# Patient Record
Sex: Female | Born: 1935 | Race: White | Hispanic: No | Smoking: Former smoker
Health system: Southern US, Community
[De-identification: ages and names within clinical notes are randomized; demographics above are authoritative.]

## PROBLEM LIST (undated history)

## (undated) DIAGNOSIS — K2970 Gastritis, unspecified, without bleeding: Secondary | ICD-10-CM

## (undated) DIAGNOSIS — E041 Nontoxic single thyroid nodule: Secondary | ICD-10-CM

## (undated) DIAGNOSIS — Z8679 Personal history of other diseases of the circulatory system: Secondary | ICD-10-CM

## (undated) DIAGNOSIS — I639 Cerebral infarction, unspecified: Secondary | ICD-10-CM

## (undated) DIAGNOSIS — J189 Pneumonia, unspecified organism: Secondary | ICD-10-CM

## (undated) DIAGNOSIS — J449 Chronic obstructive pulmonary disease, unspecified: Secondary | ICD-10-CM

## (undated) DIAGNOSIS — F419 Anxiety disorder, unspecified: Secondary | ICD-10-CM

## (undated) DIAGNOSIS — F329 Major depressive disorder, single episode, unspecified: Secondary | ICD-10-CM

## (undated) DIAGNOSIS — I10 Essential (primary) hypertension: Secondary | ICD-10-CM

## (undated) DIAGNOSIS — F32A Depression, unspecified: Secondary | ICD-10-CM

## (undated) DIAGNOSIS — R51 Headache: Secondary | ICD-10-CM

## (undated) HISTORY — PX: THYROID SURGERY: SHX805

## (undated) HISTORY — PX: OOPHORECTOMY: SHX86

## (undated) HISTORY — PX: CHOLECYSTECTOMY: SHX55

## (undated) HISTORY — PX: APPENDECTOMY: SHX54

## (undated) HISTORY — PX: HERNIA REPAIR: SHX51

---

## 1998-12-22 ENCOUNTER — Encounter: Payer: Self-pay | Admitting: Internal Medicine

## 1998-12-22 ENCOUNTER — Ambulatory Visit (HOSPITAL_COMMUNITY): Admission: RE | Admit: 1998-12-22 | Discharge: 1998-12-22 | Payer: Self-pay | Admitting: Internal Medicine

## 1999-01-03 ENCOUNTER — Observation Stay (HOSPITAL_COMMUNITY): Admission: RE | Admit: 1999-01-03 | Discharge: 1999-01-04 | Payer: Self-pay | Admitting: *Deleted

## 1999-04-04 ENCOUNTER — Other Ambulatory Visit: Admission: RE | Admit: 1999-04-04 | Discharge: 1999-04-04 | Payer: Self-pay | Admitting: Internal Medicine

## 2000-05-10 ENCOUNTER — Emergency Department (HOSPITAL_COMMUNITY): Admission: EM | Admit: 2000-05-10 | Discharge: 2000-05-10 | Payer: Self-pay | Admitting: Emergency Medicine

## 2000-05-10 ENCOUNTER — Encounter: Payer: Self-pay | Admitting: Emergency Medicine

## 2000-09-22 ENCOUNTER — Encounter: Admission: RE | Admit: 2000-09-22 | Discharge: 2000-09-22 | Payer: Self-pay | Admitting: Internal Medicine

## 2000-09-22 ENCOUNTER — Encounter: Payer: Self-pay | Admitting: Internal Medicine

## 2000-10-01 ENCOUNTER — Ambulatory Visit (HOSPITAL_COMMUNITY): Admission: RE | Admit: 2000-10-01 | Discharge: 2000-10-01 | Payer: Self-pay | Admitting: *Deleted

## 2000-10-01 ENCOUNTER — Encounter (INDEPENDENT_AMBULATORY_CARE_PROVIDER_SITE_OTHER): Payer: Self-pay | Admitting: Specialist

## 2000-11-07 ENCOUNTER — Encounter (INDEPENDENT_AMBULATORY_CARE_PROVIDER_SITE_OTHER): Payer: Self-pay | Admitting: Specialist

## 2000-11-07 ENCOUNTER — Ambulatory Visit (HOSPITAL_BASED_OUTPATIENT_CLINIC_OR_DEPARTMENT_OTHER): Admission: RE | Admit: 2000-11-07 | Discharge: 2000-11-07 | Payer: Self-pay | Admitting: *Deleted

## 2001-04-20 ENCOUNTER — Other Ambulatory Visit: Admission: RE | Admit: 2001-04-20 | Discharge: 2001-04-20 | Payer: Self-pay | Admitting: Internal Medicine

## 2007-06-02 ENCOUNTER — Encounter: Admission: RE | Admit: 2007-06-02 | Discharge: 2007-06-02 | Payer: Self-pay | Admitting: Internal Medicine

## 2008-02-04 ENCOUNTER — Encounter: Admission: RE | Admit: 2008-02-04 | Discharge: 2008-03-01 | Payer: Self-pay | Admitting: Internal Medicine

## 2008-08-24 ENCOUNTER — Inpatient Hospital Stay (HOSPITAL_COMMUNITY): Admission: EM | Admit: 2008-08-24 | Discharge: 2008-08-29 | Payer: Self-pay | Admitting: Emergency Medicine

## 2008-08-24 ENCOUNTER — Ambulatory Visit: Payer: Self-pay | Admitting: Cardiology

## 2008-08-25 ENCOUNTER — Encounter (INDEPENDENT_AMBULATORY_CARE_PROVIDER_SITE_OTHER): Payer: Self-pay | Admitting: Internal Medicine

## 2008-08-26 ENCOUNTER — Ambulatory Visit: Payer: Self-pay | Admitting: Physical Medicine & Rehabilitation

## 2008-08-26 ENCOUNTER — Encounter (INDEPENDENT_AMBULATORY_CARE_PROVIDER_SITE_OTHER): Payer: Self-pay | Admitting: Internal Medicine

## 2008-08-29 ENCOUNTER — Inpatient Hospital Stay (HOSPITAL_COMMUNITY)
Admission: RE | Admit: 2008-08-29 | Discharge: 2008-09-20 | Payer: Self-pay | Admitting: Physical Medicine & Rehabilitation

## 2008-08-29 ENCOUNTER — Ambulatory Visit: Payer: Self-pay | Admitting: Physical Medicine & Rehabilitation

## 2008-09-20 ENCOUNTER — Encounter: Payer: Self-pay | Admitting: Internal Medicine

## 2009-01-23 ENCOUNTER — Encounter: Admission: RE | Admit: 2009-01-23 | Discharge: 2009-03-16 | Payer: Self-pay | Admitting: Internal Medicine

## 2010-03-17 ENCOUNTER — Inpatient Hospital Stay (HOSPITAL_COMMUNITY): Admission: EM | Admit: 2010-03-17 | Discharge: 2010-03-21 | Payer: Self-pay | Admitting: Emergency Medicine

## 2010-03-17 ENCOUNTER — Ambulatory Visit: Payer: Self-pay | Admitting: Internal Medicine

## 2010-04-02 ENCOUNTER — Ambulatory Visit (HOSPITAL_COMMUNITY): Admission: RE | Admit: 2010-04-02 | Discharge: 2010-04-02 | Payer: Self-pay | Admitting: Internal Medicine

## 2010-09-16 HISTORY — PX: KYPHOPLASTY: SHX5884

## 2010-10-07 ENCOUNTER — Encounter: Payer: Self-pay | Admitting: Physical Medicine & Rehabilitation

## 2010-12-02 LAB — CLOSTRIDIUM DIFFICILE EIA: C difficile Toxins A+B, EIA: NEGATIVE

## 2010-12-02 LAB — URINE CULTURE: Culture: NO GROWTH

## 2010-12-02 LAB — CBC
HCT: 32.2 % — ABNORMAL LOW (ref 36.0–46.0)
HCT: 34.6 % — ABNORMAL LOW (ref 36.0–46.0)
Hemoglobin: 10.6 g/dL — ABNORMAL LOW (ref 12.0–15.0)
Hemoglobin: 11.1 g/dL — ABNORMAL LOW (ref 12.0–15.0)
MCH: 30.5 pg (ref 26.0–34.0)
MCHC: 32.7 g/dL (ref 30.0–36.0)
MCHC: 32.9 g/dL (ref 30.0–36.0)
MCV: 92.9 fL (ref 78.0–100.0)
MCV: 93.4 fL (ref 78.0–100.0)
Platelets: 155 10*3/uL (ref 150–400)
RBC: 3.45 MIL/uL — ABNORMAL LOW (ref 3.87–5.11)
RBC: 3.58 MIL/uL — ABNORMAL LOW (ref 3.87–5.11)
RDW: 12.7 % (ref 11.5–15.5)
RDW: 13.1 % (ref 11.5–15.5)

## 2010-12-02 LAB — HEPATIC FUNCTION PANEL
ALT: 17 U/L (ref 0–35)
Bilirubin, Direct: 0.2 mg/dL (ref 0.0–0.3)
Indirect Bilirubin: 0.2 mg/dL — ABNORMAL LOW (ref 0.3–0.9)
Total Protein: 6.7 g/dL (ref 6.0–8.3)

## 2010-12-02 LAB — COMPREHENSIVE METABOLIC PANEL
ALT: 16 U/L (ref 0–35)
AST: 33 U/L (ref 0–37)
Albumin: 2.7 g/dL — ABNORMAL LOW (ref 3.5–5.2)
Alkaline Phosphatase: 46 U/L (ref 39–117)
Alkaline Phosphatase: 56 U/L (ref 39–117)
BUN: 7 mg/dL (ref 6–23)
CO2: 28 mEq/L (ref 19–32)
Creatinine, Ser: 1.09 mg/dL (ref 0.4–1.2)
GFR calc non Af Amer: >60 mL/min (ref >60)
Glucose, Bld: 72 mg/dL (ref 70–99)
Potassium: 4.6 mEq/L (ref 3.5–5.1)
Potassium: 5 mEq/L (ref 3.5–5.1)
Total Protein: 6.1 g/dL (ref 6.0–8.3)

## 2010-12-02 LAB — CULTURE, BLOOD (ROUTINE X 2): Culture: NO GROWTH

## 2010-12-02 LAB — BASIC METABOLIC PANEL
BUN: 13 mg/dL (ref 6–23)
Calcium: 8.8 mg/dL (ref 8.4–10.5)
Chloride: 98 mEq/L (ref 96–112)
GFR calc Af Amer: >60 mL/min (ref >60)
GFR calc non Af Amer: 51 mL/min — ABNORMAL LOW (ref >60)
Glucose, Bld: 110 mg/dL — ABNORMAL HIGH (ref 70–99)
Potassium: 3 mEq/L — ABNORMAL LOW (ref 3.5–5.1)

## 2010-12-02 LAB — URINALYSIS, ROUTINE W REFLEX MICROSCOPIC
Hgb urine dipstick: NEGATIVE
Nitrite: NEGATIVE
Specific Gravity, Urine: 1.015 (ref 1.005–1.030)
Urobilinogen, UA: 0.2 mg/dL (ref 0.0–1.0)
pH: 6.5 (ref 5.0–8.0)

## 2010-12-02 LAB — DIFFERENTIAL
Basophils Relative: 0 % (ref 0–1)
Eosinophils Absolute: 0 10*3/uL (ref 0.0–0.7)
Eosinophils Relative: 1 % (ref 0–5)
Lymphocytes Relative: 27 % (ref 12–46)
Lymphs Abs: 1.5 10*3/uL (ref 0.7–4.0)
Neutro Abs: 3.2 10*3/uL (ref 1.7–7.7)

## 2010-12-02 LAB — STOOL CULTURE

## 2010-12-02 LAB — GIARDIA/CRYPTOSPORIDIUM SCREEN(EIA)
Cryptosporidium Screen (EIA): NEGATIVE
Giardia Screen - EIA: NEGATIVE

## 2010-12-31 LAB — BASIC METABOLIC PANEL
Calcium: 9.2 mg/dL (ref 8.4–10.5)
Creatinine, Ser: 0.93 mg/dL (ref 0.4–1.2)
GFR calc Af Amer: >60 mL/min (ref >60)
GFR calc non Af Amer: 59 mL/min — ABNORMAL LOW (ref >60)
Sodium: 137 mEq/L (ref 135–145)

## 2011-01-29 NOTE — H&P (Signed)
Erica French, Erica French NO.:  [id number]   MEDICAL RECORD NO.:  [id number]          PATIENT TYPE:  IPS   LOCATION:  4033                         FACILITY:  [facility]   PHYSICIAN:  Ellwood Dense, M.D.   DATE OF BIRTH:  04-09-1930   DATE OF ADMISSION:  08/29/2008  DATE OF DISCHARGE:                              HISTORY & PHYSICAL   PRIMARY CARE PHYSICIAN:  Dr. Earl Gala.   CARDIOLOGIST:  Dr. Mayford Knife.   HISTORY OF THE PRESENT ILLNESS:  Erica French is a 77 year old Caucasian  female with history of hypertension and temporal arteritis.  She was  admitted August 24, 2008, after a fall backwards without loss of  consciousness.  She does have a history of repeat falls over the past 6  or so months evaluated as an outpatient.  On admission, cranial CT was  done and showed no acute changes.  MRI and MRA study of the brain showed  an acute ischemic infarct in the right corona radiata extending to the  posterior half of the right putamen with inflammation of the sinuses  which was stable.  There was no stenosis or aneurysms noted.  X-rays of  the coccyx were negative for fracture.  Lumbar spine and cervical CAT  scan showed spondylolisthesis at L3-L4, L4-L5, and C4-C5.  There was  also spondylosis of the lumbar and cervical spines noted.  Neurology was  consulted for input and recommended Plavix for stroke prophylaxis.  Carotid Dopplers showed no significant internal carotid artery stenosis.  A 2D echocardiogram showed an ejection fraction of 55% to 60% with mild  mitral valve regurgitation and calcified papillary nodule.   The patient continues with left hemiparesis.  She is noted to have  dysphagia with left pocketing and is placed on a puree diet with thin  liquids.  She has complaints of suprapubic pain questionably secondary  to urinary tract infection and was recently started on ciprofloxacin for  treatment.  Her blood pressure remains labile with continuation of  Florinef and blood pressure medicines as needed.   Chest x-ray August 24, 2008, showed cardiomegaly without pulmonary  edema with healed right rib fracture and compression fractures of T7,  T8, and T9 of questionable age.   The patient was evaluated by the rehabilitation physicians and felt to  be an appropriate candidate for inpatient rehabilitation.   REVIEW OF SYSTEMS:  Positive for shortness of breath and dyspnea on  exertion, weakness, numbness, and sacral and pelvic pain.   PAST MEDICAL HISTORY:  1. Hypertension.  2. Temporal arteritis.  3. Osteoporosis.  4. TIAs.  5. Depression.  6. 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  7. Colon resection for obstruction.  8. COPD per x-rays.  9. History of syncope.  10.Prior cholecystectomy.  11.Left unilateral oophorectomy.  12.Partial thyroidectomy.   FAMILY HISTORY:  Positive for coronary artery disease.   SOCIAL HISTORY:  [place]-  level home with 3 steps to enter.  The patient's family says that they  will be checking in on her but she will be mostly by herself.  She quit  tobacco 2 to 3 years ago after 50-year pack history and does not use  alcohol.   FUNCTIONAL HISTORY PRIOR TO ADMISSION:  Independent with a cane prior to  admission, rather sedentary with balance deficits that did not improve  with short-term balance therapy in summer of 2009 and were stopped  secondary to low blood pressure.   PRESENT FUNCTIONAL STATUS:  Patient presently requires mod assist for  lower extremity, trunk, and leg movement.  She requires max the total  assist leaning to the left of her upper body, dressing, and total assist  for lower body dressing secondary to decreased balance.  She uses max  assist for grooming and mod assist for eating.  She requires mod to max  assist at present for toilet transfers and is continent of bladder.   MEDICATIONS PRIOR TO ADMISSION:  1. Florinef 0.1 mg  daily.  2. Aspirin 81 mg daily.  3. Fish oral daily.  4. Prednisone 3 mg daily.  5. Lisinopril 10 mg daily.  6. Alendronate 70 mg weekly.  7. Celexa 40 mg daily.   ALLERGIES:  1. CODEINE.  2. PROZAC.   LABORATORY:  Most recent hemoglobin was 9.2 with hematocrit of 28.1,  platelet count of 140,000, and white count of 5.3.  Most recent sodium  was 136, potassium 3.9, chloride 106, bicarbonate 24, BUN 5, creatinine  0.57, glucose of 90.  Total cholesterol was 169 with HDL cholesterol 77,  LDL cholesterol of 86, and triglycerides of 31.  Vitamin B12 was normal  at 803 with TSH of 2.144 and erythrocyte sedimentation rate 22.  Recent  hemoglobin A1c was 5.8.   PHYSICAL EXAM:  Reasonably well-appearing, thin, adult female lying in  bed in no acute discomfort.  Blood pressure was 177/86 with pulse of 89,  respiratory rate 20, and temperature 98.1.  Height 5 feet 6 inches.  She  does wear a splint on her left upper extremity.  HEENT:  Normocephalic, nontraumatic.  CARDIOVASCULAR:  Regular rate and rhythm.  S1 and S2 without murmurs.  ABDOMEN:  Soft, nontender with positive bowel sounds.  LUNGS:  Clear to auscultation bilaterally.  NEUROLOGIC:  Alert and oriented x3.  Cranial nerves II-XII are intact  with only minimal facial weakness on the left.  Patient has good  receptive and expressive abilities.  Right upper and right lower  extremity exam showed 4+/5 strength throughout.  Bulk and tone were  normal and reflexes were 2+ and symmetrical.  Left upper extremity  strength was 0 to 1/5 with a splint in place.  Sensation was decreased  to light touch in the left upper extremity compared to the right upper  extremity.  Left lower extremity exam showed hip flexion, knee  extension, and ankle dorsiflexion at 3+ to 4-/5.  Bulk and tone were  normal.   DIAGNOSES:  1. Status post right corona radiata infarct with left hemiparesis,      upper extremity worse than left lower extremity.  2.  Left facial weakness with hemisensory deficit.  3. Dysphasia.  4. Mild dysarthria.   Presently, the patient will be admitted to receive collaborative and  interdisciplinary care between the physiatrist, rehab nursing staff, and  therapy team at least 3 hours per day at least 5 days per week.  Patient's level of medical complexity and substantial therapy needs in  context of that medical necessity cannot be provided at a lesser  intensity of care.  Patient has experienced substantial functional loss  from her baseline.  Presently, she requires mod to max assist for ADLs  and transfers.  Hopefully with ongoing aggressive therapies she will  make measurable gains such that she will be able to return home probably  at min assist level.  She will probably need some 24-hour assistance at  home as I doubt she would be at modified independent level when she  first returns home.  Physiatrist will provide 24-hour management of the  medical needs, as well as oversight of the therapy plan/treatment and  provide guidance as appropriate guarding and interaction of the two.  A  24-hour rehab nursing will assist in management of bowel and bladder and  help integrate therapy concepts, techniques, and education.  Physical  therapy will assess and treat for range of motion, strengthening, bed  mobility, transfers, pre-gait training, gait training, and equipment  eval with goals of min to mod assist for ambulation and min assist for  transfers.  Occupational therapy will assess and treat for range of  motion, strengthening, ADLs, cognitive/perceptional training, splinting  and equipment eval with goals of min assist.  Speech therapy will assess  and treat for dysphagia, oral motor exercises, communication aids,  aphasia, and  VitalStim with goals of modified independent.  Case  management and social worker will assess and treat for psychosocial  issues and discharge planning as appropriate.  Team  conferences will be  held weekly to establish goals, assess progress, and determine barriers  to discharge.   ESTIMATED LENGTH OF STAY:  15 to 25 days.   PROGNOSIS:  Fair/good.   PROBLEM LIST:  1. Hypertension with continuation of Prinivil and use of Florinef at      0.1 mg daily with use of abdominal binder and adjustment of      medications as necessary to maintain adequate blood pressure but      avoid hypertension.  2. Continued anemia with monitoring of stool guaiacs and addition of      iron supplement as needed.  3. History of syncope with orthostasis, presently on Florinef with      addition of abdominal binder.  4. Questionable urinary tract infection, presently on Cipro with      sensitivities pending with change to appropriate antibiotic as      necessary.  5. Chronic obstructive pulmonary disease, on metered-dose inhaler for      dyspnea and wheezing.           ______________________________  Ellwood Dense, M.D.     DC/MEDQ  D:  08/29/2008  T:  08/29/2008  Job:  [id number]

## 2011-01-29 NOTE — H&P (Signed)
NAMESHALINI, MAIR NO.:  [id number]   MEDICAL RECORD NO.:  [id number]          PATIENT TYPE:  EMS   LOCATION:  ED                           FACILITY[facility]  [facility]   PHYSICIAN:  Michiel Cowboy, MDDATE OF BIRTH:  Sep 28, 1929   DATE OF ADMISSION:  08/24/2008  DATE OF DISCHARGE:                              HISTORY & PHYSICAL   PRIMARY CARE Clarance Bollard:  Theressa Millard, M.D.   The patient is a 77 year old female with history of degenerative disk  disease, osteoporosis, hypertension and temporal arteritis; as well as  past history of TIAs.  The patient was at her baseline of health up  until about 2:00 p.m. today.  She was standing in the kitchen preparing  lunch; she turned around to get to her chair and while turning suffered  a fall, landing on her back -- particularly on her bottom and her head.  She said when she hit the floor she was able to move everything; had no  difficulty with getting herself up.  She got up, went to fix herself  lunch, ate, and then got up again to sit down.  She then got up again to  get herself to get herself a glass of water, and on the way over all of  a sudden lost the use of her left leg -- which caused her to fall down  again.  Thereafter she could not get up.  Her left arm as well was  feeling weak; at which point her family came and brought her into the  emergency department.  Initially a code stroke was called, but since the  symptoms started at 2:30 p.m. and the patient was only here at 6 p.m.,  she was out of the window of treatment.  Neurology referred for internal  medicine to admit the patient.  Per their recommendation, will have a  stroke workup and change her aspirin to Plavix; and also order PT, OT.  I think that this is likely small vessel ischemic disease.  Given mild  symptoms, also the decision was made to not proceed to TPA.   REVIEW OF SYSTEMS:  Otherwise, the review of systems:  The patient still  feels kind of  woozy and not like herself.  She is still having slightly  weakness of her left leg. left arm and a left facial droop.  Otherwise  no nausea, no vomiting.  No chest pain or shortness of breath.  She has  a lot of back pain where she fell, otherwise unremarkable.   PAST MEDICAL HISTORY:  1. Hypertension.  2. Osteoporosis.  3. History of __________  4. Temporal arteritis.   ALLERGIES:  CODEINE, PROZAC.   MEDICATIONS:  1. Alendronate 70 mg q. Week.  2. __________ 40 mg.  3. Prednisone 3 mg daily.  4. Lisinopril 10 mg daily.  5. Midodrine 5 mg daily.  6. Aspirin 81 mg daily.  7. Fish oil one tablet daily.  8. Fludrocort 0.1 mg p.o. daily.  9. Citracal 1 mg p.o. daily.   SOCIAL HISTORY:  The patient continues to smoke one to two packs of  cigarettes per week; but does not drink.  She lives by herself.  Fully  ambulatory prior to this.   FAMILY HISTORY:  Significant for father who died of heart attack at the  age of 62.  Otherwise unremarkable.   PHYSICAL EXAMINATION:  VITAL SIGNS:  Temperature 97.9, pulse 71,  respirations 20, blood pressure initially was 192/104 and now down to  176/104.  The patient currently appears to be in no acute distress,  sitting down in bed.  HEENT:  Head -- There is a large hematoma on the occipital portion on  the right occipital portion; otherwise somewhat dry mucous membranes.  LUNGS:  Somewhat distant breath sounds bilaterally, with occasional  crackles.  HEART:  Regular rate and rhythm.  No murmurs, rubs or  gallops.  ABDOMEN: Soft, nontender, nondistended.  LOWER EXTREMITIES:  Without clubbing, cyanosis or edema.  MUSCULOSKELETAL:  Tenderness over coccyx noted.  NEUROLOGIC:  Strength 5/5 on the right and 4/5 on the left; with mild  hand drift and also diminished hand to eye coordination on the left.  There was also mild left facial droop.  Cranial nerves otherwise intact.   LABS:  White blood cell count 7.6, hemoglobin 11.7.  Sodium 137,   potassium 4.0, creatinine 1.64, calcium 9.6.   STUDIES:  CT scan of the head showing no acute intracranial changes,  atrophy and chronic ischemic changes noted.  There is a right parietal  scalp contusion noted.  The CT scan of the neck with no fractures.  Lumbar spine showing degenerative changes, but otherwise no fractures.   EKG:  Not obtained.   CHEST X-RAY:  Not obtained.   ASSESSMENT AND PLAN:  This is a 77 year old female with a history of  transient ischemic attacks, hypertension, and temporal arteritis; who  presents with a sudden left-sided weakness and left facial droop.  According to neurology, likely cerebrovascular accident.  We will admit  for further observation and evaluation.   1. Likely cerebrovascular accident:  We will do cerebrovascular      accident workup, including MRI, MRA 2-D echocardiogram, carotid      Dopplers, homocysteine level, fasting lipid panel, hemoglobin A1c.      Will switch to Plavix.  2. Hypertension:  Will avoid over treatment currently, as the patient      is still within 24 hours after potential cerebrovascular accident .      Will decrease dose of lisinopril and give p.r.n. hydralazine for      systolics above 180.  3. Status post Fall:  Sounds like it was mechanical fall due to losing      balance when she turned around.  The patient did not lose      consciousness, but did have head trauma.  She is currently still      kind of feeling woozy, and this could be either related to her      stroke or concussion.  She will need PT and OT evaluation.  We will      do fall precautions.  Evaluate coccyx for possible fractures, as      this patient still has pain and tenderness.  4. Slightly Elevated Creatinine:  Will give gentle IV hydration and      follow.  5. History of Temporal Arteritis:  Will continue prednisone.  6. History of Depression:  Continue citalopram.  7. Prophylaxis:  Protonix plus SCDs.   The patient is Full Code, as per  wishes of patient and her family.  Michiel Cowboy, MD  Electronically Signed     AVD/MEDQ  D:  08/24/2008  T:  08/24/2008  Job:  [id number]   cc:   Theressa Millard, M.D.  Fax: [phone]

## 2011-01-29 NOTE — Discharge Summary (Signed)
[name], [name] NO.:  [id number]   MEDICAL RECORD NO.:  [id number]          PATIENT TYPE:  INP   LOCATION:  1423                         FACILITY:  [facility]   PHYSICIAN:  Theressa Millard, M.D.    DATE OF BIRTH:  July 02, 1930   DATE OF ADMISSION:  08/24/2008  DATE OF DISCHARGE:  08/29/2008                               DISCHARGE SUMMARY   ADMITTING DIAGNOSIS:  Stroke.   DISCHARGE DIAGNOSES:  1. Stroke with subcortical stroke in the right corona radiology      extending into the posterior half of the right putamen, left arm      plegia, left leg paresis, and left facial droop as a result.  2. History of temporal arteritis with normal sedimentation rate.  3. Probable urinary tract infection.  4. History of orthostatic hypotension, treated with fludrocortisone      and midodrine.  5. Supine hypertension, treated with lisinopril 10 mg daily, now on      hold.  6. Osteoporosis, status post 2 years of Forteo, currently on      alendronate.   The patient is a 77 year old white female who fell on the day of  admission.  She thought this was due to orthostatic hypotension.  She  struck the right occiput and fell on the coccyx as well.  She sat for  several hours and thought she was fine but when she got up to walk again  she realized that her left leg was weak.  She came to the emergency  department.  In the emergency department, she was seen by neurology and  medicine.  Neurology noted that she had some mild left facial droop,  thought that she probably had a subcortical stroke that was small in  extent and did not recommend TPA.  It was their feeling that because of  the patient's head trauma, subcortical location of her neurologic  deficit, and other issues, that it would be best not to consider TPA  with the dangers probably outlying benefits.  Unfortunately, over night,  she worsened and developed plegia of the left arm and severe paresis of  the left leg.  She  developed a worsening left facial droop.  Over the  subsequent days, her left arm continued to be extremely weak but she did  regain some function in her left leg.  The left facial droop remained.  Modified barium swallow revealed some difficulties with thin liquids and  pills so she was trained in eating safely in that regard.  She was  treated with aspirin.  Her lisinopril was held in order to allow her  blood pressure to maintain itself in the 160 to 170 range.  She did not  have problems with orthostatic hypotension but she never really did get  out of bed.   She is transferred to the comprehensive inpatient rehab unit for  complete and thorough rehabilitation.  Patient has a very supportive  family including 4 daughters who were involved in her care during the  hospitalization.  She should do very well in rehab and regain  considerable functional status.  The patient had a Foley catheter for several days.  This was  discontinued.  She had urinary frequency and suprapubic pain.  Urinalysis was negative but she improved on Cipro while the urinalysis  was being done.  Therefore, she will be treated for 5 days with Cipro.   DISCHARGE MEDICATIONS:  1. Alendronate 70 mg weekly.  2. Citalopram 40 mg daily.  3. Prednisone 3 mg daily.  4. Fludrocortisone 0.1 mg daily.  5. Midodrine 2.5 mg b.i.d.  6. Citracal twice daily.  7. Aspirin 81 mg daily.  8. Fish oil 1000 mg daily.  9. Cipro 500 mg b.i.d. x4 days.      Theressa Millard, M.D.  Electronically Signed     JO/MEDQ  D:  08/29/2008  T:  08/29/2008  Job:  [id number]

## 2011-01-29 NOTE — Consult Note (Signed)
Erica French, CARUSONE NO.:  [id number]   MEDICAL RECORD NO.:  [id number]          PATIENT TYPE:  EMS   LOCATION:  ED                           FACILITY:  [facility]   PHYSICIAN:  Levert Feinstein, MD               DATE OF BIRTH:   DATE OF CONSULTATION:  08/24/2008  DATE OF DISCHARGE:                                 CONSULTATION   REFERRING PHYSICIAN:  Carleene Cooper, M.D.   CHIEF COMPLAINT:  1. Fall.  2. Left-sided weakness.   HISTORY OF PRESENT ILLNESS:  The patient is a 77 year old right-handed  Caucasian female, with 2 daughters at the bedside.  She has a past  medical history of orthostatic hypotension, frequent falls since last  year, hypertension and osteoporosis.  A previous history of temporal  arteritis, on tapering dose of prednisone.  She presented with a fall x2  this afternoon.   The patient was alone at home this afternoon.  About 2:15 pm, she got up  from seated position, walked towards her cabinet; without any warning  signs, she fell backwards.  Without loss of consciousness, but she was  able to get up without lateralized motor or sensory deficit.  She  finished her lunch.  When she got up to put her dish into the kitchen,  she fell again.  This time no loss of consciousness, but she has  difficulty getting up.  She complains of severe low back pain.  She had  difficult time bearing weight with her left leg and mild slurred speech.  She was able to manage to call her daughter, who later took her to the  emergency room.   Symptoms have persisted since onset.  The family noticed mild drooping  on the left face;   She described this fall accident was different from previous hypotensive  episodes.  With her orthostatic hypotensive episodes she would have  warning signs, dizziness and woozy sensation, vision blackout with  transient loss of consciousness.  This is totally different.  She  complains of severe low back pain, mild numbness on the left leg  and  foot; but, no numbness on the left trunk, arm or face.  She denies urine  or bowel incontinence or pelvic area numbness.   REVIEW OF SYSTEMS:  Pertinent as above.   PAST MEDICAL HISTORY:  1. Hypertension.  2. Orthostatic hypotension.  3. Osteoporosis.  4. Previous history of temporal arteritis, on chronic prednisone      treatment for years.   PAST SURGICAL HISTORY:  None.   FAMILY HISTORY:  Noncontributory.   SOCIAL HISTORY:  She lives alone.  Quit driving since last year.  Sedentary lifestyle due to frequent orthostatic symptoms.  She was not  able to tolerate PT, OT previously.  She smokes occasionally.  Denied  drinking.   CURRENT HOME MEDICATIONS:  1. Alendronate 70 mg.  2. Celexa 40 mg once a day.  3. Prednisone 3 mg once a day.  4. Lisinopril 10 mg once a day.  5. Midodrine 5 mg once a day.  6. Aspirin  81 mg once a day.  7. Fish oil one tablet.  8. Fludrocortisone once a day.  9. Citracal calcium.   PHYSICAL EXAMINATION:  She is a pleasant thin lady; awake, alert,  oriented to history.  Taking care of conversation.  CARDIAC:  Regular rate and rhythm.  PULMONARY:  Clear to auscultation bilaterally.  NECK:  Supple.  No carotid bruits.   NEUROLOGIC EXAMINATION:  She is alert, oriented to history and taking  care of conversation.  Cranial nerves II-XII.  Pupil equal, round,  reactive to light.  Left fundi were sharp.  Extraocular movements were  full.  Visual fields were full on confrontational test.  Facial  sensation was normal.  She has mild left lower face weakness.  The  uvula, tongue midline.  Head turning and shoulder shrugging were normal  and symmetric.   SENSORY:  Intact to light touch, pinprick, vibratory sensation and  proprioception on 4 extremities.  Deep tendon reflexes were brisk and  symmetric, including bilateral act as reflex.  Plantar responses were  flexor.  MOTOR EXAMINATION:  She has mild drifting of the left upper and  lower  extremity, but strength is at least 5- out of 5.  COORDINATION:  She has mild clumsiness on the left upper and lower extremity on finger-  to-nose, heel-to-shin; proportional to weakness and gait.  She needs  people assistance to get up from a seated position; complains of severe  low back pain, veering to the left side.  Difficulty initiating gait,  especially initiate left side; but when she sits still there was no  trunk titubation.   On CT of the brain without contrast there was mild periventricular white  matter disease.  No acute change.   Also, she complains of low back sacral area tenderness and bilateral  paraspinal muscle tenderness.   ASSESSMENT AND PLAN:  A 77 year old female presenting with acute fall,  with acute left leg motor and arm and face weakness.  Likely this  indicates acute small vessel ischemic disease involving right internal  capsule or subcortical area.  She is at 4 hour 20 minute window now.  I  discussed with the family, and she has mild weakness on left side.  Her  gait limitations  are likely a combination of left leg weakness and  severe low back pain.  We decided not to proceed with IV TPA.  She will  be admitted to the hospital for stroke workup including:   1. MRI of the brain with and without contrast.  2. Ultrasound of carotid artery.  3. Echocardiogram.  4. A stroke lab evaluation, including fasting lipid profile, A1c,      thyroid function CMP, CBC, B12, homocystine      and basic vasculitis workup.  5. Physical therapy and occupational therapy.  6. X-ray of the low back.      Levert Feinstein, MD  Electronically Signed     YY/MEDQ  D:  08/24/2008  T:  08/25/2008  Job:  [id number]

## 2011-01-29 NOTE — Discharge Summary (Signed)
Erica French, Erica French NO.:  [id number]   MEDICAL RECORD NO.:  [id number]          PATIENT TYPE:  IPS   LOCATION:  4033                         FACILITY:  [facility]   PHYSICIAN:  Ellwood Dense, M.D.   DATE OF BIRTH:  12-23-1929   DATE OF ADMISSION:  08/29/2008  DATE OF DISCHARGE:  09/20/2008                               DISCHARGE SUMMARY   DISCHARGE DIAGNOSES:  1. 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  2. Hypertension.  3. History of syncope with orthostasis.  4. Chronic obstructive pulmonary disease.  5. Gait disorder.  6. Temporal arteritis.  7. Recurrent hypokalemia, resolved.   HISTORY OF PRESENT ILLNESS:  Erica French is a 77 year old female with  history of hypertension and temporal arteritis admitted December 2009  with fall backwards without loss of consciousness.  CT of head done  showed no acute abnormality.  MRI/ MRA of brain showed acute ischemic  infarct in right corona radiata to posterior half of right putamen,  inflammatory disease of sinus which was noted to be stable, no stenosis  or aneurysm noted.  X-rays of coccyx were negative for fractures, L  spine and cervical spine.  CT shows spondylolisthesis L3-L4, L4-L5 and  C4-C5 with spondylitic lumbar and cervical spine.  Neuro was consulted  for input and recommended Plavix for CVA prophylaxis.  Carotid Dopplers  done showed no ICA stenosis.  Cardiac echocardiogram done showed EF of  55-60% with mild mitral valve regurgitation and calcified papillary  muscle.  Currently, the patient continues with left hemiparesis, noted  to have dysphasia with less pocketing, and is on pureed diet with thin  liquids.  She did have some complaints of suprapubic pain with question  of UTI and was started on Cipro for treatment.  Currently, the patient's  blood pressures remained labile.  ACE was added for better blood  pressure management.  The patient also noted to be anemic with H and H  at 9.2 and 28.1.  The patient was evaluated by rehab physicians and felt  to be an appropriate candidate for inpatient rehab.   PAST MEDICAL HISTORY:  Significant for:  1. Hypertension.  2. Temporal arteritis.  3. Osteoporosis.  4. TIAs.  5. Depression.  6. Gait disorders with fall.  7. History of colon obstruction with requiring resection.  8. COPD.  9. History of syncope.  10.Left unilateral oophorectomy.  11.Partial thyroidectomy.  12.Cholecystectomy.   ALLERGIES:  To CODEINE and PROZAC.   FAMILY HISTORY:  Is positive for coronary artery disease.   SOCIAL HISTORY:  The patient lives alone in [place], [place], in  one-level home with 3 steps at entry.  Has family that lives nearby.  She quit tobacco 2-3 years ago.  Has 50+ pack-year history.  Does not  use any alcohol.  Family can check in past discharge.   FUNCTIONAL HISTORY:  The patient was independent with cane prior to  admission, is sedentary.  Does not drive.  Balance therapy was attempted  in the Summer of 2009, however, limited  due to the patient's  orthostasis.   FUNCTIONAL STATUS:  OT is working with range of motion left upper  extremity and reinforcement with education of the use of left upper  extremity splint.  The patient is max to total assist for ADLs, mod to  max assist to go from sitting to supine in bed.   PHYSICAL EXAMINATION:  The patient is thin, well-nourished, well-  developed female with left hemiparesis and left facial droop, no acute  distress.  HEENT:  Atraumatic, normocephalic.  Extraocular movements intact.  Nares  patent.  Tongue midline.  NECK:  Is supple without masses or JVD.  LUNGS:  Are clear to auscultation bilaterally without rhonchi.  Audible  upper airway wheezing noted.  HEART:  Was regular rate rhythm without murmurs or gallops.  ABDOMEN:  Is soft, nontender with positive bowel sounds.   MUSCULOSKELETAL:  No edema or peripheral cyanosis noted.  SKIN:  Is intact without evidence of breakdown.  NEUROLOGIC:  Left facial droop with left hemiparesis, dense left upper  extremity flaccid paralysis with left lower extremity weakness -  distally greater than proximally.  The patient's left upper extremity  strength is 0-1/5 with splint in place.  Sensation decreased to light  touch in left upper extremity compared to right upper extremity.  Left  lower extremity exam shows hip flexion, knee extension at 3 and ankle  dorsiflexion at 3+ to 4- out of 5; bulk and tone normal.  The right  upper extremity/right lower extremity strength is at 4+/5 with normal  bulk and tone.  The patient has good receptive and expressive abilities.  The patient is able to follow one and two-step commands without  difficulty.  Some mild anxiety noted.   HOSPITAL COURSE:  [name] was admitted to rehab on August 29, 2008, for inpatient therapies to consist of PT, OT and speech  therapy at least 3 out of 5 days a week.  Past admission, physiatry,  rehab R.N., and therapy team have worked together to provide customized  collaborative interdisciplinary care.  They have monitored the patient  with weekly team conferences to discuss the patient's progress, set  goals, as well as discuss barriers to discharge.  Rehab R.N. has been  working with the patient on bowel and bladder training with toileting  patient q.3 h. for prevention of incontinence.  They have also been  focusing with the patient in reinforcement of the placement of left  upper extremity for helping prevent subluxation of left shoulder and  also been encouraging the patient with p.o. intake.  The patient's blood  pressures have been monitored b.i.d. basis closely with orthostatic  checks due to the patient's history of syncope.  The patient's blood  pressures were noted to drop with standing with supine blood pressure at  150/79  prior to medications.  After medications, supine BP was noted to  be at 121/75 with standing blood pressure at 101/62 with heart rates  elevating from 100 to 113.  The patient was noted to have some shortness  of breath with activity.  However, O2 saturations were noted be at 95%  on room air.  The patient's voiding was monitored with PVR checks, and  the patient was noted be voiding without any signs of retention.  Secondary to issues with orthostasis, an abdominal binder was ordered to  help maintain to the patient's blood pressure.  Florinef has also been  used q.a.m. if systolic blood pressure is less than 160  when supine.  The patient did report not having had a bowel movement for at least a  week since hospitalization.  A bowel program was initiated.  The patient  did receive sorbitol past admission on December 14 which caused some  diarrhea.  Bowel program was set up initially with Senokot-S 2 p.o.  b.i.d.  This was noted to be too much with the patient having some bowel  urgency.  She has had intermittent  issues with constipation, and she is  to continue with Senna S 2 p.o. every night if no BM that day.  Speech  therapy has been following with the patient for diet tolerance with  dysphasia treatment.  The patient was to advanced to D2 chopped diet  with thin liquids on December 14.  Medications have been used whole in  puree.  As the patient's swallow has improved, she was further advanced  to D3 diet with thin liquids on December 22.  She is currently requiring  intermittent supervision at meals.  The patient's labs were checked past  admission showing the patient to have hypokalemia with potassium at 3.3.  She was supplemented with K-Dur.  Additionally, the patient reported  issues with chronic pain due to arthritis.  She was started on Ultram 50  mg p.o. t.i.d. to help with this.  The patient's urine culture came back  without growth.  Therefore, Cipro was deceased on 09/22/2023.  Routine  checks of electrolytes have been done during this stay to monitor the  patient's hypokalemia.  Recheck electrolytes from December 30 revealed  sodium 137, potassium 3.7, chloride 103, CO2 27, BUN 8, creatinine 0.84,  glucose 84.  A CBC on December 30 reveals hemoglobin 10.9, hematocrit  32.4, white count 4.4, platelets 259.  The patient's mood has been  monitored along on Celexa.  High levels of anxiety are much improved by  time of discharge.  The patient's blood pressures continued to be  monitored on b.i.d. basis.  She is noted to be high in a.m. with blood  pressure in 160s in a.m., especially prior to her a.m. medications.  Low-  dose hydrochlorothiazide 12.5 mg was added on January 30.  However, with  addition of hydrochlorothiazide, the patient was noted to drop to 100/60  in therapy with complaints of dizziness on January 4.  The patient's  hydrochlorothiazide was discontinued.  Recommendations are to monitor  the patient's blood pressures in a.m. at supine and while standing prior  to attempting tight control of blood pressures secondary to her history  of orthostasis as well as syncope.   At time of admission, OT evaluation was limited in part due to  orthostasis.  An abdominal binder was ordered to help with her  symptomatology.  OT evaluation notes the patient at mod assist for bed  mobility with decrease in sitting balance with falling to the right,  decrease in truncal stability, problems with left body awareness.  OT  has focused with education on hemi bathing.  Transfer was attempted for  sitting to standing with decreased activation in left lower extremity  with the right knee buckling, with increase in shortness of breath,  decrease in activity tolerance, decrease in frustration tolerance.  The  patient was at total assist for toilet transfer and required increase  max assist with bed mobility with increase in fatigue.  OT has been  focusing with the  patient using hemi-dressing techniques with focus on  truncal support as well as challenge  in dynamic sitting balance at the  edge of bed.  They have also been working with the patient in [employer] with focus on using left upper extremity stabilization and  preventing or decreasing right over compensation.  OT is also working  with the patient on attending to the right side with cuing for memory  strategies for carryover.  Family education was done with family on  December 30 to include issues regarding self-care, standing balance for  lower body clothing management, left upper extremity management and care  as well as shower and toilet transfers.  Physical therapy evaluation at  time of admission revealed the patient with poor posture on standing  with inability to use left upper extremity functionally.  She was max  assist for static sitting with falling posterior and to left with  difficulty recovering and shortness of breath.  She was mod assist to  roll to the right, close supervision to roll to the left, was able to  perform stand pivot transfers with max assist with unable to step on  lower extremity.  She was able to propel her wheelchair 90 feet with 2  rest breaks with close supervision to min assist over a slight lip in  the wall.  Had difficulty staying straight requiring verbal cues for  navigation.  PT has been working with the patient with endurance issues  as well as lower extremity exercises for strengthening, left lower  extremity with mini squats with upper extremity support on bench in  front, facilitation to activity left knee extension as well as  maintaining postural control.  New step has been use with lower  extremity only for strengthening and endurance with the patient able to  perform 5 minutes with multiple rest breaks.  Family education was done  with patient and family with basic transfers with cuing for techniques  and sequencing with the patient  requiring min assist for scoot pivot  transfers.  The patient and family was trained on navigating 3 stairs  with min assist and min to mod assist for car transfers.  At time of  discharge, the patient has progressed along to being at [facility] assist for  sit to stand from wheelchair, able to ambulate 25 feet with handheld  assist with mod assist as she tends to lean to the left with occasional  scissoring of left lower extremity.  She is able to navigate 5 stairs  with 1 rail with mod assist.  She was noted to have a presyncopal  episode with physical therapy on January 4 with fatigue with the patient  requiring frequent rest breaks during the session.  The patient was at  [facility] assist for toilet transfers.  Speech therapy has been working with  the patient on oral motor and pharyngeal strengthening exercises.  They  have also been working with the patient on dysphagia training with  [employer] to see about the patient's tolerance of regular consistency  as well as the patient's ability to utilize swallow strategies for safe  swallow precautions.  They have been monitoring the patient for increase  in divided attention during ADL and transfers for safety and efficiency  of the patient's ability to follow and complete tasks with minimal  cuing.  Initially, the patient required min questioning cues to perform  poor motor exercises.  She was 90-100% accurate with min cuing for  accuracy for complex level tasks with complex problem solving and  reasoning.  Currently, the patient is tolerating  p.o. without  difficulty, following precautions with supervision.  She is able to  perform tasks with moderate distractions with family without requiring  cues to attend for complex reasoning tasks.  After the patient's family  education, family expressed concerns about the amount of care that would  be required.  The patient and family felt that SNF would be the best  option now prior to help transition to  home.  Bed is available at  [facility] for January 5, and the patient is to be discharged to  this facility with progressive PT, OT, speech therapy, continuing past  discharge.   DISCHARGE MEDICATIONS:  1. Prinivil 10 mg p.o. per day.  2. Florinef 0.1 mg, hold if systolic blood pressure is greater than      160.  3. Celexa 40 mg p.o. per day.  4. Protonix 40 mg p.o. per day.  5. Plavix 75 mg per day. 6. Prednisone 3 mg p.o. per day.  7. Combivent inhaler 1 puff b.i.d.  8. Caltrate 950 mg b.i.d.  9. Eucerin cream to bilateral lower extremity b.i.d.  10.K-Dur 20 mEq per day.  11.Senokot-S 2 p.o. every night if no BM that day.  12.Albuterol nebulizers q.i.d. p.r.n. shortness of breath.  13.Atrovent nebulizers q.i.d. p.r.n. shortness of breath, wheezing.  14.Maalox 30 mL p.o. q.4 h. p.r.n. indigestion.  15.Oxycodone 5-10 mg p.o. q.6 h. p.r.n. severe pain.  16.Ultram 50 mg p.o. t.i.d. p.r.n. pain.  17.Robaxin 500 mg p.o. q.i.d. p.r.n. spasm.  18.Artificial Tears 1 GGT q.i.d. p.r.n.   DIET:  Is a regular with intermittent supervision.  Medications whole  with puree, no straws.   ACTIVITY LEVEL:  Is 24-hour supervision with assistance for mobility.  Continue monitoring the patient for orthostatic changes.  Abdominal  binder when out of bed.  Check blood pressures while sitting as well as  lying and standing prior to adjusting and increasing BP medications.   SPECIAL INSTRUCTIONS:  Progressive PT, OT, speech therapy to continue  past discharge.  LMD to monitor for BP medication changes.   FOLLOW UP:  The patient to follow up with Dr. Thomasena Edis in 4-6 weeks.  Follow up with L and D for medical issues.  Follow up with Dr. Pincus Sanes,  neurology, in 4 weeks.      Greg Cutter, P.A.    ______________________________  Ellwood Dense, M.D.    PP/MEDQ  D:  09/19/2008  T:  09/19/2008  Job:  [id number]   cc:   Theressa Millard, M.D.  Maple Hudson, M.D.  Mayford Knife, M.D.

## 2011-04-19 ENCOUNTER — Other Ambulatory Visit: Payer: Self-pay | Admitting: Internal Medicine

## 2011-04-19 ENCOUNTER — Inpatient Hospital Stay (HOSPITAL_COMMUNITY)
Admission: EM | Admit: 2011-04-19 | Discharge: 2011-04-23 | DRG: 516 | Disposition: A | Discharge status: Skilled Nursing Facility | Payer: Medicare Other | Attending: Internal Medicine | Admitting: Internal Medicine

## 2011-04-19 ENCOUNTER — Emergency Department (HOSPITAL_COMMUNITY): Payer: Medicare Other

## 2011-04-19 DIAGNOSIS — IMO0002 Reserved for concepts with insufficient information to code with codable children: Secondary | ICD-10-CM

## 2011-04-19 DIAGNOSIS — Z993 Dependence on wheelchair: Secondary | ICD-10-CM

## 2011-04-19 DIAGNOSIS — M810 Age-related osteoporosis without current pathological fracture: Secondary | ICD-10-CM | POA: Diagnosis present

## 2011-04-19 DIAGNOSIS — M8448XA Pathological fracture, other site, initial encounter for fracture: Principal | ICD-10-CM | POA: Diagnosis present

## 2011-04-19 DIAGNOSIS — M316 Other giant cell arteritis: Secondary | ICD-10-CM | POA: Diagnosis present

## 2011-04-19 DIAGNOSIS — I10 Essential (primary) hypertension: Secondary | ICD-10-CM | POA: Diagnosis present

## 2011-04-19 DIAGNOSIS — M545 Low back pain, unspecified: Secondary | ICD-10-CM

## 2011-04-19 DIAGNOSIS — Z79899 Other long term (current) drug therapy: Secondary | ICD-10-CM

## 2011-04-19 DIAGNOSIS — F3289 Other specified depressive episodes: Secondary | ICD-10-CM | POA: Diagnosis present

## 2011-04-19 DIAGNOSIS — J4489 Other specified chronic obstructive pulmonary disease: Secondary | ICD-10-CM | POA: Diagnosis present

## 2011-04-19 DIAGNOSIS — I672 Cerebral atherosclerosis: Secondary | ICD-10-CM | POA: Diagnosis present

## 2011-04-19 DIAGNOSIS — J449 Chronic obstructive pulmonary disease, unspecified: Secondary | ICD-10-CM | POA: Diagnosis present

## 2011-04-19 DIAGNOSIS — Z66 Do not resuscitate: Secondary | ICD-10-CM | POA: Diagnosis present

## 2011-04-19 DIAGNOSIS — I69959 Hemiplegia and hemiparesis following unspecified cerebrovascular disease affecting unspecified side: Secondary | ICD-10-CM

## 2011-04-19 DIAGNOSIS — F329 Major depressive disorder, single episode, unspecified: Secondary | ICD-10-CM | POA: Diagnosis present

## 2011-04-19 LAB — BASIC METABOLIC PANEL
BUN: 21 mg/dL (ref 6–23)
Chloride: 100 mEq/L (ref 96–112)
Creatinine, Ser: 0.87 mg/dL (ref 0.50–1.10)
GFR calc Af Amer: >60 mL/min (ref >60)
GFR calc non Af Amer: >60 mL/min (ref >60)
Potassium: 5.1 mEq/L (ref 3.5–5.1)

## 2011-04-19 LAB — TYPE AND SCREEN
ABO/RH(D): O POS
Antibody Screen: NEGATIVE

## 2011-04-19 LAB — DIFFERENTIAL
Basophils Absolute: 0 10*3/uL (ref 0.0–0.1)
Basophils Relative: 0 % (ref 0–1)
Lymphocytes Relative: 21 % (ref 12–46)
Neutro Abs: 5.1 10*3/uL (ref 1.7–7.7)
Neutrophils Relative %: 69 % (ref 43–77)

## 2011-04-19 LAB — CBC
HCT: 35.7 % — ABNORMAL LOW (ref 36.0–46.0)
Hemoglobin: 11.6 g/dL — ABNORMAL LOW (ref 12.0–15.0)
MCHC: 32.5 g/dL (ref 30.0–36.0)
WBC: 7.4 10*3/uL (ref 4.0–10.5)

## 2011-04-19 NOTE — H&P (Signed)
Erica French, Erica French NO.:  [id number]  MEDICAL RECORD NO.:  [id number]  LOCATION:  [facility]                         FACILITY:  [facility]  PHYSICIAN:  Rockey Situ. Flavia Shipper., M.D.DATE OF BIRTH:  06/06/1930  DATE OF ADMISSION:  04/19/2011 DATE OF DISCHARGE:                             HISTORY & PHYSICAL   History was available from the patient and her family members in attendance.  She was examined in the emergency room hallway in bed #11.  This is one of several  hospitalizations for this 77 year old woman admitted from an assisted living facility following fall a week ago leading to compression fractures of her lumbar spine.  She is admitted for orthopedic evaluation and pain control.  HISTORY OF PRESENT ILLNESS:  Erica French was in her usual state of ill health until last week when she was been assisted to the bathroom.  She fell against the wall and had pain.  Initial x-rays were nondiagnostic. However, with continued pain she was admitted to the ER today where a CT scan evidenced acute inferior end plate fracture at L2 with retropulsion of 4 mm.  There was also acute anterior superior end plate fracture at L3 with loss of height of 20%, no retropulsion at this level.  She has chronic degenerative changes L3-L4 with multifactorial stenosis, worse on the right.  She has chronic degenerative changes at L4-5 with lateral recess and foraminal narrowing, left worse than right.  In addition, she has evidence of significant atherosclerosis on this x-ray.  PAST MEDICAL HISTORY:  She was admitted to [facility] on July 2001, where it was determined that she had abdominal pain, diarrhea, hypertension, temporal arteritis, a history of a cerebrovascular accident with left hemiparesis, chronic obstructive lung disease, status post cholecystectomy, status post partial thyroidectomy, status post left unilateral oophorectomy and depression.  It was at that  time that she went from home to nursing home care.  In January 2010, she suffered a right corona radiata infarct with left hemipareses, left facial weakness and left hemisensory deficits in attention to the left.  ALLERGIES:  She is allergic to [facility] which causes her to break out in hives.  She is allergic to CODEINE which causes her to break out in hives.  CURRENT MEDICATIONS:  According to the nursing home medication reconciliation include: 1. Plavix 75 mg 1 tablet daily. 2. Citalopram 20 mg 1 tablet daily. 3. Acetaminophen 325 mg every 6 hours. 4. Loperamide 2 mg p.o. 2 tablets daily as needed. 5. Oxycodone 5 mg p.o. every 4 hours as needed. 6. Florastor which is Saccharomyces boulardii 1 cap twice daily. 7. Acetaminophen 500 mg p.o. 2 tablets daily at bedtime. 8. Senna docusate 6.5/50 mg p.o. daily at bedtime. 9. Potassium chloride 1 tablet daily. 10.Oyster Shell Calcium 500 mg daily. 11.Magnesium oxide one daily.  12.She is also on 1 mg of prednisone 3 tablets daily.     All medications will be continued except for the     Saccharomyces boulardii since there may be fungal sepsis     with that medication.  No diabetes, hypertension, rheumatic fever, MI.  She has had a CVA within the last 2 years.  PERSONAL HISTORY:  Smoking 1 pack per day since age 3, she quit with a stroke, however, EtOH is denied.  No travel outside the country.  She has a dog at the nursing home.  She has 4 children, 5 grandchildren and 2 great-grandchildren.  Her husband died of suicide in 15.  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  PHYSICAL EXAMINATION:  VITAL SIGNS:  In the emergency room earlier this afternoon, temperature 97.9, pulse was 85 and regular, blood pressure 139/86, respirations 20, pulse ox was 93%. GENERAL:  Generally, well-developed, well-nourished woman in no apparent distress, except for some twinges of back pain, appearing her stated age. HEENT:   Normocephalic without trauma.  Hair and scalp within normal limits.  Eyes:  Lids within normal limits.  CNS:  Clear.  EOMs intact. PERRLA.  EARS:  Pinna  within normal limits.  Nose:  Septum and mucosa within normal limits.  Mouth:  There are some missing teeth.  Other teeth appear to be intact and not in need of repair immediately. Mucosa:  Posterior pharynx within normal limits. NECK:  Benign with well healed surgical scar consistent with thyroidectomy. CHEST:  Clear anteriorly. ( I did not want to move her given her recent back injury.) CARDIAC:  Normal without murmurs, gallops, arrhythmias or rubs. ABDOMEN:  Soft.  Bowel sounds are present.  No masses, no tenderness. NEURO:  She does have an evident left hemiparesis.  Her left hand does have contractures.  LABS AND DIAGNOSTICS:  CT scan as noted above was reviewed with the radiologist.  She has already received some morphine for pain.  White count was normal.  Hemoglobin was 11.6 mildly low, platelet count was normal.  Sodium 136, potassium 5.1, chloride 100, CO2 31, glucose was mildly elevated at 107, creatinine was normal at 0.87 and she has already been typed and screened for blood with O positivity.  So in summary, this elderly woman is being admitted to the hospital for pain management and support for pain while she undergoes Orthopedic evaluation and possible vertebroplasty.  In addition will continue IV hydration with half normal saline and check chemistries in the morning.  We will use compression stockings given potential need for surgery and plavix on board.    Discussion with the patient and family about her end-of-life desires elicited with for DNR.   Rockey Situ. Flavia Shipper., M.D.     ENR/MEDQ  D:  04/19/2011  T:  04/19/2011  Job:  [id number]  Electronically Signed by Lenn Sink M.D. on 04/19/2011 10:59:09 PM

## 2011-04-20 LAB — CBC
MCH: 30.1 pg (ref 26.0–34.0)
MCV: 93.2 fL (ref 78.0–100.0)
Platelets: 251 10*3/uL (ref 150–400)
RBC: 3.66 MIL/uL — ABNORMAL LOW (ref 3.87–5.11)
RDW: 12.7 % (ref 11.5–15.5)
WBC: 5.7 10*3/uL (ref 4.0–10.5)

## 2011-04-20 LAB — BASIC METABOLIC PANEL
Calcium: 9.5 mg/dL (ref 8.4–10.5)
Creatinine, Ser: 0.69 mg/dL (ref 0.50–1.10)
GFR calc Af Amer: >60 mL/min (ref >60)
Sodium: 137 mEq/L (ref 135–145)

## 2011-04-20 LAB — ABO/RH: ABO/RH(D): O POS

## 2011-04-20 LAB — TSH: TSH: 2.109 u[IU]/mL (ref 0.350–4.500)

## 2011-04-21 LAB — PROTIME-INR: Prothrombin Time: 12.5 seconds (ref 11.6–15.2)

## 2011-04-22 ENCOUNTER — Inpatient Hospital Stay (HOSPITAL_COMMUNITY): Payer: Medicare Other

## 2011-04-23 LAB — CBC
HCT: 35.5 % — ABNORMAL LOW (ref 36.0–46.0)
Hemoglobin: 11.4 g/dL — ABNORMAL LOW (ref 12.0–15.0)
MCH: 30 pg (ref 26.0–34.0)
MCHC: 32.1 g/dL (ref 30.0–36.0)
MCV: 93.4 fL (ref 78.0–100.0)
RDW: 12.5 % (ref 11.5–15.5)

## 2011-04-23 LAB — BASIC METABOLIC PANEL
BUN: 19 mg/dL (ref 6–23)
Calcium: 9.7 mg/dL (ref 8.4–10.5)
Creatinine, Ser: 0.93 mg/dL (ref 0.50–1.10)
GFR calc non Af Amer: 58 mL/min — ABNORMAL LOW (ref >60)
Glucose, Bld: 121 mg/dL — ABNORMAL HIGH (ref 70–99)

## 2011-04-24 NOTE — Discharge Summary (Signed)
  Erica French, Erica French NO.:  [id number]  MEDICAL RECORD NO.:  [id number]  LOCATION:  3013                         FACILITY:  [facility]  PHYSICIAN:  Conley Canal, MD      DATE OF BIRTH:  1930-08-24  DATE OF ADMISSION:  04/19/2011 DATE OF DISCHARGE:                        DISCHARGE SUMMARY - REFERRING   ANTICIPATED DATE OF DISCHARGE:  April 23, 2011.  CONSULTING PHYSICIAN:  Dr. Barnett Abu, Neurosurgery.  DISCHARGE DIAGNOSES: 1. Acute pathologic compression fractures of L2 and L3, status post     balloon kyphoplasty of L2 and L3. 2. History of fall resulting in acute compression fractures of L2-L3. 3. History of cerebrovascular accident with left-sided hemiparesis,     now wheelchair bound. 4. Chronic obstructive pulmonary disease. 5. Status post cholecystectomy, status post partial thyroidectomy,     status post unilateral oophorectomy. 6. Depression. 7. Temporal arthritis, on prednisone. 8. Hypertension.DISCHARGE MEDICATIONS: 1. Flexeril 5 mg 3 times daily as needed. 2. Tylenol 650 mg every 6 hours as needed. 3. Tylenol Extra Strength 2 tablets at bedtime p.r.n. 4. Celexa 20 mg daily. 5. Florastor one capsule twice daily. 6. Loperamide daily as needed. 7. Magnesium oxide OTC one tablet daily. 8. Oxycodone 5 mg q.4 hourly as needed.  No new prescription is given. 9. Oyster Shell one tablet daily. 10.Plavix 75 mg daily. 11.KCl 10 mEq daily. 12.Prednisone 3 mg daily. 13.Senokot/Colace 8.6/50 mg at bedtime.  PROCEDURES PERFORMED: 1. Kyphoplasty of L2-L3 on August 6 by Dr. Danielle Dess. 2. CT lumbar spine on August 3 showed acute inferior endplate fracture     at L2 with retropulsion.  HOSPITAL COURSE:  Erica French is a very pleasant 77 year old female who came in from an assisted living facility after she fell about a week prior to the admission.  She was found to have acute vertebral compression fractures at L2 and L3 and was seen by Dr. Danielle Dess  who eventually performed L2 and L3 acrylic balloon kyphoplasty on August 6. Today, she reports that her pain is better, and she is otherwise hemodynamically stable.  Her labs include a white count of 6.5, hemoglobin 11.5, hematocrit 35.5, platelet count 237.  Electrolytes essentially normal. She will be discharged back to assisted-living facility today or tomorrow.  The patient should follow with Dr. Barnett Abu in 2 week's time.  The time spent for this discharge preparation is less than 30 minutes.     Conley Canal, MD     SR/MEDQ  D:  04/23/2011  T:  04/23/2011  Job:  [id number]  cc:   Erica French, M.D. Erica French, M.D. Erica French, M.D.  Electronically Signed by Conley Canal  on 04/24/2011 10:12:00 PM

## 2011-04-30 ENCOUNTER — Emergency Department (HOSPITAL_COMMUNITY): Payer: Medicare Other

## 2011-04-30 ENCOUNTER — Inpatient Hospital Stay (HOSPITAL_COMMUNITY)
Admission: EM | Admit: 2011-04-30 | Discharge: 2011-05-03 | DRG: 190 | Disposition: A | Discharge status: Skilled Nursing Facility | Payer: Medicare Other | Source: Ambulatory Visit | Attending: Internal Medicine | Admitting: Internal Medicine

## 2011-04-30 DIAGNOSIS — K5900 Constipation, unspecified: Secondary | ICD-10-CM | POA: Diagnosis not present

## 2011-04-30 DIAGNOSIS — Z66 Do not resuscitate: Secondary | ICD-10-CM | POA: Diagnosis present

## 2011-04-30 DIAGNOSIS — IMO0002 Reserved for concepts with insufficient information to code with codable children: Secondary | ICD-10-CM

## 2011-04-30 DIAGNOSIS — F329 Major depressive disorder, single episode, unspecified: Secondary | ICD-10-CM | POA: Diagnosis present

## 2011-04-30 DIAGNOSIS — I69959 Hemiplegia and hemiparesis following unspecified cerebrovascular disease affecting unspecified side: Secondary | ICD-10-CM

## 2011-04-30 DIAGNOSIS — J440 Chronic obstructive pulmonary disease with acute lower respiratory infection: Principal | ICD-10-CM | POA: Diagnosis present

## 2011-04-30 DIAGNOSIS — T380X5A Adverse effect of glucocorticoids and synthetic analogues, initial encounter: Secondary | ICD-10-CM | POA: Diagnosis present

## 2011-04-30 DIAGNOSIS — M316 Other giant cell arteritis: Secondary | ICD-10-CM | POA: Diagnosis present

## 2011-04-30 DIAGNOSIS — Z79899 Other long term (current) drug therapy: Secondary | ICD-10-CM

## 2011-04-30 DIAGNOSIS — D72829 Elevated white blood cell count, unspecified: Secondary | ICD-10-CM | POA: Diagnosis present

## 2011-04-30 DIAGNOSIS — J690 Pneumonitis due to inhalation of food and vomit: Secondary | ICD-10-CM | POA: Diagnosis present

## 2011-04-30 DIAGNOSIS — I10 Essential (primary) hypertension: Secondary | ICD-10-CM | POA: Diagnosis present

## 2011-04-30 DIAGNOSIS — T148XXA Other injury of unspecified body region, initial encounter: Secondary | ICD-10-CM

## 2011-04-30 DIAGNOSIS — Z87891 Personal history of nicotine dependence: Secondary | ICD-10-CM

## 2011-04-30 DIAGNOSIS — Z993 Dependence on wheelchair: Secondary | ICD-10-CM

## 2011-04-30 DIAGNOSIS — F411 Generalized anxiety disorder: Secondary | ICD-10-CM | POA: Diagnosis present

## 2011-04-30 DIAGNOSIS — F3289 Other specified depressive episodes: Secondary | ICD-10-CM | POA: Diagnosis present

## 2011-04-30 DIAGNOSIS — J209 Acute bronchitis, unspecified: Principal | ICD-10-CM | POA: Diagnosis present

## 2011-04-30 DIAGNOSIS — J9600 Acute respiratory failure, unspecified whether with hypoxia or hypercapnia: Secondary | ICD-10-CM | POA: Diagnosis present

## 2011-04-30 LAB — DIFFERENTIAL
Basophils Relative: 0 % (ref 0–1)
Lymphocytes Relative: 27 % (ref 12–46)
Lymphs Abs: 3.3 10*3/uL (ref 0.7–4.0)
Monocytes Absolute: 1.2 10*3/uL — ABNORMAL HIGH (ref 0.1–1.0)
Monocytes Relative: 10 % (ref 3–12)
Neutro Abs: 7.2 10*3/uL (ref 1.7–7.7)
Neutrophils Relative %: 60 % (ref 43–77)

## 2011-04-30 LAB — COMPREHENSIVE METABOLIC PANEL
ALT: 10 U/L (ref 0–35)
Alkaline Phosphatase: 105 U/L (ref 39–117)
CO2: 27 mEq/L (ref 19–32)
Calcium: 9.4 mg/dL (ref 8.4–10.5)
Chloride: 100 mEq/L (ref 96–112)
GFR calc Af Amer: >60 mL/min (ref >60)
GFR calc non Af Amer: >60 mL/min (ref >60)
Glucose, Bld: 196 mg/dL — ABNORMAL HIGH (ref 70–99)
Sodium: 138 mEq/L (ref 135–145)
Total Bilirubin: 0.3 mg/dL (ref 0.3–1.2)

## 2011-04-30 LAB — CBC
HCT: 36.9 % (ref 36.0–46.0)
Hemoglobin: 11.9 g/dL — ABNORMAL LOW (ref 12.0–15.0)
MCH: 30.3 pg (ref 26.0–34.0)
MCHC: 32.2 g/dL (ref 30.0–36.0)
MCV: 93.9 fL (ref 78.0–100.0)
RBC: 3.93 MIL/uL (ref 3.87–5.11)

## 2011-04-30 LAB — POCT I-STAT 3, ART BLOOD GAS (G3+)
Bicarbonate: 22.2 mEq/L (ref 20.0–24.0)
O2 Saturation: 94 %
Patient temperature: 98.6
TCO2: 23 mmol/L (ref 0–100)
pH, Arterial: 7.385 (ref 7.350–7.400)

## 2011-04-30 LAB — APTT: aPTT: 32 seconds (ref 24–37)

## 2011-05-01 ENCOUNTER — Inpatient Hospital Stay (HOSPITAL_COMMUNITY): Payer: Medicare Other

## 2011-05-01 LAB — BASIC METABOLIC PANEL
Calcium: 9.3 mg/dL (ref 8.4–10.5)
GFR calc Af Amer: >60 mL/min (ref >60)
GFR calc non Af Amer: >60 mL/min (ref >60)
Potassium: 4.8 mEq/L (ref 3.5–5.1)
Sodium: 137 mEq/L (ref 135–145)

## 2011-05-01 LAB — DIFFERENTIAL
Basophils Absolute: 0 10*3/uL (ref 0.0–0.1)
Basophils Relative: 0 % (ref 0–1)
Eosinophils Absolute: 0 10*3/uL (ref 0.0–0.7)
Eosinophils Relative: 0 % (ref 0–5)
Monocytes Absolute: 1.1 10*3/uL — ABNORMAL HIGH (ref 0.1–1.0)
Neutro Abs: 15.8 10*3/uL — ABNORMAL HIGH (ref 1.7–7.7)

## 2011-05-01 LAB — CBC
MCH: 30.3 pg (ref 26.0–34.0)
MCHC: 32.9 g/dL (ref 30.0–36.0)
Platelets: 265 10*3/uL (ref 150–400)
RDW: 13.1 % (ref 11.5–15.5)

## 2011-05-02 LAB — URINALYSIS, ROUTINE W REFLEX MICROSCOPIC
Glucose, UA: NEGATIVE mg/dL
Ketones, ur: NEGATIVE mg/dL
Nitrite: NEGATIVE
Protein, ur: NEGATIVE mg/dL
pH: 6 (ref 5.0–8.0)

## 2011-05-02 LAB — CBC
Platelets: 321 10*3/uL (ref 150–400)
RBC: 3.85 MIL/uL — ABNORMAL LOW (ref 3.87–5.11)
RDW: 13.1 % (ref 11.5–15.5)
WBC: 24.4 10*3/uL — ABNORMAL HIGH (ref 4.0–10.5)

## 2011-05-02 LAB — BASIC METABOLIC PANEL
CO2: 27 mEq/L (ref 19–32)
Calcium: 9.9 mg/dL (ref 8.4–10.5)
Chloride: 105 mEq/L (ref 96–112)
GFR calc Af Amer: >60 mL/min (ref >60)
Sodium: 140 mEq/L (ref 135–145)

## 2011-05-02 LAB — URINE MICROSCOPIC-ADD ON

## 2011-05-03 ENCOUNTER — Other Ambulatory Visit: Payer: Medicare Other

## 2011-05-03 LAB — CBC
HCT: 33.4 % — ABNORMAL LOW (ref 36.0–46.0)
MCH: 29.8 pg (ref 26.0–34.0)
MCHC: 32.3 g/dL (ref 30.0–36.0)
MCV: 92.3 fL (ref 78.0–100.0)
Platelets: 318 10*3/uL (ref 150–400)
RDW: 13.1 % (ref 11.5–15.5)
WBC: 17.8 10*3/uL — ABNORMAL HIGH (ref 4.0–10.5)

## 2011-05-03 LAB — URINE CULTURE: Colony Count: NO GROWTH

## 2011-05-03 NOTE — H&P (Signed)
NAMEVERMELL, MADRID NO.:  [id number]  MEDICAL RECORD NO.:  [id number]  LOCATION:  [facility]                         FACILITY:  [facility]  PHYSICIAN:  Jeoffrey Massed, MD    DATE OF BIRTH:  1930-02-14  DATE OF ADMISSION:  04/30/2011 DATE OF DISCHARGE:                             HISTORY & PHYSICAL   PRIMARY CARE PRACTITIONER:  Doran Durand, MD of [facility].  CHIEF COMPLAINT:  Shortness of breath that started this morning.  HISTORY OF PRESENT ILLNESS:  Ms. 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  Per the patient, she was in her usual state of health and she claims that after swallowing few pills earlier today, she started becoming short of breath.  She was evaluated by nurse practitioner in the skilled nursing facility and was thought to have possible COPD flare and sent to the ED for further evaluation.  In the ED initially, heart rate was 137 and she required around 5 L of oxygen via the nasal cannula to maintain her O2 saturations and ultimately required BiPAP.  She was also given steroids and nebulized bronchodilators by the ED staff.  She subsequently improved and the BiPAP was subsequently removed.  During my evaluation, the patient appeared very comfortable, awake and alert, not in acute distress.  She is now being admitted to the hospitalist service for further evaluation and treatment.  The patient denies any headache, fever, nausea, vomiting, diarrhea as well.  ALLERGIES:  THE PATIENT IS ALLERGIC TO PROZAC AND CODEINE.  PAST MEDICAL HISTORY: 1. Questionable history of  COPD. 2. A prior history of tobacco abuse. 3. Recent history of compression fracture status post kyphoplasty. 4. History of hypertension. 5. History of temporal arteritis on chronic p.o. prednisone. 6. Depression. 7. Hypertension. 8. As noted above CVA with left-sided hemiparesis now bed to     wheelchair bound and is nonambulatory for the past 2 years.  MEDICATIONS:  At the facility include the following. 1. Flexeril 5 mg p.o. 3 times a day p.r.n. 2. Tylenol 650 mg p.o. q.6 h p.r.n. 3. Celexa 20 mg p.o. daily. 4. Florastor 1 capsule p.o. twice daily. 5. Loperamide daily p.r.n. 6. Magnesium oxide 1 tablet p.o. daily. 7. Oxycodone 5 mg p.o. q.4 h p.r.n. 8. Plavix 75 mg p.o. daily. 9. KCl 10 mEq daily. 10.Prednisone 3 mg p.o. daily. 11.Senokot 1 tablet p.o. at bedtime.  FAMILY HISTORY:  Noncontributory due to current problem.  SOCIAL HISTORY:  Currently is a resident of a skilled nursing facility. She quit smoking around 4 years ago.  No other toxic habits.  REVIEW OF SYSTEMS:  A detailed review of 12 systems was done and these are negative except for ones noted in the HPI.  PHYSICAL EXAM:  General:  Lying in bed, does not appear to be in distress, able to lie  flat, is awake and alert.  She is not using any accessory muscles of respiration.  Easily speaking in full sentences. VITAL SIGNS:  Initial vital signs while presented in the ED showed a temperature of 98.3, heart rate of 137, blood pressure 150/112, respiration of 26, pulse ox of 90% on 5 L. HEENT:  Atraumatic, normocephalic.  Pupils equally reactive to light and accommodation. NECK:  Supple.  No JVD. CHEST:  Bilaterally clear to auscultation with the exception of very few scattered rhonchi and no rales were heard. CARDIOVASCULAR:  Heart sound is slightly tachycardic, but otherwise regular without any murmurs. ABDOMEN:  Soft, nontender, nondistended. EXTREMITIES:  Free of edema. SKIN:  Free of rash. NEUROLOGY:  The  patient is awake and alert.  Speech is clear.  She has 0/5 strength on the left upper extremity and around 4/5 strength on the left lower extremity.  Her right upper and lower extremities are 5/5.  LABORATORY DATA: 1. CBC shows a WBC of 12.1, hemoglobin of 11.9, and platelet count of     299.  Differential count shows neutrophil of 60%. 2. ABG done on unknown FiO2 shows a pH of 7.35, PCO2 of 37.2, PO2 of     71. 3. INR is 1.0. 4. Point-of-care cardiac enzymes shows a negative troponin. 5. ProBNP level is 393. 6. Chemistries on admission shows sodium of 138, potassium 3.3,     chloride of 100, bicarb of 27, glucose of 196, BUN of 16,     creatinine of 0.77, and a calcium of 9.4. 7. LFTs showed a total bilirubin of 0.3, alkaline phosphatase of 105,     AST of 22, ALT of 10, total protein of 7.7, and albumin of 3.6.  RADIOLOGICAL STUDIES:  A portable chest x-ray done on admission suggests hyperinflation.  Heart and mediastinum are within normal limits.  There is a questionable 5-mm nodular density in the left apex.  There is no focal air space disease.  EKG shows sinus tachycardia.  ASSESSMENT:  Shortness of breath, not entirely clear whether this is related to a questionable choking episode or actually a flare of her underlying chronic obstructive pulmonary disease.  PLAN: 1. The patient for now will be admitted overnight to [facility] for     further continued observation, actually, she has just been off her     BiPAP. 2. She will be placed on oxygen and given nebulized bronchodilators     along with steroids. 3. She does have leukocytosis, however, that is probably secondary to     chronic prednisone therapy.  She does not have any focal infection     that is very evident, has a toxic appearance.  So at this point, we     will hold off on starting antibiotics. 4. We will definitely get a formal swallowing study to be done by     speech therapist tomorrow morning. 5. In the  meantime, we will continue most of her oral medications,     which include Celexa, Plavix, and Protonix. 6. Further plan will depend as the patient's clinical course evolves. 7. The patient's code status has been confirmed with the patient and     the patient's daughter and she is a do not resuscitate. 8. DVT prophylaxis with Lovenox.  Total time spent for admission 45 minutes.     Jeoffrey Massed, MD     SG/MEDQ  D:  04/30/2011  T:  04/30/2011  Job:  [id number]  cc:   Theressa Millard,  M.D. Doran Durand, MD  Electronically Signed by Jeoffrey Massed  on 05/03/2011 02:33:21 PM

## 2011-05-07 ENCOUNTER — Emergency Department (HOSPITAL_COMMUNITY)
Admission: EM | Admit: 2011-05-07 | Discharge: 2011-05-07 | Disposition: A | Discharge status: Home / Self Care | Payer: Medicare Other | Attending: Emergency Medicine | Admitting: Emergency Medicine

## 2011-05-07 ENCOUNTER — Emergency Department (HOSPITAL_COMMUNITY): Payer: Medicare Other

## 2011-05-07 DIAGNOSIS — Z8673 Personal history of transient ischemic attack (TIA), and cerebral infarction without residual deficits: Secondary | ICD-10-CM | POA: Insufficient documentation

## 2011-05-07 DIAGNOSIS — I10 Essential (primary) hypertension: Secondary | ICD-10-CM | POA: Insufficient documentation

## 2011-05-07 DIAGNOSIS — Y9210 Unspecified residential institution as the place of occurrence of the external cause: Secondary | ICD-10-CM | POA: Insufficient documentation

## 2011-05-07 DIAGNOSIS — M810 Age-related osteoporosis without current pathological fracture: Secondary | ICD-10-CM | POA: Insufficient documentation

## 2011-05-07 DIAGNOSIS — M545 Low back pain, unspecified: Secondary | ICD-10-CM | POA: Insufficient documentation

## 2011-05-07 DIAGNOSIS — M25559 Pain in unspecified hip: Secondary | ICD-10-CM | POA: Insufficient documentation

## 2011-05-07 DIAGNOSIS — R296 Repeated falls: Secondary | ICD-10-CM | POA: Insufficient documentation

## 2011-05-07 DIAGNOSIS — J4489 Other specified chronic obstructive pulmonary disease: Secondary | ICD-10-CM | POA: Insufficient documentation

## 2011-05-07 DIAGNOSIS — J449 Chronic obstructive pulmonary disease, unspecified: Secondary | ICD-10-CM | POA: Insufficient documentation

## 2011-05-07 LAB — DIFFERENTIAL
Basophils Absolute: 0 10*3/uL (ref 0.0–0.1)
Eosinophils Absolute: 0.2 10*3/uL (ref 0.0–0.7)
Eosinophils Relative: 2 % (ref 0–5)
Monocytes Absolute: 1.2 10*3/uL — ABNORMAL HIGH (ref 0.1–1.0)

## 2011-05-07 LAB — BASIC METABOLIC PANEL
BUN: 21 mg/dL (ref 6–23)
Chloride: 102 mEq/L (ref 96–112)
Creatinine, Ser: 0.82 mg/dL (ref 0.50–1.10)
GFR calc Af Amer: >60 mL/min (ref >60)
GFR calc non Af Amer: >60 mL/min (ref >60)
Potassium: 4.2 mEq/L (ref 3.5–5.1)

## 2011-05-07 LAB — CBC
MCHC: 32.6 g/dL (ref 30.0–36.0)
Platelets: 300 10*3/uL (ref 150–400)
RDW: 12.8 % (ref 11.5–15.5)
WBC: 9.1 10*3/uL (ref 4.0–10.5)

## 2011-05-08 LAB — CULTURE, BLOOD (ROUTINE X 2)
Culture  Setup Time: 201208161832
Culture  Setup Time: 201208161832
Culture: NO GROWTH

## 2011-05-23 NOTE — Consult Note (Signed)
  [name], [name] NO.:  [id number]  MEDICAL RECORD NO.:  [id number]  LOCATION:  3013                         FACILITY:  [facility]  PHYSICIAN:  Stefani Dama, M.D.  DATE OF BIRTH:  11-21-1929  DATE OF CONSULTATION:  04/20/2011 DATE OF DISCHARGE:                                CONSULTATION   REQUESTOR:  [facility], Dr. Lenn Sink.  The patient is a 77 year old woman who was admitted from an assisted living facility following a fall 2 weeks ago, which led to compression fractures of her lumbar spine.  These occurred at L2 and L3.  She is hemiplegic secondary to an old right-sided brain infarct.  She has left facial weakness, left hemisensory deficit.  She was being helped to the bathroom apparently when she had a fall against the side of the wall, complained of back pain, but has gotten progressively worse and she is admitted to the hospital on the 75rd and CT scans revealed the presence of compression fractures of the inferior endplate of L2 and superior endplate of L3.  This is at the apex of a kyphoscoliosis, which puts most of the weightbearing surface off to the left side.  The patient is admitted for pain control and I have asked to evaluate her in regards to any treatment for specific to the fractures.  PAST MEDICAL HISTORY:  Notable that she has had ill health and she has been living in an assisted living facility.  She has had problems with longstanding complications from the stroke, chronic obstructive pulmonary disease.  She had a cholecystectomy and a partial thyroidectomy in the past.  She suffers of chronic depressions.  CURRENT MEDICATIONS:  Plavix, citalopram, acetaminophen, loperamide, oxycodone, Florastor, acetaminophen, senna, potassium, magnesium oxide. She also uses prednisone.  There is no history of diabetes, hypertension, rheumatic fever or MI.  PHYSICAL EXAMINATION:  She is a frail, ill-appearing lady with a  left hemiplegia.  She has good motor function on the right side.  She is acute tenderness to palpation and percussion in the mid lumbar spine.  IMPRESSION:  The patient has evidence of compression fractures of L2 and L3.  PLAN:  To take the patient to the operating room to undergo surgical balloon kyphoplasty.  I discussed this with the patient and she is in agreement.  We will plan on scheduling this for the beginning of the coming weeks on the 6th.     Stefani Dama, M.D.     Merla Riches  D:  04/22/2011  T:  04/22/2011  Job:  [id number]  Electronically Signed by Barnett Abu M.D. on 05/23/2011 03:14:16 PM

## 2011-05-23 NOTE — Op Note (Signed)
NAMEJANELE, French NO.:  [id number]  MEDICAL RECORD NO.:  [id number]  LOCATION:  3013                         FACILITY:  [facility]  PHYSICIAN:  Stefani Dama, M.D.  DATE OF BIRTH:  [birth year]  DATE OF PROCEDURE:  04/22/2011 DATE OF DISCHARGE:                              OPERATIVE REPORT   PREOPERATIVE DIAGNOSES: 1. Pathologic compression fractures of L2. 2. Pathologic compression fractures of L3.  POSTOPERATIVE DIAGNOSES: 1. Pathologic compression fractures of L2. 2. Pathologic compression fractures of L3.  OPERATION: 1. L2 acrylic balloon kyphoplasty. 2. L3 acrylic balloon kyphoplasty.  SURGEON:  Stefani Dama, MD  ANESTHESIA:  General endotracheal.  INDICATIONS:  Erica French is a 77 year old individual who has had significant problems with back pain for the last 2 weeks' time. Apparently, she sustained a fairly modest fall at assisted living facility, but has had increasing amounts of pain.  She has a kyphoscoliosis with the apex at L2-L3 and CT demonstrates she has an inferior endplate fracture of L2 and superior endplate fracture of L3. She is now taken to the operating room to undergo acrylic balloon kyphoplasty at each of these levels.  PROCEDURE IN DETAIL:  The patient was brought to the operating room. After the smooth induction of general endotracheal anesthesia, she was turned prone onto vertical rolls.  Her left arm was secured in a more neutral position along her trunk.  Back was prepped with alcohol and DuraPrep draped in sterile fashion.  Fluoroscopic guidance was used to localize the lateral pedicular zone at L2-L3.  This was done on a biplane arrangement and skin infiltration was performed and area chosen for the entry site in roughly the 10 o'clock position relative to the pedicle.  #11 blade was used to stab the skin and a Jamshidi needle was placed into the vertebral body at L2, lateral aspect of the pedicle and in the  posterior wall of the vertebral body was pierced, the inner cannula was removed.  A drill was inserted to drill through the vertebral body.  Then, a balloon was inserted and inflation began.  This was first done at L3 and then done at L2.  The balloon was noted to inflate easily rapidly to approximately 70-80 mmHg pressure but never exceeded 150 mmHg pressure.  Filling of the vertebral body and the endplates was noted.  Then cement was mixed and allowed to harden to the appropriate consistency and L3 and L2 each 4.5 mL of cement was injected totally and this allowed for good filling of vertebral bodies, particularly around the endplates.  With this endpoint being reached, the inner cannulas were removed, the outer cannulas were withdrawn slowly on the fluoroscopic guidance to make sure there was no evidence of a kyphoplasty tail and when this was verified, the procedure was completed with 3-0 Vicryl sutures in the subcuticular skin at each insertion site.  The patient tolerated the procedure well.  Final radiographs were obtained for confirmation in the patient's chart and she was returned to recovery room in stable condition.     Stefani Dama, M.D.     Erica French  D:  04/22/2011  T:  04/22/2011  Job:  [id number]  Electronically  Signed by Barnett Abu M.D. on 05/23/2011 03:14:32 PM

## 2011-06-03 NOTE — Discharge Summary (Signed)
[name], [name]:  [id number]  MEDICAL RECORD NO.:  [id number]  LOCATION:  3733                         FACILITY:  [facility]  PHYSICIAN:  Thad Ranger, MD       DATE OF BIRTH:  1930-02-15  DATE OF ADMISSION:  04/30/2011 DATE OF DISCHARGE:                              DISCHARGE SUMMARY   PRIMARY CARE PHYSICIAN:  Doran Durand, MD of [facility].  DISCHARGE DIAGNOSES: 1. Acute hypoxic respiratory failure secondary to acute bronchitis and     aspiration pneumonitis, resolved. 2. Underlying undiagnosed chronic obstructive pulmonary disease     exacerbation, improved. 3. Leukocytosis likely secondary to prednisone use. 4. Acute anxiety, likely secondary to IV Solu-Medrol and     hospitalization, improved. 5. Hypertension. 6. Depression. 7. History of temporal arteritis, on chronic p.o. prednisone. 8. Recent history of compression fracture status post kyphoplasty. 9. Remote nicotine abuse. 10.History of cerebrovascular accident, now bed to wheelchair bound.  DISCHARGE MEDICATIONS: 1. Alprazolam 0.25 mg p.o. b.i.d. as needed for anxiety. 2. Doxycycline 100 mg p.o. b.i.d. for another 5 days. 3. Advair Diskus one puff inhaled twice daily 250/50. 4. Lopressor 12.5 mg p.o. b.i.d. 5. Protonix 40 mg p.o. daily. 6. Xopenex nebs 0.63 mg one neb inhaled twice daily and q.4 h p.r.n.     for shortness of breath/wheezing. 7. Oxycodone 5 mg p.o. every 4 hours as needed for pain, dose     decreased. 8. Acetaminophen 325 mg two tablets p.o. every 6 hours as needed for     pain. 9. Citalopram 20 mg p.o. daily. 10.Flexeril 5 mg p.o. daily as needed for muscle spasm. 11.Florastor one capsule p.o. b.i.d. 12.Loperamide 2 mg two tablets p.o. daily as needed. 13.Magnesium oxide OTC one tablet p.o. daily. 14.Oyster Shell Calcium 500 mg p.o. daily. 15.Plavix 75 mg daily. 16.Prednisone 3 mg p.o. daily. 17.Senna/docusate 8.6/50 one tablet p.o. daily at bedtime as  needed     for constipation.  Following medication is stopped; 1. Potassium chloride.  The patient should only get potassium Elixir     if she needs for hypokalemia.  Please do not administer her     potassium chloride tablets.  HISTORY OF PRESENT ILLNESS:  At the time of admission, Erica French is a 77 year old female, resident of skilled nursing facility with a history of CVA, COPD, history of smoking, who quit around 4 years ago, came in with acute shortness of breath.  Per the patient, she was in her usual state of health and claimed that after swallowing her pills, she became extremely short of breath.  In the emergency room, the patient's heart rate was noticed to be 137 and requiring 5 liters of oxygen to maintain her oxygen saturation.  Ultimately, she required BiPAP.  She was also given steroids and nebulized bronchodilators by the ED staff.  She subsequently improved and BiPAP was removed.  The patient was admitted by the Hospitalist Service.  RADIOLOGICAL DATA:  Chest x-ray portable, August 14, hyperinflation without focal airspace disease possible 5 mm nodule at the left lung apex.  The patient may benefit from a chest CT to evaluate this nausea and ST-T to hyperinflation and probable emphysematous changes.  Repeat chest x-ray was obtained on August 15 showed COPD without acute disease.  BRIEF HOSPITALIZATION COURSE:  Erica French is a 77 year old female who was admitted with acute hypoxic respiratory failure secondary to aspiration pneumonitis after choking on the pills at the skilled nursing facility. 1. Acute respiratory failure with aspiration pneumonitis and acute     bronchitis, resolved.  The patient was admitted overnight to step-     down unit.  The patient improved significantly with the     bronchodilator treatments and IV Solu-Medrol.  BiPAP was removed.     Currently, the patient has been satting well on 1-2 liters.  Her     sats has been 99%.  She does  have an underlying COPD, which the     patient states she did not know of.  She has a history of tobacco     abuse in the past.  The patient was started on albuterol nebs,     however, due to tachycardia.  This has been changed to Xopenex at     the time of discharge.  She was also placed on Advair Diskus, which     she should continue.  The patient was transitioned to p.o.     prednisone.  However, due to IV Solu-Medrol, she developed     significant anxiety, after the prednisone was tapered her anxiety     improved.  The patient will continue prednisone taper. 2. Hypertension.  Blood pressure was somewhat elevated due to     tachycardia, hence, the patient was started on low-dose Lopressor. 3. Aspiration pneumonitis.  This has resolved.  The patient underwent     formal swallow study during hospitalization and she does not have     any dysphagia or aspiration.  It is highly recommended to the     skilled nursing facility not to give her potassium tablets.  If she     needs any potassium replacement, she should get potassium Elixir or     in a liquid form.  The patient will be discharged to skilled     nursing facility today.  PHYSICAL EXAMINATION:  GENERAL:  The patient is alert, awake, and oriented x3, not in acute distress. HEENT:  Anicteric sclerae, pink conjunctiva.  Pupils are equal, reactive to light, and accommodation.  EOMI. NECK:  Supple.  No lymphopathy. CVS:  S1-S2 clear. CHEST:  Clear to auscultation bilaterally.  No wheezing, rales, or rhonchi. ABDOMEN:  Soft, nontender, nondistended.  Normal bowel sounds. EXTREMITIES:  No cyanosis, clubbing, or edema noted on upper or lower extremities bilaterally.  DISCHARGE FOLLOW-UP:  With Dr. Doran Durand within 7 days for hospital followup.  DISCHARGE TIME:  35 minutes.     Thad Ranger, MD     RR/MEDQ  D:  05/03/2011  T:  05/03/2011  Job:  [id number]  cc:   Doran Durand, MD  Electronically Signed by Andres Labrum RAI   on 06/03/2011 03:40:01 PM

## 2011-06-21 LAB — VITAMIN B12: Vitamin B-12: 803 pg/mL (ref 211–911)

## 2011-06-21 LAB — URINALYSIS, ROUTINE W REFLEX MICROSCOPIC
Bilirubin Urine: NEGATIVE
Glucose, UA: NEGATIVE mg/dL
Hgb urine dipstick: NEGATIVE
Hgb urine dipstick: NEGATIVE
Ketones, ur: 15 mg/dL — AB
Nitrite: NEGATIVE
Nitrite: NEGATIVE
Protein, ur: NEGATIVE mg/dL
Protein, ur: NEGATIVE mg/dL
Specific Gravity, Urine: 1.007 (ref 1.005–1.030)
Specific Gravity, Urine: 1.013 (ref 1.005–1.030)
Urobilinogen, UA: 0.2 mg/dL (ref 0.0–1.0)
Urobilinogen, UA: 1 mg/dL (ref 0.0–1.0)
pH: 7.5 (ref 5.0–8.0)

## 2011-06-21 LAB — CBC
HCT: 28.1 % — ABNORMAL LOW (ref 36.0–46.0)
HCT: 30.5 % — ABNORMAL LOW (ref 36.0–46.0)
HCT: 32.4 % — ABNORMAL LOW (ref 36.0–46.0)
Hemoglobin: 9.2 g/dL — ABNORMAL LOW (ref 12.0–15.0)
MCHC: 32.9 g/dL (ref 30.0–36.0)
MCHC: 33.5 g/dL (ref 30.0–36.0)
MCV: 92.5 fL (ref 78.0–100.0)
MCV: 93.2 fL (ref 78.0–100.0)
MCV: 93.3 fL (ref 78.0–100.0)
MCV: 93.5 fL (ref 78.0–100.0)
MCV: 93.9 fL (ref 78.0–100.0)
Platelets: 140 10*3/uL — ABNORMAL LOW (ref 150–400)
Platelets: 161 10*3/uL (ref 150–400)
Platelets: 189 10*3/uL (ref 150–400)
Platelets: 212 10*3/uL (ref 150–400)
Platelets: 259 10*3/uL (ref 150–400)
RBC: 2.99 MIL/uL — ABNORMAL LOW (ref 3.87–5.11)
RBC: 3.28 MIL/uL — ABNORMAL LOW (ref 3.87–5.11)
RBC: 3.5 MIL/uL — ABNORMAL LOW (ref 3.87–5.11)
RBC: 3.54 MIL/uL — ABNORMAL LOW (ref 3.87–5.11)
RBC: 3.83 MIL/uL — ABNORMAL LOW (ref 3.87–5.11)
RDW: 13.2 % (ref 11.5–15.5)
WBC: 4.3 10*3/uL (ref 4.0–10.5)
WBC: 4.4 10*3/uL (ref 4.0–10.5)
WBC: 5.3 10*3/uL (ref 4.0–10.5)
WBC: 6.7 10*3/uL (ref 4.0–10.5)
WBC: 7.6 10*3/uL (ref 4.0–10.5)

## 2011-06-21 LAB — DIFFERENTIAL
Eosinophils Absolute: 0 10*3/uL (ref 0.0–0.7)
Eosinophils Absolute: 0.2 10*3/uL (ref 0.0–0.7)
Eosinophils Relative: 5 % (ref 0–5)
Lymphocytes Relative: 20 % (ref 12–46)
Lymphocytes Relative: 34 % (ref 12–46)
Lymphs Abs: 1.5 10*3/uL (ref 0.7–4.0)
Lymphs Abs: 1.5 10*3/uL (ref 0.7–4.0)
Monocytes Absolute: 0.5 10*3/uL (ref 0.1–1.0)
Monocytes Relative: 13 % — ABNORMAL HIGH (ref 3–12)
Monocytes Relative: 9 % (ref 3–12)
Neutrophils Relative %: 70 % (ref 43–77)

## 2011-06-21 LAB — BASIC METABOLIC PANEL
BUN: 30 mg/dL — ABNORMAL HIGH (ref 6–23)
BUN: 5 mg/dL — ABNORMAL LOW (ref 6–23)
BUN: 7 mg/dL (ref 6–23)
BUN: 8 mg/dL (ref 6–23)
CO2: 23 mEq/L (ref 19–32)
CO2: 24 mEq/L (ref 19–32)
Calcium: 7.7 mg/dL — ABNORMAL LOW (ref 8.4–10.5)
Calcium: 8.2 mg/dL — ABNORMAL LOW (ref 8.4–10.5)
Chloride: 102 mEq/L (ref 96–112)
Chloride: 103 mEq/L (ref 96–112)
Chloride: 106 mEq/L (ref 96–112)
Chloride: 107 mEq/L (ref 96–112)
Creatinine, Ser: 0.57 mg/dL (ref 0.4–1.2)
Creatinine, Ser: 0.6 mg/dL (ref 0.4–1.2)
Creatinine, Ser: 1.64 mg/dL — ABNORMAL HIGH (ref 0.4–1.2)
GFR calc Af Amer: 37 mL/min — ABNORMAL LOW (ref >60)
GFR calc Af Amer: >60 mL/min (ref >60)
GFR calc Af Amer: >60 mL/min (ref >60)
GFR calc Af Amer: >60 mL/min (ref >60)
GFR calc non Af Amer: 30 mL/min — ABNORMAL LOW (ref >60)
GFR calc non Af Amer: >60 mL/min (ref >60)
GFR calc non Af Amer: >60 mL/min (ref >60)
GFR calc non Af Amer: >60 mL/min (ref >60)
Glucose, Bld: 74 mg/dL (ref 70–99)
Glucose, Bld: 90 mg/dL (ref 70–99)
Potassium: 2.9 mEq/L — ABNORMAL LOW (ref 3.5–5.1)
Potassium: 3.7 mEq/L (ref 3.5–5.1)
Potassium: 3.9 mEq/L (ref 3.5–5.1)
Potassium: 4 mEq/L (ref 3.5–5.1)
Sodium: 136 mEq/L (ref 135–145)
Sodium: 137 mEq/L (ref 135–145)
Sodium: 137 mEq/L (ref 135–145)

## 2011-06-21 LAB — FOLATE RBC: RBC Folate: 1074 ng/mL — ABNORMAL HIGH (ref 180–600)

## 2011-06-21 LAB — URINE CULTURE
Colony Count: NO GROWTH
Culture: NO GROWTH
Special Requests: NEGATIVE

## 2011-06-21 LAB — SEDIMENTATION RATE: Sed Rate: 22 mm/hr (ref 0–22)

## 2011-06-21 LAB — COMPREHENSIVE METABOLIC PANEL
ALT: 40 U/L — ABNORMAL HIGH (ref 0–35)
AST: 28 U/L (ref 0–37)
AST: 37 U/L (ref 0–37)
Albumin: 3 g/dL — ABNORMAL LOW (ref 3.5–5.2)
BUN: 16 mg/dL (ref 6–23)
CO2: 29 mEq/L (ref 19–32)
CO2: 29 mEq/L (ref 19–32)
Calcium: 9.7 mg/dL (ref 8.4–10.5)
Calcium: 9.8 mg/dL (ref 8.4–10.5)
Chloride: 106 mEq/L (ref 96–112)
Creatinine, Ser: 0.86 mg/dL (ref 0.4–1.2)
Creatinine, Ser: 0.94 mg/dL (ref 0.4–1.2)
GFR calc Af Amer: >60 mL/min (ref >60)
GFR calc non Af Amer: 58 mL/min — ABNORMAL LOW (ref >60)
GFR calc non Af Amer: >60 mL/min (ref >60)
Glucose, Bld: 86 mg/dL (ref 70–99)
Sodium: 138 mEq/L (ref 135–145)
Total Bilirubin: 0.6 mg/dL (ref 0.3–1.2)

## 2011-06-21 LAB — LIPID PANEL
HDL: 77 mg/dL (ref >39)
Triglycerides: 31 mg/dL (ref <150)
VLDL: 6 mg/dL (ref 0–40)

## 2011-06-21 LAB — PROTIME-INR
INR: 1 (ref 0.00–1.49)
INR: 1 (ref 0.00–1.49)
Prothrombin Time: 12.8 seconds (ref 11.6–15.2)
Prothrombin Time: 13.5 seconds (ref 11.6–15.2)

## 2011-06-21 LAB — URINE MICROSCOPIC-ADD ON

## 2011-06-21 LAB — APTT: aPTT: 30 seconds (ref 24–37)

## 2011-06-21 LAB — CARDIAC PANEL(CRET KIN+CKTOT+MB+TROPI)
CK, MB: 1.5 ng/mL (ref 0.3–4.0)
Relative Index: INVALID (ref 0.0–2.5)
Troponin I: 0.02 ng/mL (ref 0.00–0.06)

## 2011-06-21 LAB — HOMOCYSTEINE: Homocysteine: 10.1 umol/L (ref 4.0–15.4)

## 2011-06-21 LAB — CK TOTAL AND CKMB (NOT AT ARMC): Total CK: 42 U/L (ref 7–177)

## 2011-08-28 ENCOUNTER — Institutional Professional Consult (permissible substitution): Payer: PRIVATE HEALTH INSURANCE | Admitting: Pulmonary Disease

## 2011-09-25 ENCOUNTER — Institutional Professional Consult (permissible substitution): Payer: PRIVATE HEALTH INSURANCE | Admitting: Pulmonary Disease

## 2012-05-17 DIAGNOSIS — J189 Pneumonia, unspecified organism: Secondary | ICD-10-CM

## 2012-05-17 HISTORY — DX: Pneumonia, unspecified organism: J18.9

## 2012-06-05 ENCOUNTER — Inpatient Hospital Stay (HOSPITAL_COMMUNITY)
Admission: EM | Admit: 2012-06-05 | Discharge: 2012-06-11 | DRG: 392 | Disposition: A | Discharge status: Skilled Nursing Facility | Payer: Medicare Other | Attending: Internal Medicine | Admitting: Internal Medicine

## 2012-06-05 ENCOUNTER — Encounter (HOSPITAL_COMMUNITY): Payer: Self-pay | Admitting: Emergency Medicine

## 2012-06-05 ENCOUNTER — Emergency Department (HOSPITAL_COMMUNITY): Payer: Medicare Other

## 2012-06-05 DIAGNOSIS — R509 Fever, unspecified: Secondary | ICD-10-CM

## 2012-06-05 DIAGNOSIS — R1115 Cyclical vomiting syndrome unrelated to migraine: Secondary | ICD-10-CM

## 2012-06-05 DIAGNOSIS — I10 Essential (primary) hypertension: Secondary | ICD-10-CM | POA: Diagnosis present

## 2012-06-05 DIAGNOSIS — R1310 Dysphagia, unspecified: Secondary | ICD-10-CM | POA: Diagnosis present

## 2012-06-05 DIAGNOSIS — K228 Other specified diseases of esophagus: Secondary | ICD-10-CM | POA: Diagnosis present

## 2012-06-05 DIAGNOSIS — K209 Esophagitis, unspecified without bleeding: Secondary | ICD-10-CM

## 2012-06-05 DIAGNOSIS — J449 Chronic obstructive pulmonary disease, unspecified: Secondary | ICD-10-CM

## 2012-06-05 DIAGNOSIS — Z8673 Personal history of transient ischemic attack (TIA), and cerebral infarction without residual deficits: Secondary | ICD-10-CM

## 2012-06-05 DIAGNOSIS — Z7902 Long term (current) use of antithrombotics/antiplatelets: Secondary | ICD-10-CM

## 2012-06-05 DIAGNOSIS — A088 Other specified intestinal infections: Principal | ICD-10-CM | POA: Diagnosis present

## 2012-06-05 DIAGNOSIS — K2289 Other specified disease of esophagus: Secondary | ICD-10-CM | POA: Diagnosis present

## 2012-06-05 DIAGNOSIS — M316 Other giant cell arteritis: Secondary | ICD-10-CM | POA: Diagnosis present

## 2012-06-05 DIAGNOSIS — J4489 Other specified chronic obstructive pulmonary disease: Secondary | ICD-10-CM | POA: Diagnosis present

## 2012-06-05 DIAGNOSIS — R109 Unspecified abdominal pain: Secondary | ICD-10-CM

## 2012-06-05 DIAGNOSIS — E8809 Other disorders of plasma-protein metabolism, not elsewhere classified: Secondary | ICD-10-CM | POA: Diagnosis present

## 2012-06-05 HISTORY — DX: Depression, unspecified: F32.A

## 2012-06-05 HISTORY — DX: Headache: R51

## 2012-06-05 HISTORY — DX: Pneumonia, unspecified organism: J18.9

## 2012-06-05 HISTORY — DX: Nontoxic single thyroid nodule: E04.1

## 2012-06-05 HISTORY — DX: Essential (primary) hypertension: I10

## 2012-06-05 HISTORY — DX: Chronic obstructive pulmonary disease, unspecified: J44.9

## 2012-06-05 HISTORY — DX: Major depressive disorder, single episode, unspecified: F32.9

## 2012-06-05 HISTORY — DX: Cerebral infarction, unspecified: I63.9

## 2012-06-05 HISTORY — DX: Anxiety disorder, unspecified: F41.9

## 2012-06-05 LAB — CBC WITH DIFFERENTIAL/PLATELET
Eosinophils Relative: 0 % (ref 0–5)
HCT: 36.5 % (ref 36.0–46.0)
Hemoglobin: 11.8 g/dL — ABNORMAL LOW (ref 12.0–15.0)
Lymphocytes Relative: 5 % — ABNORMAL LOW (ref 12–46)
MCHC: 32.3 g/dL (ref 30.0–36.0)
MCV: 90.3 fL (ref 78.0–100.0)
Monocytes Absolute: 1.2 10*3/uL — ABNORMAL HIGH (ref 0.1–1.0)
Monocytes Relative: 12 % (ref 3–12)
Neutro Abs: 8.3 10*3/uL — ABNORMAL HIGH (ref 1.7–7.7)

## 2012-06-05 LAB — POCT I-STAT TROPONIN I: Troponin i, poc: 0.02 ng/mL (ref 0.00–0.08)

## 2012-06-05 LAB — URINALYSIS, ROUTINE W REFLEX MICROSCOPIC
Ketones, ur: 15 mg/dL — AB
Leukocytes, UA: NEGATIVE
Nitrite: NEGATIVE
Protein, ur: NEGATIVE mg/dL
Urobilinogen, UA: 0.2 mg/dL (ref 0.0–1.0)

## 2012-06-05 LAB — COMPREHENSIVE METABOLIC PANEL
BUN: 16 mg/dL (ref 6–23)
CO2: 26 mEq/L (ref 19–32)
Calcium: 8.8 mg/dL (ref 8.4–10.5)
Chloride: 107 mEq/L (ref 96–112)
Creatinine, Ser: 0.74 mg/dL (ref 0.50–1.10)
GFR calc Af Amer: >90 mL/min (ref >90)
GFR calc non Af Amer: 78 mL/min — ABNORMAL LOW (ref >90)
Total Bilirubin: 0.5 mg/dL (ref 0.3–1.2)

## 2012-06-05 LAB — LIPASE, BLOOD: Lipase: 15 U/L (ref 11–59)

## 2012-06-05 MED ORDER — ACETAMINOPHEN 650 MG RE SUPP
650.0000 mg | Freq: Once | RECTAL | Status: AC
  Administered 2012-06-05: 650 mg via RECTAL
  Filled 2012-06-05: qty 1

## 2012-06-05 MED ORDER — DIPHENHYDRAMINE HCL 50 MG/ML IJ SOLN
25.0000 mg | Freq: Once | INTRAMUSCULAR | Status: AC
  Administered 2012-06-05: 25 mg via INTRAVENOUS
  Filled 2012-06-05: qty 1

## 2012-06-05 MED ORDER — ONDANSETRON HCL 4 MG/2ML IJ SOLN
4.0000 mg | INTRAMUSCULAR | Status: AC
  Administered 2012-06-05: 4 mg via INTRAVENOUS
  Filled 2012-06-05: qty 2

## 2012-06-05 MED ORDER — POTASSIUM CHLORIDE IN NACL 20-0.9 MEQ/L-% IV SOLN
INTRAVENOUS | Status: DC
  Administered 2012-06-05: via INTRAVENOUS
  Filled 2012-06-05 (×2): qty 1000

## 2012-06-05 MED ORDER — SODIUM CHLORIDE 0.9 % IV BOLUS (SEPSIS)
1000.0000 mL | Freq: Once | INTRAVENOUS | Status: AC
  Administered 2012-06-05: 1000 mL via INTRAVENOUS

## 2012-06-05 MED ORDER — PIPERACILLIN-TAZOBACTAM 3.375 G IVPB
3.3750 g | Freq: Three times a day (TID) | INTRAVENOUS | Status: DC
  Administered 2012-06-05 – 2012-06-07 (×5): 3.375 g via INTRAVENOUS
  Filled 2012-06-05 (×8): qty 50

## 2012-06-05 MED ORDER — ONDANSETRON HCL 4 MG/2ML IJ SOLN
INTRAMUSCULAR | Status: AC
  Administered 2012-06-05: 4 mg
  Filled 2012-06-05: qty 2

## 2012-06-05 MED ORDER — CLOPIDOGREL BISULFATE 75 MG PO TABS
75.0000 mg | ORAL_TABLET | Freq: Every day | ORAL | Status: DC
  Administered 2012-06-07 – 2012-06-11 (×5): 75 mg via ORAL
  Filled 2012-06-05 (×7): qty 1

## 2012-06-05 MED ORDER — ONDANSETRON HCL 4 MG/2ML IJ SOLN
4.0000 mg | Freq: Four times a day (QID) | INTRAMUSCULAR | Status: DC | PRN
  Administered 2012-06-07: 4 mg via INTRAVENOUS
  Filled 2012-06-05: qty 2

## 2012-06-05 MED ORDER — PIPERACILLIN-TAZOBACTAM 3.375 G IVPB 30 MIN
3.3750 g | Freq: Three times a day (TID) | INTRAVENOUS | Status: DC
  Filled 2012-06-05 (×2): qty 50

## 2012-06-05 MED ORDER — ACETAMINOPHEN 650 MG RE SUPP: 650.0000 mg | Freq: Once | RECTAL | Status: DC

## 2012-06-05 MED ORDER — ONDANSETRON HCL 4 MG/2ML IJ SOLN: 8.0000 mg | Freq: Four times a day (QID) | INTRAMUSCULAR | Status: DC

## 2012-06-05 MED ORDER — MAGNESIUM OXIDE 400 MG PO TABS
400.0000 mg | ORAL_TABLET | Freq: Every day | ORAL | Status: DC
  Administered 2012-06-07 – 2012-06-11 (×5): 400 mg via ORAL
  Filled 2012-06-05 (×6): qty 1

## 2012-06-05 MED ORDER — ONDANSETRON 8 MG/NS 50 ML IVPB
8.0000 mg | Freq: Four times a day (QID) | INTRAVENOUS | Status: DC
  Administered 2012-06-06 – 2012-06-08 (×10): 8 mg via INTRAVENOUS
  Filled 2012-06-05 (×12): qty 8

## 2012-06-05 MED ORDER — FLUTICASONE-SALMETEROL 250-50 MCG/DOSE IN AEPB
1.0000 | INHALATION_SPRAY | Freq: Two times a day (BID) | RESPIRATORY_TRACT | Status: DC
  Administered 2012-06-06 – 2012-06-10 (×9): 1 via RESPIRATORY_TRACT
  Filled 2012-06-05 (×3): qty 14

## 2012-06-05 MED ORDER — METOCLOPRAMIDE HCL 5 MG/ML IJ SOLN
10.0000 mg | INTRAMUSCULAR | Status: AC
  Administered 2012-06-05: 10 mg via INTRAVENOUS
  Filled 2012-06-05: qty 2

## 2012-06-05 MED ORDER — SENNA-DOCUSATE SODIUM 8.6-50 MG PO TABS
1.0000 | ORAL_TABLET | Freq: Every day | ORAL | Status: DC
  Administered 2012-06-06 – 2012-06-11 (×3): 1 via ORAL
  Filled 2012-06-05 (×6): qty 1

## 2012-06-05 MED ORDER — CARVEDILOL 3.125 MG PO TABS
3.1250 mg | ORAL_TABLET | Freq: Two times a day (BID) | ORAL | Status: DC
  Administered 2012-06-06 – 2012-06-11 (×11): 3.125 mg via ORAL
  Filled 2012-06-05 (×13): qty 1

## 2012-06-05 MED ORDER — PREDNISONE 1 MG PO TABS
3.0000 mg | ORAL_TABLET | Freq: Every day | ORAL | Status: DC
  Filled 2012-06-05: qty 3

## 2012-06-05 MED ORDER — CITALOPRAM HYDROBROMIDE 20 MG PO TABS
20.0000 mg | ORAL_TABLET | Freq: Every day | ORAL | Status: DC
  Administered 2012-06-06 – 2012-06-11 (×6): 20 mg via ORAL
  Filled 2012-06-05 (×7): qty 1

## 2012-06-05 MED ORDER — ACETAMINOPHEN 325 MG PO TABS
650.0000 mg | ORAL_TABLET | Freq: Four times a day (QID) | ORAL | Status: DC | PRN
Start: 2012-06-05 — End: 2012-06-11
  Administered 2012-06-06: 650 mg via ORAL
  Filled 2012-06-05: qty 2

## 2012-06-05 MED ORDER — ENOXAPARIN SODIUM 40 MG/0.4ML ~~LOC~~ SOLN
40.0000 mg | SUBCUTANEOUS | Status: DC
  Administered 2012-06-06 – 2012-06-10 (×6): 40 mg via SUBCUTANEOUS
  Filled 2012-06-05 (×8): qty 0.4

## 2012-06-05 MED ORDER — ACETAMINOPHEN 650 MG RE SUPP
650.0000 mg | Freq: Four times a day (QID) | RECTAL | Status: DC | PRN
  Administered 2012-06-06: 650 mg via RECTAL
  Filled 2012-06-05: qty 1

## 2012-06-05 MED ORDER — IPRATROPIUM BROMIDE 0.02 % IN SOLN
500.0000 ug | Freq: Four times a day (QID) | RESPIRATORY_TRACT | Status: DC
  Administered 2012-06-06 – 2012-06-07 (×6): 500 ug via RESPIRATORY_TRACT
  Filled 2012-06-05 (×10): qty 2.5

## 2012-06-05 NOTE — ED Provider Notes (Signed)
Placed in CDU awaiting admission.  Patient coming from nursing home with nausea and emesis for one week with general abdominal pain. No pinpoint abdominal pain. Labs unremarkable - no leukocytosis, electrolytes within normal limits, lipase within normal limits, negative troponin, negative chest x-ray, negative abdominal x-ray. The patient has had her appendix and her gallbladder removed. She had multiple rounds of Zofran after which she continues to vomit. Emesis is mucous-like, nonbilious nonbloody. She remains afebrile and not tachycardic here in the department.  Zofran redosed.  Consult for admission.  5:51 PM   Consults tried hospitalist for admission. They will assess and admit.  Dahlia Client Nyrie Sigal, PA-C 06/05/12 [id number]

## 2012-06-05 NOTE — ED Provider Notes (Signed)
History     CSN: [id number]  Arrival date & time 06/05/12  1409   First MD Initiated Contact with Patient 06/05/12 1427      Chief Complaint  Patient presents with  . Nausea  . Emesis    (Consider location/radiation/quality/duration/timing/severity/associated sxs/prior treatment) Patient is a 78 y.o. female presenting with vomiting. The history is provided by the patient and a relative.  Emesis  This is a new problem. Episode onset: 4 days ago. The problem occurs 5 to 10 times per day. The problem has been gradually worsening. The emesis has an appearance of stomach contents (Vomitus was blood-tinged today). The maximum temperature recorded prior to her arrival was 102 to 102.9 F. The fever has been present for 1 to 2 days. Associated symptoms include chills, diarrhea and a fever. Pertinent negatives include no abdominal pain, no cough and no URI. Risk factors: none.  no recent abx.    Past Medical History  Diagnosis Date  . Stroke   . Pneumonia   . COPD (chronic obstructive pulmonary disease)   . Anxiety   . Hypertension   . Depression     No past surgical history on file.  No family history on file.  History  Substance Use Topics  . Smoking status: Former Games developer  . Smokeless tobacco: Not on file  . Alcohol Use: No    OB History    Grav Para Term Preterm Abortions TAB SAB Ect Mult Living                  Review of Systems  Constitutional: Positive for fever and chills.  Respiratory: Negative for cough.   Gastrointestinal: Positive for vomiting and diarrhea. Negative for abdominal pain.  All other systems reviewed and are negative.    Allergies  Codeine and Prozac  Home Medications   Current Outpatient Rx  Name Route Sig Dispense Refill  . ALENDRONATE SODIUM 70 MG PO TABS Oral Take 70 mg by mouth every 7 (seven) days. Take with a full glass of water on an empty stomach.    Marland Kitchen CALCIUM CARBONATE-VITAMIN D 500-200 MG-UNIT PO TABS Oral Take 1 tablet by mouth  daily.    Marland Kitchen CARVEDILOL 3.125 MG PO TABS Oral Take 3.125 mg by mouth 2 (two) times daily with a meal.    . CITALOPRAM HYDROBROMIDE 20 MG PO TABS Oral Take 20 mg by mouth daily.    Marland Kitchen CLOPIDOGREL BISULFATE 75 MG PO TABS Oral Take 75 mg by mouth daily.    Marland Kitchen FLUOCINONIDE 0.05 % EX CREA Topical Apply 1 application topically 2 (two) times daily.    Marland Kitchen FLUTICASONE-SALMETEROL 250-50 MCG/DOSE IN AEPB Inhalation Inhale 1 puff into the lungs every 12 (twelve) hours.    . IPRATROPIUM BROMIDE 0.02 % IN SOLN Nebulization Take 500 mcg by nebulization 4 (four) times daily.    Marland Kitchen LOPERAMIDE HCL 2 MG PO CAPS Oral Take 2 mg by mouth 4 (four) times daily as needed.    Marland Kitchen MAGNESIUM OXIDE 400 MG PO TABS Oral Take 400 mg by mouth daily.    . OXYCODONE HCL 5 MG PO TABS Oral Take 5-10 mg by mouth every 4 (four) hours as needed. For pain    . PANTOPRAZOLE SODIUM 40 MG PO TBEC Oral Take 40 mg by mouth daily.    Marland Kitchen PREDNISONE 1 MG PO TABS Oral Take 3 mg by mouth daily.    Marland Kitchen SACCHAROMYCES BOULARDII 250 MG PO CAPS Oral Take 250 mg by mouth 2 (  two) times daily.      BP 136/73  Pulse 113  Temp 102.4 F (39.1 C) (Oral)  Resp 18  SpO2 90%  Physical Exam  Nursing note and vitals reviewed. Constitutional: She is oriented to person, place, and time. She appears well-developed and well-nourished. No distress.  HENT:  Head: Normocephalic and atraumatic.  Mouth/Throat: Oropharynx is clear and moist. Mucous membranes are dry.  Eyes: Conjunctivae normal and EOM are normal. Pupils are equal, round, and reactive to light.  Neck: Normal range of motion. Neck supple.  Cardiovascular: Regular rhythm and intact distal pulses.  Tachycardia present.   No murmur heard. Pulmonary/Chest: Effort normal and breath sounds normal. No respiratory distress. She has no wheezes. She has no rales.  Abdominal: Soft. She exhibits no distension. Bowel sounds are absent. There is no tenderness. There is no rebound and no guarding.  Musculoskeletal:  Normal range of motion. She exhibits no edema and no tenderness.  Neurological: She is alert and oriented to person, place, and time.  Skin: Skin is warm and dry. No rash noted. No erythema. There is pallor.  Psychiatric: She has a normal mood and affect. Her behavior is normal.    ED Course  Procedures (including critical care time)  Labs Reviewed  CBC WITH DIFFERENTIAL - Abnormal; Notable for the following:    Hemoglobin 11.8 (*)     Neutrophils Relative 82 (*)     Neutro Abs 8.3 (*)     Lymphocytes Relative 5 (*)     Lymphs Abs 0.5 (*)     Monocytes Absolute 1.2 (*)     All other components within normal limits  POCT I-STAT TROPONIN I  COMPREHENSIVE METABOLIC PANEL  LIPASE, BLOOD  URINALYSIS, ROUTINE W REFLEX MICROSCOPIC   Dg Chest Portable 1 View  06/05/2012  *RADIOLOGY REPORT*  Clinical Data: Nausea, vomiting, shortness of breath, weakness, former smoker, COPD, hypertension  PORTABLE CHEST - 1 VIEW  Comparison: Portable exam 1436 hours compared to 05/01/2011  Findings: Upper normal heart size. Calcified mildly tortuous thoracic aorta. Mediastinal contours and pulmonary vascularity otherwise normal. Lungs appear emphysematous but clear. No pleural effusion or pneumothorax. Cardiac monitoring leads project over the lower chest. Bones diffusely demineralized.  IMPRESSION: COPD. No acute abnormalities.   Original Report Authenticated By: Lollie Marrow, M.D.     Date: 06/05/2012  Rate: 103  Rhythm: sinus tachycardia  QRS Axis: normal  Intervals: normal  ST/T Wave abnormalities: normal and nonspecific ST/T changes  Conduction Disutrbances:none  Narrative Interpretation:   Old EKG Reviewed: unchanged    No diagnosis found.    MDM   Patient with persistent vomiting, fever without pinpoint abdominal tenderness. Patient denies any dysuria, chest pain or shortness of breath. Patient had a chest x-ray done at her nursing home yesterday and was placed on antibiotics due to being  told that she had a possible pneumonia however x-ray today is unrevealing. Patient has a temperature of 102 and was given rectal Tylenol. CBC CMP, lipase, UA pending. Patient given IV fluids and a KUB pending.  Currently HD stable.        Gwyneth Sprout, MD 06/08/12 1553

## 2012-06-05 NOTE — H&P (Signed)
[facility] History and Physical  Erica French ZOX:[id number] DOB: 08/07/1930 DOA: 06/05/2012   PCP: Sheila Oats, MD   Chief Complaint: Fever, intractable vomiting  HPI:  78 year old female who presents to the emergency department from [facility] with fever and intractable vomiting. The patient and her daughters describe the vomiting as occasional spitting up as well as intermittent retching. This has persisted since Monday. The patient was in her usual state of health prior to this. She denies eating any raw or undercooked foods. There has been no sick contacts. There has been no changes in her medications. The patient states that she has not had fevers until today, but her daughters were skeptical of this fact it was reported from the nursing home. The patient denies any hematemesis, hematochezia, melena, dysuria, hematuria, rashes, neck pain or stiffness. She did develop a frontal headache this afternoon. However, her daughters state that the patient's mentation is good as usual. The patient began having abdominal pain this morning rating at 4/10. The patient denies any chest pain, shortness of breath, coughing, hemoptysis, synovitis, diarrhea. In fact, the patient is somewhat constipated. She has not had a bowel movement since Monday. The patient has baseline left-sided weakness from her stroke in 2009. This has not worsened. Assessment/Plan: Fever with intractable vomiting and abdominal pain -Considerations include intra-abdominal infections versus bacteremia -Patient has had a history of appendectomy and cholecystectomy -UA is without pyuria -Blood cultures x2 sets -Start empiric Zosyn after blood cultures -CT abdomen and pelvis with IV and oral contrast -1 view abdomen shows abundant stool without any obstruction, chest x-ray is clear -Lipase was unremarkable as were her liver enzymes -Start intravenous fluids -Continue scheduled Zofran around the  clock Hypokalemia -Will replete History of stroke -Continue Plavix Hypertension -Continue carvedilol COPD -Continue Atrovent and Advair        Past Medical History  Diagnosis Date  . Stroke   . Pneumonia   . COPD (chronic obstructive pulmonary disease)   . Anxiety   . Hypertension   . Depression    No past surgical history on file. Social History:  reports that she has quit smoking. She does not have any smokeless tobacco history on file. She reports that she does not drink alcohol or use illicit drugs.  Allergies  Allergen Reactions  . Codeine   . Prozac (Fluoxetine Hcl)     No family history on file.  Prior to Admission medications   Medication Sig Start Date End Date Taking? Authorizing Provider  alendronate (FOSAMAX) 70 MG tablet Take 70 mg by mouth every 7 (seven) days. Take with a full glass of water on an empty stomach.   Yes Historical Provider, MD  calcium-vitamin D (OSCAL WITH D) 500-200 MG-UNIT per tablet Take 1 tablet by mouth daily.   Yes Historical Provider, MD  carvedilol (COREG) 3.125 MG tablet Take 3.125 mg by mouth 2 (two) times daily with a meal.   Yes Historical Provider, MD  citalopram (CELEXA) 20 MG tablet Take 20 mg by mouth daily.   Yes Historical Provider, MD  clopidogrel (PLAVIX) 75 MG tablet Take 75 mg by mouth daily.   Yes Historical Provider, MD  fluocinonide cream (LIDEX) 0.05 % Apply 1 application topically 2 (two) times daily.   Yes Historical Provider, MD  Fluticasone-Salmeterol (ADVAIR) 250-50 MCG/DOSE AEPB Inhale 1 puff into the lungs every 12 (twelve) hours.   Yes Historical Provider, MD  ipratropium (ATROVENT) 0.02 % nebulizer solution Take 500 mcg by nebulization 4 (four) times daily.  Yes Historical Provider, MD  loperamide (IMODIUM) 2 MG capsule Take 2 mg by mouth 4 (four) times daily as needed.   Yes Historical Provider, MD  magnesium oxide (MAG-OX) 400 MG tablet Take 400 mg by mouth daily.   Yes Historical Provider, MD   oxyCODONE (OXY IR/ROXICODONE) 5 MG immediate release tablet Take 5-10 mg by mouth every 4 (four) hours as needed. For pain   Yes Historical Provider, MD  pantoprazole (PROTONIX) 40 MG tablet Take 40 mg by mouth daily.   Yes Historical Provider, MD  predniSONE (DELTASONE) 1 MG tablet Take 3 mg by mouth daily.   Yes Historical Provider, MD  saccharomyces boulardii (FLORASTOR) 250 MG capsule Take 250 mg by mouth 2 (two) times daily.   Yes Historical Provider, MD  sennosides-docusate sodium (SENOKOT-S) 8.6-50 MG tablet Take 1 tablet by mouth daily.   Yes Historical Provider, MD    Review of Systems:  Constitutional:  No weight loss, night sweats, Fevers, chills, fatigue.  Head&Eyes: No headache.  No vision loss.  No eye pain or scotoma ENT:  No Difficulty swallowing,Tooth/dental problems,Sore throat,  No ear ache, post nasal drip,  Cardio-vascular:  No chest pain, Orthopnea, PND, swelling in lower extremities,  dizziness, palpitations  GI:  Intractable vomiting and abdominal pain. Decreased appetite and oral intake . Resp:  No shortness of breath with exertion or at rest. No excess mucus, no productive cough, No non-productive cough, No coughing up of blood.No change in color of mucus.No wheezing.No chest wall deformity  Skin:  no rash or lesions.  GU:  no dysuria, change in color of urine, no urgency or frequency. No flank pain.  Musculoskeletal:  No joint pain or swelling. No decreased range of motion. No back pain.  Psych:  No change in mood or affect. No depression or anxiety. Neurologic: no dysesthesia, no focal weakness, no vision loss. No syncope; bifrontal headache  Physical Exam: Filed Vitals:   06/05/12 1409 06/05/12 1413 06/05/12 1745 06/05/12 1900  BP:  136/73 117/62 128/60  Pulse:  113 90 91  Temp:  102.4 F (39.1 C) 99.3 F (37.4 C)   TempSrc:  Oral Oral   Resp:  18 16   SpO2: 99% 90% 98% 98%   General:  A&O x 3, NAD, nontoxic, pleasant/cooperative Head/Eye:  No conjunctival hemorrhage, no icterus, [place]/AT, No nystagmus ENT:  No icterus,  No thrush, good dentition, no pharyngeal exudate Neck:  No masses, no lymphadenpathy, no bruits CV:  RRR, no rub, no gallop, no S3 Lung:   diminished breath sounds at the bases but clear to auscultation, good air movement, no wheeze, no rhonchi Abdomen: soft, +BS, nondistended, no peritoneal signs; mild epigastric tenderness without any guarding or peritoneal signs  Ext: No cyanosis, No rashes, No petechiae, No lymphangitis, No edema Neuro: CNII-XII intact, no dysmetria; plantar response flexor bilaterally; strength is 4-/5 bilateral lower extremities; 1/5 left upper extremity,4-/5 right upper extremity Labs on Admission:  Basic Metabolic Panel:  Lab 06/05/12 2956  NA 143  K 3.2*  CL 107  CO2 26  GLUCOSE 116*  BUN 16  CREATININE 0.74  CALCIUM 8.8  MG --  PHOS --   Liver Function Tests:  Lab 06/05/12 1436  AST 17  ALT 8  ALKPHOS 51  BILITOT 0.5  PROT 6.5  ALBUMIN 2.9*    Lab 06/05/12 1436  LIPASE 15  AMYLASE --   No results found for this basename: AMMONIA:5 in the last 168 hours CBC:  Lab 06/05/12  1436  WBC 10.1  NEUTROABS 8.3*  HGB 11.8*  HCT 36.5  MCV 90.3  PLT 228   Cardiac Enzymes: No results found for this basename: CKTOTAL:5,CKMB:5,CKMBINDEX:5,TROPONINI:5 in the last 168 hours BNP: No components found with this basename: POCBNP:5 CBG: No results found for this basename: GLUCAP:5 in the last 168 hours  Radiological Exams on Admission: Dg Abd 1 View  06/05/2012  *RADIOLOGY REPORT*  Clinical Data: Nausea, vomiting  ABDOMEN - 1 VIEW  Comparison: 03/15/2010  Findings: There is nonspecific nonobstructive bowel gas pattern. Mild levoscoliosis lumbar spine again noted.  Diffuse osteopenia. Mild degenerative changes bilateral hip joints.  Mild degenerative changes lumbar spine.  Prior vertebral plasty at L2 and L3 level. Post cholecystectomy surgical clips are noted.  IMPRESSION:  Nonspecific nonobstructive bowel gas pattern. Diffuse osteopenia. Post cholecystectomy surgical clips.  Again noted mild lumbar levoscoliosis.  Mild degenerative changes lumbar spine and bilateral hip joints.   Original Report Authenticated By: Natasha Mead, M.D.    Dg Chest Portable 1 View  06/05/2012  *RADIOLOGY REPORT*  Clinical Data: Nausea, vomiting, shortness of breath, weakness, former smoker, COPD, hypertension  PORTABLE CHEST - 1 VIEW  Comparison: Portable exam 1436 hours compared to 05/01/2011  Findings: Upper normal heart size. Calcified mildly tortuous thoracic aorta. Mediastinal contours and pulmonary vascularity otherwise normal. Lungs appear emphysematous but clear. No pleural effusion or pneumothorax. Cardiac monitoring leads project over the lower chest. Bones diffusely demineralized.  IMPRESSION: COPD. No acute abnormalities.   Original Report Authenticated By: Lollie Marrow, M.D.         Time spend:70 minutes Code Status: full Family Communication: daughters at bedside   Katiejo Gilroy, DO  [facility] Pager [phone]  If 7PM-7AM, please contact night-coverage [email or web address] Password [facility] 06/05/2012, 7:55 PM

## 2012-06-05 NOTE — ED Notes (Signed)
From nursing home nausea and emesis for one week with general abdominal achy ness from emesis.  EMS gave xofran 4mg  IV and KUB xray taken at nursing home results pending.

## 2012-06-05 NOTE — ED Notes (Signed)
Daughter at bedside and patient both state concerned about possible fever this week at nursing home,

## 2012-06-06 ENCOUNTER — Inpatient Hospital Stay (HOSPITAL_COMMUNITY): Payer: Medicare Other

## 2012-06-06 LAB — CBC WITH DIFFERENTIAL/PLATELET
Eosinophils Absolute: 0 10*3/uL (ref 0.0–0.7)
Eosinophils Relative: 0 % (ref 0–5)
Hemoglobin: 10.7 g/dL — ABNORMAL LOW (ref 12.0–15.0)
Lymphocytes Relative: 9 % — ABNORMAL LOW (ref 12–46)
Lymphs Abs: 0.8 10*3/uL (ref 0.7–4.0)
MCH: 29 pg (ref 26.0–34.0)
MCV: 90 fL (ref 78.0–100.0)
Monocytes Relative: 3 % (ref 3–12)
Neutrophils Relative %: 88 % — ABNORMAL HIGH (ref 43–77)
Platelets: 226 10*3/uL (ref 150–400)
RBC: 3.69 MIL/uL — ABNORMAL LOW (ref 3.87–5.11)
WBC: 9 10*3/uL (ref 4.0–10.5)

## 2012-06-06 LAB — TSH: TSH: 1.033 u[IU]/mL (ref 0.350–4.500)

## 2012-06-06 LAB — BASIC METABOLIC PANEL
BUN: 11 mg/dL (ref 6–23)
Chloride: 113 mEq/L — ABNORMAL HIGH (ref 96–112)
Glucose, Bld: 83 mg/dL (ref 70–99)
Potassium: 4.6 mEq/L (ref 3.5–5.1)
Sodium: 144 mEq/L (ref 135–145)

## 2012-06-06 MED ORDER — METHYLPREDNISOLONE SODIUM SUCC 40 MG IJ SOLR
20.0000 mg | Freq: Every day | INTRAMUSCULAR | Status: DC
  Administered 2012-06-06: 20 mg via INTRAVENOUS
  Filled 2012-06-06: qty 0.5

## 2012-06-06 MED ORDER — IOHEXOL 300 MG/ML  SOLN
100.0000 mL | Freq: Once | INTRAMUSCULAR | Status: AC | PRN
  Administered 2012-06-06: 100 mL via INTRAVENOUS

## 2012-06-06 MED ORDER — METHYLPREDNISOLONE SODIUM SUCC 40 MG IJ SOLR: 20.0000 mg | Freq: Every day | INTRAMUSCULAR | Status: DC

## 2012-06-06 MED ORDER — PREDNISONE 1 MG PO TABS
3.0000 mg | ORAL_TABLET | Freq: Every day | ORAL | Status: DC
  Administered 2012-06-07 – 2012-06-11 (×5): 3 mg via ORAL
  Filled 2012-06-06 (×6): qty 3

## 2012-06-06 NOTE — Progress Notes (Signed)
[facility] PROGRESS NOTE  Erica French WUJ:[id number] DOB: 1930/07/28 DOA: 06/05/2012 PCP: Sheila Oats, MD  Assessment/Plan: Fever with intractable vomiting and abdominal pain  -Possibly gastroenteritis with rapid improvement -Patient has had a history of appendectomy and cholecystectomy  -No fevers in the past 24 hour -UA is without pyuria  -Blood cultures  negative at 24 hours -Continue empiric Zosyn after blood cultures  -Dilated biliary ducts on CT abdomen and pelvis without any other acute abnormalities -1 view abdomen shows abundant stool without any obstruction, chest x-ray is clear  -Lipase was unremarkable as were her liver enzymes  -Continue intravenous fluids  -Continue scheduled Zofran around the clock  -advanced to clear liquid diet Hypokalemia  -Will replete  History of stroke  -Continue Plavix  Hypertension  -Continue carvedilol  COPD  -Continue Atrovent and Advair History of temporal arteritis -Switch back to prednisone given the patient's clinical improvement      Family Communication:   Daughters at the bedside Disposition Plan:   [facility] when medically stable     Antibiotics:  Zosyn September 20>>>    Procedures/Studies: Dg Abd 1 View  06/05/2012  *RADIOLOGY REPORT*  Clinical Data: Nausea, vomiting  ABDOMEN - 1 VIEW  Comparison: 03/15/2010  Findings: There is nonspecific nonobstructive bowel gas pattern. Mild levoscoliosis lumbar spine again noted.  Diffuse osteopenia. Mild degenerative changes bilateral hip joints.  Mild degenerative changes lumbar spine.  Prior vertebral plasty at L2 and L3 level. Post cholecystectomy surgical clips are noted.  IMPRESSION: Nonspecific nonobstructive bowel gas pattern. Diffuse osteopenia. Post cholecystectomy surgical clips.  Again noted mild lumbar levoscoliosis.  Mild degenerative changes lumbar spine and bilateral hip joints.   Original Report Authenticated By: Natasha Mead, M.D.    Ct Abdomen  Pelvis W Contrast  06/06/2012  *RADIOLOGY REPORT*  Clinical Data: Fever, abdominal pain and vomiting  CT ABDOMEN AND PELVIS WITH CONTRAST  Technique:  Multidetector CT imaging of the abdomen and pelvis was performed following the standard protocol during bolus administration of intravenous contrast.  Contrast: OMNIPAQUE IOHEXOL 300 MG/ML  SOLN  Comparison: 04/19/2011  Findings: There is a small left pleural effusion.  Lung bases are otherwise clear with changes of emphysema.  No focal liver abnormalities identified.  There is marked dilatation of the common bile duct which measures up to 1.6 cm, status post cholecystectomy.  Moderate intrahepatic bile duct dilatation is identified.  On 04/02/2010 the CBD measured 1.4 cm. No common bile duct stone or obstructing mass noted.  The pancreas appears within normal limits.  The spleen is unremarkable.  Bilateral adrenal glands appear normal.  Multiple bilateral renal cysts are identified. The kidneys are otherwise unremarkable.  No obstructive uropathy.  The urinary bladder appears within normal limits.  The uterus and the adnexal structures are unremarkable.  Borderline enlarged gastrohepatic ligament lymph node measures 1.1 cm, image 16. No pelvic or inguinal adenopathy identified.  No free fluid or fluid collections noted within the abdomen or pelvis.  Advanced calcified atherosclerotic disease affects the abdominal aorta and its branches.  The stomach appears within normal limits.  The small bowel loops are normal no evidence for bowel obstruction.  Normal appearance of the colon.  There is a scoliosis deformity involving the lumbar spine which is convex to the right.  There are compression deformities involving the HU and L3 vertebra. These have been treated with bone cement.  No acute bony abnormalities noted.  IMPRESSION:  1.  Chronic marked dilatation of the common bile duct  with moderate intrahepatic bile duct dilatation.  No obstructing stone or mass  noted. 2.  Borderline enlarged gastrohepatic ligament lymph node. 3.  Small left pleural effusion.   Original Report Authenticated By: Rosealee Albee, M.D.    Dg Chest Portable 1 View  06/05/2012  *RADIOLOGY REPORT*  Clinical Data: Nausea, vomiting, shortness of breath, weakness, former smoker, COPD, hypertension  PORTABLE CHEST - 1 VIEW  Comparison: Portable exam 1436 hours compared to 05/01/2011  Findings: Upper normal heart size. Calcified mildly tortuous thoracic aorta. Mediastinal contours and pulmonary vascularity otherwise normal. Lungs appear emphysematous but clear. No pleural effusion or pneumothorax. Cardiac monitoring leads project over the lower chest. Bones diffusely demineralized.  IMPRESSION: COPD. No acute abnormalities.   Original Report Authenticated By: Lollie Marrow, M.D.          Subjective: Patient feeling significantly better today. Denies any fevers, chills, chest pain, shortness of breath, nausea, vomiting, diarrhea, abdominal pain. She feels hungry and wants to eat a "bear"  Objective: Filed Vitals:   06/06/12 0631 06/06/12 1127 06/06/12 1513 06/06/12 1521  BP: 120/68   166/70  Pulse: 70   73  Temp: 97.9 F (36.6 C)   97.5 F (36.4 C)  TempSrc:    Axillary  Resp: 18   18  SpO2: 95% 98% 95% 100%    Intake/Output Summary (Last 24 hours) at 06/06/12 1731 Last data filed at 06/06/12 1500  Gross per 24 hour  Intake 616.25 ml  Output    500 ml  Net 116.25 ml   Weight change:  Exam:   General:  Pt is alert, follows commands appropriately, not in acute distress  HEENT: No icterus, No thrush,  [place]/AT  Cardiovascular: RRR, S1/S2, no rubs, no gallops  Respiratory: Clear to auscultation bilaterally, no wheezing, no crackles, no rhonchi  Abdomen: Soft, non tender, non distended, bowel sounds present, no guarding  Extremities: No edema, No lymphangitis, No petechiae, No rashes, no synovitis  Data Reviewed: Basic Metabolic Panel:  Lab 06/06/12 4010  06/05/12 1436  NA 144 143  K 4.6 3.2*  CL 113* 107  CO2 23 26  GLUCOSE 83 116*  BUN 11 16  CREATININE 0.62 0.74  CALCIUM 7.8* 8.8  MG 1.8 --  PHOS -- --   Liver Function Tests:  Lab 06/05/12 1436  AST 17  ALT 8  ALKPHOS 51  BILITOT 0.5  PROT 6.5  ALBUMIN 2.9*    Lab 06/05/12 1436  LIPASE 15  AMYLASE --   No results found for this basename: AMMONIA:5 in the last 168 hours CBC:  Lab 06/06/12 0615 06/05/12 1436  WBC 9.0 10.1  NEUTROABS 7.9* 8.3*  HGB 10.7* 11.8*  HCT 33.2* 36.5  MCV 90.0 90.3  PLT 226 228   Cardiac Enzymes: No results found for this basename: CKTOTAL:5,CKMB:5,CKMBINDEX:5,TROPONINI:5 in the last 168 hours BNP: No components found with this basename: POCBNP:5 CBG: No results found for this basename: GLUCAP:5 in the last 168 hours  No results found for this or any previous visit (from the past 240 hour(s)).   Scheduled Meds:   . carvedilol  3.125 mg Oral BID WC  . citalopram  20 mg Oral Daily  . clopidogrel  75 mg Oral Daily  . diphenhydrAMINE  25 mg Intravenous Once  . enoxaparin (LOVENOX) injection  40 mg Subcutaneous Q24H  . Fluticasone-Salmeterol  1 puff Inhalation Q12H  . ipratropium  500 mcg Nebulization QID  . magnesium oxide  400 mg Oral Daily  .  methylPREDNISolone (SOLU-MEDROL) injection  20 mg Intravenous Daily  . metoCLOPramide (REGLAN) injection  10 mg Intravenous STAT  . ondansetron  8 mg Intravenous Q6H  . ondansetron (ZOFRAN) IV  4 mg Intravenous STAT  . piperacillin-tazobactam (ZOSYN)  IV  3.375 g Intravenous Q8H  . sennosides-docusate sodium  1 tablet Oral Daily  . DISCONTD: methylPREDNISolone (SOLU-MEDROL) injection  20 mg Intravenous Daily  . DISCONTD: ondansetron  8 mg Intravenous Q6H  . DISCONTD: piperacillin-tazobactam  3.375 g Intravenous Q8H  . DISCONTD: predniSONE  3 mg Oral Daily   Continuous Infusions:   . DISCONTD: 0.9 % NaCl with KCl 20 mEq / L 75 mL/hr at 06/05/12 2347     Brently Voorhis, DO  [facility] Pager [phone]  If 7PM-7AM, please contact night-coverage [email or web address] Password [email or web address] 06/06/2012, 5:31 PM   LOS: 1 day

## 2012-06-07 LAB — COMPREHENSIVE METABOLIC PANEL
Alkaline Phosphatase: 46 U/L (ref 39–117)
BUN: 15 mg/dL (ref 6–23)
Chloride: 106 mEq/L (ref 96–112)
Creatinine, Ser: 0.78 mg/dL (ref 0.50–1.10)
GFR calc Af Amer: 89 mL/min — ABNORMAL LOW (ref >90)
GFR calc non Af Amer: 77 mL/min — ABNORMAL LOW (ref >90)
Glucose, Bld: 77 mg/dL (ref 70–99)
Potassium: 3 mEq/L — ABNORMAL LOW (ref 3.5–5.1)
Total Bilirubin: 0.4 mg/dL (ref 0.3–1.2)

## 2012-06-07 LAB — TROPONIN I
Troponin I: <0.3 ng/mL (ref <0.30)
Troponin I: <0.3 ng/mL (ref <0.30)

## 2012-06-07 LAB — URINE CULTURE: Culture: NO GROWTH

## 2012-06-07 LAB — CBC
HCT: 31.5 % — ABNORMAL LOW (ref 36.0–46.0)
Hemoglobin: 10.2 g/dL — ABNORMAL LOW (ref 12.0–15.0)
MCV: 89 fL (ref 78.0–100.0)
RDW: 13.6 % (ref 11.5–15.5)
WBC: 7.3 10*3/uL (ref 4.0–10.5)

## 2012-06-07 MED ORDER — GI COCKTAIL ~~LOC~~
30.0000 mL | Freq: Once | ORAL | Status: AC
  Administered 2012-06-07: 30 mL via ORAL
  Filled 2012-06-07: qty 30

## 2012-06-07 MED ORDER — TRAZODONE HCL 50 MG PO TABS
50.0000 mg | ORAL_TABLET | Freq: Every day | ORAL | Status: DC
  Administered 2012-06-07 – 2012-06-10 (×4): 50 mg via ORAL
  Filled 2012-06-07 (×6): qty 1

## 2012-06-07 MED ORDER — POTASSIUM CHLORIDE 20 MEQ/15ML (10%) PO LIQD
40.0000 meq | Freq: Once | ORAL | Status: AC
  Administered 2012-06-07: 40 meq via ORAL
  Filled 2012-06-07: qty 30

## 2012-06-07 NOTE — Progress Notes (Signed)
[facility] PROGRESS NOTE  Erica French ZOX:[id number] DOB: [birth year] DOA: 06/05/2012 PCP: Sheila Oats, MD  Assessment/Plan: Fever with intractable vomiting and abdominal pain  -tolerating liquids, wants "real" food -Possibly gastroenteritis with rapid improvement  -Patient has had a history of appendectomy and cholecystectomy  -No fevers in the past 48 hour  -UA is without pyuria  -Blood cultures negative at 24 hours  -d/C zosyn -Dilated biliary ducts on CT abdomen and pelvis without any other acute abnormalities  -1 view abdomen shows abundant stool without any obstruction, chest x-ray is clear  -Lipase was unremarkable as were her liver enzymes  -saline lockintravenous fluids  -Continue scheduled Zofran around the clock  -advanced to clear liquid diet  Hypokalemia  -Will replete  History of stroke  -Continue Plavix  Hypertension  -Continue carvedilol  COPD  -Continue Atrovent and Advair  History of temporal arteritis  -Switch back to prednisone given the patient's clinical improvement Chest pain -Occurs with swallowing sprite, also reproducible on palpation -Try GI cocktail -Serial troponins, EKG     Family Communication:   Daughter at bedside Disposition Plan:   Home when medically stable     Antibiotics: Zosyn September 20>>> September 22   Procedures/Studies: Dg Abd 1 View  06/05/2012  *RADIOLOGY REPORT*  Clinical Data: Nausea, vomiting  ABDOMEN - 1 VIEW  Comparison: 03/15/2010  Findings: There is nonspecific nonobstructive bowel gas pattern. Mild levoscoliosis lumbar spine again noted.  Diffuse osteopenia. Mild degenerative changes bilateral hip joints.  Mild degenerative changes lumbar spine.  Prior vertebral plasty at L2 and L3 level. Post cholecystectomy surgical clips are noted.  IMPRESSION: Nonspecific nonobstructive bowel gas pattern. Diffuse osteopenia. Post cholecystectomy surgical clips.  Again noted mild lumbar levoscoliosis.  Mild  degenerative changes lumbar spine and bilateral hip joints.   Original Report Authenticated By: Natasha Mead, M.D.    Ct Abdomen Pelvis W Contrast  06/06/2012  *RADIOLOGY REPORT*  Clinical Data: Fever, abdominal pain and vomiting  CT ABDOMEN AND PELVIS WITH CONTRAST  Technique:  Multidetector CT imaging of the abdomen and pelvis was performed following the standard protocol during bolus administration of intravenous contrast.  Contrast: OMNIPAQUE IOHEXOL 300 MG/ML  SOLN  Comparison: 04/19/2011  Findings: There is a small left pleural effusion.  Lung bases are otherwise clear with changes of emphysema.  No focal liver abnormalities identified.  There is marked dilatation of the common bile duct which measures up to 1.6 cm, status post cholecystectomy.  Moderate intrahepatic bile duct dilatation is identified.  On 04/02/2010 the CBD measured 1.4 cm. No common bile duct stone or obstructing mass noted.  The pancreas appears within normal limits.  The spleen is unremarkable.  Bilateral adrenal glands appear normal.  Multiple bilateral renal cysts are identified. The kidneys are otherwise unremarkable.  No obstructive uropathy.  The urinary bladder appears within normal limits.  The uterus and the adnexal structures are unremarkable.  Borderline enlarged gastrohepatic ligament lymph node measures 1.1 cm, image 16. No pelvic or inguinal adenopathy identified.  No free fluid or fluid collections noted within the abdomen or pelvis.  Advanced calcified atherosclerotic disease affects the abdominal aorta and its branches.  The stomach appears within normal limits.  The small bowel loops are normal no evidence for bowel obstruction.  Normal appearance of the colon.  There is a scoliosis deformity involving the lumbar spine which is convex to the right.  There are compression deformities involving the HU and L3 vertebra. These have been treated with bone cement.  No acute bony abnormalities noted.  IMPRESSION:  1.   Chronic marked dilatation of the common bile duct with moderate intrahepatic bile duct dilatation.  No obstructing stone or mass noted. 2.  Borderline enlarged gastrohepatic ligament lymph node. 3.  Small left pleural effusion.   Original Report Authenticated By: Rosealee Albee, M.D.    Dg Chest Portable 1 View  06/05/2012  *RADIOLOGY REPORT*  Clinical Data: Nausea, vomiting, shortness of breath, weakness, former smoker, COPD, hypertension  PORTABLE CHEST - 1 VIEW  Comparison: Portable exam 1436 hours compared to 05/01/2011  Findings: Upper normal heart size. Calcified mildly tortuous thoracic aorta. Mediastinal contours and pulmonary vascularity otherwise normal. Lungs appear emphysematous but clear. No pleural effusion or pneumothorax. Cardiac monitoring leads project over the lower chest. Bones diffusely demineralized.  IMPRESSION: COPD. No acute abnormalities.   Original Report Authenticated By: Lollie Marrow, M.D.          Subjective: Patient complains of some chest burning when she swallows water and sprite.denies any shortness of breath, dizziness, pain down her arms or jaw pain. Denies fevers, chills. Feels hungry, wants to keep "real food". No abdominal pain. No dysuria.  Objective: Filed Vitals:   06/07/12 0533 06/07/12 0754 06/07/12 1137 06/07/12 1340  BP: 153/76   150/67  Pulse: 74   74  Temp: 98.4 F (36.9 C)   98.8 F (37.1 C)  TempSrc:    Oral  Resp: 17   18  SpO2: 97% 96% 97% 97%    Intake/Output Summary (Last 24 hours) at 06/07/12 1615 Last data filed at 06/06/12 1900  Gross per 24 hour  Intake    120 ml  Output      0 ml  Net    120 ml   Weight change:  Exam:   General:  Pt is alert, follows commands appropriately, not in acute distress  HEENT: No icterus, No thrush, [place]/AT  Cardiovascular: RRR, S1/S2, no rubs, no gallops  Respiratory: Clear to auscultation bilaterally, no wheezing, no crackles, no rhonchi  Abdomen: Soft/+BS, non tender, non distended,  no guarding  Extremities: No edema, No lymphangitis, No petechiae, No rashes, no synovitis  Data Reviewed: Basic Metabolic Panel:  Lab 06/07/12 1610 06/06/12 0615 06/05/12 1436  NA 141 144 143  K 3.0* 4.6 3.2*  CL 106 113* 107  CO2 22 23 26   GLUCOSE 77 83 116*  BUN 15 11 16   CREATININE 0.78 0.62 0.74  CALCIUM 8.5 7.8* 8.8  MG -- 1.8 --  PHOS -- -- --   Liver Function Tests:  Lab 06/07/12 0505 06/05/12 1436  AST 18 17  ALT 8 8  ALKPHOS 46 51  BILITOT 0.4 0.5  PROT 5.7* 6.5  ALBUMIN 2.5* 2.9*    Lab 06/05/12 1436  LIPASE 15  AMYLASE --   No results found for this basename: AMMONIA:5 in the last 168 hours CBC:  Lab 06/07/12 0505 06/06/12 0615 06/05/12 1436  WBC 7.3 9.0 10.1  NEUTROABS -- 7.9* 8.3*  HGB 10.2* 10.7* 11.8*  HCT 31.5* 33.2* 36.5  MCV 89.0 90.0 90.3  PLT 239 226 228   Cardiac Enzymes: No results found for this basename: CKTOTAL:5,CKMB:5,CKMBINDEX:5,TROPONINI:5 in the last 168 hours BNP: No components found with this basename: POCBNP:5 CBG: No results found for this basename: GLUCAP:5 in the last 168 hours  Recent Results (from the past 240 hour(s))  URINE CULTURE     Status: Normal   Collection Time   06/05/12  3:52 PM  Component Value Range Status Comment   Specimen Description URINE, CATHETERIZED   Final    Special Requests ADDED 06/05/12 2046   Final    Culture  Setup Time 06/05/2012 22:24   Final    Colony Count NO GROWTH   Final    Culture NO GROWTH   Final    Report Status 06/07/2012 FINAL   Final   CULTURE, BLOOD (ROUTINE X 2)     Status: Normal (Preliminary result)   Collection Time   06/05/12  6:45 PM      Component Value Range Status Comment   Specimen Description BLOOD HAND RIGHT   Final    Special Requests BOTTLES DRAWN AEROBIC AND ANAEROBIC 10CC   Final    Culture  Setup Time 06/06/2012 02:41   Final    Culture     Final    Value:        BLOOD CULTURE RECEIVED NO GROWTH TO DATE CULTURE WILL BE HELD FOR 5 DAYS BEFORE ISSUING  A FINAL NEGATIVE REPORT   Report Status PENDING   Incomplete   CULTURE, BLOOD (ROUTINE X 2)     Status: Normal (Preliminary result)   Collection Time   06/05/12  6:55 PM      Component Value Range Status Comment   Specimen Description BLOOD ARM RIGHT   Final    Special Requests BOTTLES DRAWN AEROBIC AND ANAEROBIC 10CC   Final    Culture  Setup Time 06/06/2012 02:41   Final    Culture     Final    Value:        BLOOD CULTURE RECEIVED NO GROWTH TO DATE CULTURE WILL BE HELD FOR 5 DAYS BEFORE ISSUING A FINAL NEGATIVE REPORT   Report Status PENDING   Incomplete      Scheduled Meds:   . carvedilol  3.125 mg Oral BID WC  . citalopram  20 mg Oral Daily  . clopidogrel  75 mg Oral Daily  . enoxaparin (LOVENOX) injection  40 mg Subcutaneous Q24H  . Fluticasone-Salmeterol  1 puff Inhalation Q12H  . gi cocktail  30 mL Oral Once  . ipratropium  500 mcg Nebulization QID  . magnesium oxide  400 mg Oral Daily  . ondansetron  8 mg Intravenous Q6H  . potassium chloride  40 mEq Oral Once  . predniSONE  3 mg Oral Q breakfast  . sennosides-docusate sodium  1 tablet Oral Daily  . traZODone  50 mg Oral QHS  . DISCONTD: methylPREDNISolone (SOLU-MEDROL) injection  20 mg Intravenous Daily  . DISCONTD: piperacillin-tazobactam (ZOSYN)  IV  3.375 g Intravenous Q8H   Continuous Infusions:    Erica Kaley, DO  [facility] Pager [phone]  If 7PM-7AM, please contact night-coverage [email or web address] Password [email or web address] 06/07/2012, 4:15 PM   LOS: 2 days

## 2012-06-08 ENCOUNTER — Encounter (HOSPITAL_COMMUNITY): Payer: Self-pay | Admitting: General Practice

## 2012-06-08 ENCOUNTER — Encounter (HOSPITAL_COMMUNITY)
Admission: EM | Disposition: A | Discharge status: Skilled Nursing Facility | Payer: Self-pay | Source: Home / Self Care | Attending: Internal Medicine

## 2012-06-08 DIAGNOSIS — Z8673 Personal history of transient ischemic attack (TIA), and cerebral infarction without residual deficits: Secondary | ICD-10-CM

## 2012-06-08 HISTORY — PX: ESOPHAGOGASTRODUODENOSCOPY: SHX5428

## 2012-06-08 LAB — CBC
HCT: 37.4 % (ref 36.0–46.0)
MCHC: 32.4 g/dL (ref 30.0–36.0)
Platelets: 286 10*3/uL (ref 150–400)
RDW: 13.4 % (ref 11.5–15.5)
WBC: 7.5 10*3/uL (ref 4.0–10.5)

## 2012-06-08 LAB — HEPATIC FUNCTION PANEL
AST: 23 U/L (ref 0–37)
Alkaline Phosphatase: 52 U/L (ref 39–117)
Bilirubin, Direct: <0.1 mg/dL (ref 0.0–0.3)
Total Bilirubin: 0.3 mg/dL (ref 0.3–1.2)

## 2012-06-08 LAB — BASIC METABOLIC PANEL
BUN: 10 mg/dL (ref 6–23)
Calcium: 8.9 mg/dL (ref 8.4–10.5)
Creatinine, Ser: 0.79 mg/dL (ref 0.50–1.10)
GFR calc Af Amer: 89 mL/min — ABNORMAL LOW (ref >90)
GFR calc non Af Amer: 77 mL/min — ABNORMAL LOW (ref >90)

## 2012-06-08 SURGERY — EGD (ESOPHAGOGASTRODUODENOSCOPY)
Anesthesia: Moderate Sedation

## 2012-06-08 MED ORDER — POTASSIUM CHLORIDE 20 MEQ/15ML (10%) PO LIQD
80.0000 meq | Freq: Once | ORAL | Status: DC
  Filled 2012-06-08: qty 60

## 2012-06-08 MED ORDER — PANTOPRAZOLE SODIUM 40 MG IV SOLR
40.0000 mg | INTRAVENOUS | Status: DC
  Administered 2012-06-08: 40 mg via INTRAVENOUS
  Filled 2012-06-08: qty 40

## 2012-06-08 MED ORDER — BUTAMBEN-TETRACAINE-BENZOCAINE 2-2-14 % EX AERO
INHALATION_SPRAY | CUTANEOUS | Status: DC | PRN
  Administered 2012-06-08: 2 via TOPICAL

## 2012-06-08 MED ORDER — FENTANYL CITRATE 0.05 MG/ML IJ SOLN
INTRAMUSCULAR | Status: DC | PRN
  Administered 2012-06-08 (×2): 12.5 ug via INTRAVENOUS

## 2012-06-08 MED ORDER — POTASSIUM CHLORIDE 10 MEQ/100ML IV SOLN
10.0000 meq | INTRAVENOUS | Status: AC
  Administered 2012-06-08 (×4): 10 meq via INTRAVENOUS
  Filled 2012-06-08 (×4): qty 100

## 2012-06-08 MED ORDER — PANTOPRAZOLE SODIUM 40 MG IV SOLR
40.0000 mg | Freq: Two times a day (BID) | INTRAVENOUS | Status: DC
  Administered 2012-06-08 – 2012-06-10 (×5): 40 mg via INTRAVENOUS
  Filled 2012-06-08 (×9): qty 40

## 2012-06-08 MED ORDER — MIDAZOLAM HCL 10 MG/2ML IJ SOLN
INTRAMUSCULAR | Status: DC | PRN
  Administered 2012-06-08 (×3): 1 mg via INTRAVENOUS

## 2012-06-08 MED ORDER — POTASSIUM CHLORIDE 20 MEQ/15ML (10%) PO LIQD
40.0000 meq | Freq: Once | ORAL | Status: AC
  Administered 2012-06-08: 40 meq via ORAL
  Filled 2012-06-08: qty 30

## 2012-06-08 MED ORDER — FENTANYL CITRATE 0.05 MG/ML IJ SOLN
INTRAMUSCULAR | Status: AC
  Filled 2012-06-08: qty 2

## 2012-06-08 MED ORDER — MIDAZOLAM HCL 5 MG/ML IJ SOLN
INTRAMUSCULAR | Status: AC
  Filled 2012-06-08: qty 2

## 2012-06-08 MED ORDER — IPRATROPIUM BROMIDE HFA 17 MCG/ACT IN AERS
2.0000 | INHALATION_SPRAY | Freq: Two times a day (BID) | RESPIRATORY_TRACT | Status: DC
  Administered 2012-06-08 – 2012-06-10 (×4): 2 via RESPIRATORY_TRACT
  Filled 2012-06-08: qty 12.9

## 2012-06-08 NOTE — Interval H&P Note (Signed)
History and Physical Interval Note:  06/08/2012 5:03 PM  Erica French  has presented today for surgery, with the diagnosis of nausea/vomiting/odonophagia  The various methods of treatment have been discussed with the patient and family. After consideration of risks, benefits and other options for treatment, the patient has consented to  Procedure(s) (LRB) with comments: ESOPHAGOGASTRODUODENOSCOPY (EGD) (N/A) as a surgical intervention .  The patient's history has been reviewed, patient examined, no change in status, stable for surgery.  I have reviewed the patient's chart and labs.  Questions were answered to the patient's satisfaction.     Florencia Reasons

## 2012-06-08 NOTE — Op Note (Signed)
[facility] [address] [place], [address]   ENDOSCOPY PROCEDURE REPORT  PATIENT: Erica French, Erica French  MR#: [id number] BIRTHDATE: June 12, 1930 , 82  yrs. old GENDER: Female ENDOSCOPIST:Arika Mainer, MD REFERRED BY:  unassigned PROCEDURE DATE:  06/08/2012 PROCEDURE:      Upper endoscopy ASA CLASS: INDICATIONS:   dysphagia and odynophagia, following recent clinical presentation with protracted vomiting of unclear cause MEDICATION:    fentanyl 25 mcg, Versed 3 mg IV TOPICAL ANESTHETIC:  DESCRIPTION OF PROCEDURE:   the patient was brought from her hospital room to the [facility] cone endoscopy unit. After consent, time out, and sedation, the Pentax video endoscope was passed under direct vision. The larynx looked normal in the esophagus was entered without difficulty.  The proximal esophagus was normal, the midesophagus had some mild erythema circumferentially, and the distal esophagus had circumferential erosive changes, without evidence of deep ulceration, mass effect, varices, Mallory-Weiss tear, infection, or neoplasia. A small hiatal hernia was present.  The stomach was entered. There was a minimal bilious residual present. The gastric mucosa was unremarkable in the pylorus was widely patent, without evidence of gastric outlet obstruction. On retroflexion the cardia was normal.  The duodenal bulb and second duodenum looked normal.  The scope was removed from the patient. No biopsies were obtained. She tolerated the procedure well, and there were no apparent complications.       COMPLICATIONS: None  ENDOSCOPIC IMPRESSION:  1. Moderately severe distal erosive esophagitis, circumferential, in a pattern consistent with her protracted vomiting history, more so than with reflux disease. 2. No endoscopically evident source of nausea and vomiting identified. It is felt most likely to be on an infectious basis, given the abrupt onset and  persistent character. RECOMMENDATIONS:  1. continue PPI therapy. I will increase from once daily to twice daily dosing to more completely control acid. I would continue twice daily PPI therapy until the patient's dysphagia symptoms have completely resolved. However, I do not feel that there is a clear indication for long-term maintenance PPI therapy in this patient. 2. As much as possible, remain upright or at least semi-recumbent rather than supine, to minimize gravitational augmentation of acid reflux 3. Okay for full liquid diet. I would not advance to a soft diet or regular diet until she is easily taking full liquids 4. I do not feel that routine followup endoscopy is required, since this appears to be in acute rather than a chronic endoscopic abnormality. 5. Sometimes, patients with the sort of esophagitis well-developed postinflammatory strictures, so the patient will have to be observed for the delayed onset of solid food dysphagia, in which case a barium swallow would help to determine whether a stricture is present. If present, cautious dilatation could be performed to relieve dysphagia symptoms   _______________________________ eSigned:  Bernette Redbird, MD 06/08/2012 5:52 PM    PATIENT NAME:  Erica French, Erica French MR#: [id number]

## 2012-06-08 NOTE — Progress Notes (Addendum)
Since my visit this morning to the patient the patient has been complaining of worsening odynophagia. I got a call from the nursing staff that the patient could not tolerate hardly any of her breakfast and started vomiting after eating some crackers. BMP and CBC were reviewed and were reassuring from this morning. Vital signs remained stable. Daughter was at the bedside. She was updated about the clinical situation. Overall, the patient is clinically improved from the time of admission but is still having difficulty tolerating her die tand now new onset odynophagia. I have consulted [facility] gastroenterology for their opinion. Patient has seen [facility] in the past and had endoscopy July 2011.  The patient's discharge has been canceled for today. LFTs and  morning labs for tomorrow have been order

## 2012-06-08 NOTE — Progress Notes (Signed)
CRITICAL VALUE ALERT  Critical value received:  Potassium 2.7  Date of notification:  06/08/2012  Time of notification:  0817  Critical value read back:yes  Nurse who received alert:  HD  MD notified (1st page): Dr. Arbutus Leas  Time of first page:  0819  MD notified (2nd page):  Time of second page:  Responding MD:    Time MD responded:

## 2012-06-08 NOTE — Progress Notes (Signed)
INITIAL ADULT NUTRITION ASSESSMENT Date: 06/08/2012   Time: 1:06 PM Reason for Assessment: MST  ASSESSMENT: Female 78 y.o.  Dx: nausea, vomiting  Hx:  Past Medical History  Diagnosis Date  . Stroke   . Pneumonia   . COPD (chronic obstructive pulmonary disease)   . Anxiety   . Hypertension   . Depression   . Thyroid nodule   . Headache     temple artiritis   Past Surgical History  Procedure Date  . Hernia repair   . Cholecystectomy   . Thyroid surgery     Related Meds:  Scheduled Meds:   . carvedilol  3.125 mg Oral BID WC  . citalopram  20 mg Oral Daily  . clopidogrel  75 mg Oral Daily  . enoxaparin (LOVENOX) injection  40 mg Subcutaneous Q24H  . Fluticasone-Salmeterol  1 puff Inhalation Q12H  . gi cocktail  30 mL Oral Once  . ipratropium  2 puff Inhalation BID  . magnesium oxide  400 mg Oral Daily  . pantoprazole (PROTONIX) IV  40 mg Intravenous Q24H  . potassium chloride  10 mEq Intravenous Q1 Hr x 4  . potassium chloride  40 mEq Oral Once  . potassium chloride  40 mEq Oral Once  . predniSONE  3 mg Oral Q breakfast  . sennosides-docusate sodium  1 tablet Oral Daily  . traZODone  50 mg Oral QHS  . DISCONTD: ipratropium  500 mcg Nebulization QID  . DISCONTD: ondansetron  8 mg Intravenous Q6H  . DISCONTD: piperacillin-tazobactam (ZOSYN)  IV  3.375 g Intravenous Q8H  . DISCONTD: potassium chloride  80 mEq Oral Once   Continuous Infusions:  PRN Meds:.acetaminophen, acetaminophen, ondansetron  Ht:  estimated 5'4"  Wt:  unable to obtain at this time  Ideal Wt:    120 lbs % Ideal Wt: unable to assess  Usual Wt: 126 lbs % Usual Wt: unable to assess  Food/Nutrition Related Hx: N/V PTA  Labs:  CMP     Component Value Date/Time   NA 142 06/08/2012 0655   K 2.7* 06/08/2012 0655   CL 104 06/08/2012 0655   CO2 27 06/08/2012 0655   GLUCOSE 79 06/08/2012 0655   BUN 10 06/08/2012 0655   CREATININE 0.79 06/08/2012 0655   CALCIUM 8.9 06/08/2012 0655   PROT 6.7  06/08/2012 1117   ALBUMIN 3.0* 06/08/2012 1117   AST 23 06/08/2012 1117   ALT 8 06/08/2012 1117   ALKPHOS 52 06/08/2012 1117   BILITOT 0.3 06/08/2012 1117   GFRNONAA 77* 06/08/2012 0655   GFRAA 89* 06/08/2012 0655    CBC    Component Value Date/Time   WBC 7.5 06/08/2012 0655   RBC 4.27 06/08/2012 0655   HGB 12.1 06/08/2012 0655   HCT 37.4 06/08/2012 0655   PLT 286 06/08/2012 0655   MCV 87.6 06/08/2012 0655   MCH 28.3 06/08/2012 0655   MCHC 32.4 06/08/2012 0655   RDW 13.4 06/08/2012 0655   LYMPHSABS 0.8 06/06/2012 0615   MONOABS 0.2 06/06/2012 0615   EOSABS 0.0 06/06/2012 0615   BASOSABS 0.0 06/06/2012 0615    Intake: 0% Output:  4 stools overnight, 1 stool today Emesis after eating breakfast this am   Diet Order: NPO  Supplements/Tube Feeding: none at this time  IVF:    Estimated Nutritional Needs:   Kcal: 1320-1430 Protein: 57-68g Fluid: >1.4 L/day  Pt states her usual wt is 126 lbs.  RD unable to get actual wt at time of visit.  Pt states she believes she may have lost wt due to nausea and vomiting PTA, but is unsure how much.  Pt and daughter state that she has been unable to eat for approximately 1 week.  Prior to onset of nausea/vomiting, pt was eating well per her usual and maintaining her usual wt of 126 lbs per her report. Pt currently NPO for ongoing dx.  RD to follow for diet advancement and tolerance.  NUTRITION DIAGNOSIS: -Inadequate oral intake (NI-2.1).  Status: Ongoing  RELATED TO: inability to eat  AS EVIDENCE BY: pt with N/V preventing intake  MONITORING/EVALUATION(Goals): 1.  Food/Beverage; diet advancement with tolerance.  EDUCATION NEEDS: -Education needs addressed  INTERVENTION: 1.  Modify diet; per MD discretion based on pt tolerance.  Pt and daughter state that pt has not been able to eat for 1 week PTA.  Recommend consideration of nutrition support if pt unable to advance diet with 48 hrs.  Dietitian #: [phone]  DOCUMENTATION CODES Per approved  criteria  -Not Applicable    Loyce Dys [facility] 06/08/2012, 1:06 PM

## 2012-06-08 NOTE — ED Provider Notes (Signed)
Medical screening examination/treatment/procedure(s) were conducted as a shared visit with non-physician practitioner(s) and myself.  I personally evaluated the patient during the encounter   Erica Sprout, MD 06/08/12 1601

## 2012-06-08 NOTE — Progress Notes (Signed)
Received pt back from endo. Recorded vital signs in flow sheets and WNL. Pt alert and oriented and has no complaints.

## 2012-06-08 NOTE — Progress Notes (Signed)
The patient tolerated her endoscopy very well. Please see the dictated procedure report for full list of findings and recommendations.  She does have distal erosive esophagitis, likely a consequence of her protracted vomiting. To help this to heal, I have increased the dose of her PPI, and have recommended that she take her meals sitting upright, and that the head of the bed be elevated about 30 at other times.  The cause of her nausea and vomiting is not evident from this exam. I assume it was probably infectious in origin.  Florencia Reasons, M.D. [phone]

## 2012-06-08 NOTE — Progress Notes (Signed)
Clinical Social Work Department BRIEF PSYCHOSOCIAL ASSESSMENT 06/08/2012  Patient:  Erica French, Erica French     Account Number:  [id number]     Admit date:  06/05/2012  Clinical Social Worker:  Dennison Bulla  Date/Time:  06/08/2012 12:30 PM  Referred by:  Physician  Date Referred:  06/08/2012 Referred for  SNF Placement   Other Referral:   Interview type:  Patient Other interview type:   Dtr present    PSYCHOSOCIAL DATA Living Status:  FACILITY Admitted from facility:  [facility] Level of care:  Skilled Nursing Facility Primary support name:  Nedra Hai Primary support relationship to patient:  CHILD, ADULT Degree of support available:   Strong    CURRENT CONCERNS Current Concerns  Post-Acute Placement   Other Concerns:    SOCIAL WORK ASSESSMENT / PLAN CSW received referral due to patient being admitted from a SNF. CSW reviewed chart and met with patient at bedside. Dtr present. Patient agreeable to dtr involvement.    CSW introduced myself and explained role. Patient has been at [facility] for the past 2-3 years. Patient desires to return at dc. Dtr reports that patient uses non-emergency ambulance to transport back to SNF. CSW called SNF who is agreeable to admission at dc. CSW completed FL2 and placed in chart for MD signature. CSW will continue to follow to assist with dc needs.   Assessment/plan status:  Psychosocial Support/Ongoing Assessment of Needs Other assessment/ plan:   Information/referral to community resources:   Patient will return to SNF    PATIENT'S/FAMILY'S RESPONSE TO PLAN OF CARE: Patient was alert and engaged throughout assessment. Patient and dtr agreeable to readmission to SNF at dc. Patient agreeable to all 4 dtrs being involved in her care.

## 2012-06-08 NOTE — Discharge Summary (Signed)
Physician Discharge Summary  COREA NEYRA WUJ:[id number] DOB: 09/19/1929 DOA: 06/05/2012  PCP: Sheila Oats, MD  Admit date: 06/05/2012 Discharge date: 06/08/2012  Recommendations for Outpatient Follow-up:  1. Pt will need to follow up with PCP in 2-3 weeks post discharge 2. Please obtain BMP to evaluate electrolytes and kidney function 3. Please also check CBC to evaluate Hg and Hct levels 4.   Discharge Diagnoses:  Active Problems:  FUO (fever of unknown origin)  Vomiting, persistent, in adult  Hx of arterial ischemic stroke  COPD (chronic obstructive pulmonary disease)  Abdominal  pain, other specified site Fever with intractable vomiting and abdominal pain  -tolerating liquids and cardiac diet -Possibly gastroenteritis with rapid improvement  -Patient has had a history of appendectomy and cholecystectomy  -No fevers in the past 72 hour  -UA is without pyuria  -Blood cultures negative at 72 hours  -d/C zosyn, stable off abx -Dilated biliary ducts on CT abdomen and pelvis without any other acute abnormalities, normal LFTs, no further workup at this time given clinical improvement -Lipase was unremarkable as were her liver enzymes   Hypokalemia  - repleted History of stroke  -Continue Plavix  Hypertension  -Continue carvedilol  COPD  -Continue Atrovent and Advair  History of temporal arteritis  -Switch back to prednisone given the patient's clinical improvement  Chest pain  -Occurs with swallowing sprite, also reproducible on palpation  -GI cocktail given with some relief -Serial troponins, EKG negativ   Discharge Condition: stable  Disposition: [facility]  Diet: Cardiac Wt Readings from Last 3 Encounters:  No data found for Wt    History of present illness:  78 year old female who presents to the emergency department from [facility] with fever and intractable vomiting. The patient and her daughters describe the vomiting as occasional spitting up as  well as intermittent retching. This has persisted since Monday prior to admission. The patient was in her usual state of health prior to this. She denies eating any raw or undercooked foods. There has been no sick contacts. There has been no changes in her medications. The patient states that she has not had fevers until today, but her daughters were skeptical of this fact it was reported from the nursing home. The patient denies any hematemesis, hematochezia, melena, dysuria, hematuria, rashes, neck pain or stiffness. She did develop a frontal headache this afternoon. However, her daughters state that the patient's mentation is good as usual. The patient began having abdominal pain this morning rating at 4/10. The patient denies any chest pain, shortness of breath, coughing, hemoptysis, synovitis, diarrhea. In fact, the patient is somewhat constipated. She has not had a bowel movement since Monday. The patient has baseline left-sided weakness from her stroke in 2009. This has not worsened.   Hospital Course:  *  Consultants: none  Discharge Exam: Filed Vitals:   06/08/12 0619  BP: 163/66  Pulse: 72  Temp: 98.4 F (36.9 C)  Resp: 17   Filed Vitals:   06/07/12 1340 06/07/12 1812 06/07/12 2102 06/08/12 0619  BP: 150/67 168/82 161/66 163/66  Pulse: 74 64 65 72  Temp: 98.8 F (37.1 C) 98.6 F (37 C) 98.7 F (37.1 C) 98.4 F (36.9 C)  TempSrc: Oral Oral Oral   Resp: 18 16 17 17   SpO2: 97% 98% 99% 95%   General: A&O x 3, NAD, pleasant, cooperative Cardiovascular: RRR, no rub, no gallop, no S3 Respiratory: CTAB, no wheeze, no rhonchi Abdomen:soft, nontender, nondistended, positive bowel sounds Ext: no rash,  no edema, no petechiae Discharge Instructions     Medication List     As of 06/08/2012  8:56 AM    ASK your doctor about these medications         alendronate 70 MG tablet   Commonly known as: FOSAMAX   Take 70 mg by mouth every 7 (seven) days. Take with a full glass of  water on an empty stomach.      calcium-vitamin D 500-200 MG-UNIT per tablet   Commonly known as: OSCAL WITH D   Take 1 tablet by mouth daily.      carvedilol 3.125 MG tablet   Commonly known as: COREG   Take 3.125 mg by mouth 2 (two) times daily with a meal.      citalopram 20 MG tablet   Commonly known as: CELEXA   Take 20 mg by mouth daily.      clopidogrel 75 MG tablet   Commonly known as: PLAVIX   Take 75 mg by mouth daily.      fluocinonide cream 0.05 %   Commonly known as: LIDEX   Apply 1 application topically 2 (two) times daily.      Fluticasone-Salmeterol 250-50 MCG/DOSE Aepb   Commonly known as: ADVAIR   Inhale 1 puff into the lungs every 12 (twelve) hours.      ipratropium 0.02 % nebulizer solution   Commonly known as: ATROVENT   Take 500 mcg by nebulization 4 (four) times daily.      loperamide 2 MG capsule   Commonly known as: IMODIUM   Take 2 mg by mouth 4 (four) times daily as needed.      magnesium oxide 400 MG tablet   Commonly known as: MAG-OX   Take 400 mg by mouth daily.      oxyCODONE 5 MG immediate release tablet   Commonly known as: Oxy IR/ROXICODONE   Take 5-10 mg by mouth every 4 (four) hours as needed. For pain      pantoprazole 40 MG tablet   Commonly known as: PROTONIX   Take 40 mg by mouth daily.      predniSONE 1 MG tablet   Commonly known as: DELTASONE   Take 3 mg by mouth daily.      saccharomyces boulardii 250 MG capsule   Commonly known as: FLORASTOR   Take 250 mg by mouth 2 (two) times daily.      sennosides-docusate sodium 8.6-50 MG tablet   Commonly known as: SENOKOT-S   Take 1 tablet by mouth daily.         The results of significant diagnostics from this hospitalization (including imaging, microbiology, ancillary and laboratory) are listed below for reference.    Significant Diagnostic Studies: Dg Abd 1 View  06/05/2012  *RADIOLOGY REPORT*  Clinical Data: Nausea, vomiting  ABDOMEN - 1 VIEW  Comparison:  03/15/2010  Findings: There is nonspecific nonobstructive bowel gas pattern. Mild levoscoliosis lumbar spine again noted.  Diffuse osteopenia. Mild degenerative changes bilateral hip joints.  Mild degenerative changes lumbar spine.  Prior vertebral plasty at L2 and L3 level. Post cholecystectomy surgical clips are noted.  IMPRESSION: Nonspecific nonobstructive bowel gas pattern. Diffuse osteopenia. Post cholecystectomy surgical clips.  Again noted mild lumbar levoscoliosis.  Mild degenerative changes lumbar spine and bilateral hip joints.   Original Report Authenticated By: Natasha Mead, M.D.    Ct Abdomen Pelvis W Contrast  06/06/2012  *RADIOLOGY REPORT*  Clinical Data: Fever, abdominal pain and vomiting  CT ABDOMEN AND PELVIS  WITH CONTRAST  Technique:  Multidetector CT imaging of the abdomen and pelvis was performed following the standard protocol during bolus administration of intravenous contrast.  Contrast: OMNIPAQUE IOHEXOL 300 MG/ML  SOLN  Comparison: 04/19/2011  Findings: There is a small left pleural effusion.  Lung bases are otherwise clear with changes of emphysema.  No focal liver abnormalities identified.  There is marked dilatation of the common bile duct which measures up to 1.6 cm, status post cholecystectomy.  Moderate intrahepatic bile duct dilatation is identified.  On 04/02/2010 the CBD measured 1.4 cm. No common bile duct stone or obstructing mass noted.  The pancreas appears within normal limits.  The spleen is unremarkable.  Bilateral adrenal glands appear normal.  Multiple bilateral renal cysts are identified. The kidneys are otherwise unremarkable.  No obstructive uropathy.  The urinary bladder appears within normal limits.  The uterus and the adnexal structures are unremarkable.  Borderline enlarged gastrohepatic ligament lymph node measures 1.1 cm, image 16. No pelvic or inguinal adenopathy identified.  No free fluid or fluid collections noted within the abdomen or pelvis.  Advanced  calcified atherosclerotic disease affects the abdominal aorta and its branches.  The stomach appears within normal limits.  The small bowel loops are normal no evidence for bowel obstruction.  Normal appearance of the colon.  There is a scoliosis deformity involving the lumbar spine which is convex to the right.  There are compression deformities involving the HU and L3 vertebra. These have been treated with bone cement.  No acute bony abnormalities noted.  IMPRESSION:  1.  Chronic marked dilatation of the common bile duct with moderate intrahepatic bile duct dilatation.  No obstructing stone or mass noted. 2.  Borderline enlarged gastrohepatic ligament lymph node. 3.  Small left pleural effusion.   Original Report Authenticated By: Rosealee Albee, M.D.    Dg Chest Portable 1 View  06/05/2012  *RADIOLOGY REPORT*  Clinical Data: Nausea, vomiting, shortness of breath, weakness, former smoker, COPD, hypertension  PORTABLE CHEST - 1 VIEW  Comparison: Portable exam 1436 hours compared to 05/01/2011  Findings: Upper normal heart size. Calcified mildly tortuous thoracic aorta. Mediastinal contours and pulmonary vascularity otherwise normal. Lungs appear emphysematous but clear. No pleural effusion or pneumothorax. Cardiac monitoring leads project over the lower chest. Bones diffusely demineralized.  IMPRESSION: COPD. No acute abnormalities.   Original Report Authenticated By: Lollie Marrow, M.D.      Microbiology: Recent Results (from the past 240 hour(s))  URINE CULTURE     Status: Normal   Collection Time   06/05/12  3:52 PM      Component Value Range Status Comment   Specimen Description URINE, CATHETERIZED   Final    Special Requests ADDED 06/05/12 2046   Final    Culture  Setup Time 06/05/2012 22:24   Final    Colony Count NO GROWTH   Final    Culture NO GROWTH   Final    Report Status 06/07/2012 FINAL   Final   CULTURE, BLOOD (ROUTINE X 2)     Status: Normal (Preliminary result)   Collection Time     06/05/12  6:45 PM      Component Value Range Status Comment   Specimen Description BLOOD HAND RIGHT   Final    Special Requests BOTTLES DRAWN AEROBIC AND ANAEROBIC 10CC   Final    Culture  Setup Time 06/06/2012 02:41   Final    Culture     Final  Value:        BLOOD CULTURE RECEIVED NO GROWTH TO DATE CULTURE WILL BE HELD FOR 5 DAYS BEFORE ISSUING A FINAL NEGATIVE REPORT   Report Status PENDING   Incomplete   CULTURE, BLOOD (ROUTINE X 2)     Status: Normal (Preliminary result)   Collection Time   06/05/12  6:55 PM      Component Value Range Status Comment   Specimen Description BLOOD ARM RIGHT   Final    Special Requests BOTTLES DRAWN AEROBIC AND ANAEROBIC 10CC   Final    Culture  Setup Time 06/06/2012 02:41   Final    Culture     Final    Value:        BLOOD CULTURE RECEIVED NO GROWTH TO DATE CULTURE WILL BE HELD FOR 5 DAYS BEFORE ISSUING A FINAL NEGATIVE REPORT   Report Status PENDING   Incomplete      Labs: Basic Metabolic Panel:  Lab 06/08/12 0981 06/07/12 0505 06/06/12 0615 06/05/12 1436  NA 142 141 144 143  K 2.7* 3.0* -- --  CL 104 106 113* 107  CO2 27 22 23 26   GLUCOSE 79 77 83 116*  BUN 10 15 11 16   CREATININE 0.79 0.78 0.62 0.74  CALCIUM 8.9 8.5 7.8* 8.8  MG -- -- 1.8 --  PHOS -- -- -- --   Liver Function Tests:  Lab 06/07/12 0505 06/05/12 1436  AST 18 17  ALT 8 8  ALKPHOS 46 51  BILITOT 0.4 0.5  PROT 5.7* 6.5  ALBUMIN 2.5* 2.9*    Lab 06/05/12 1436  LIPASE 15  AMYLASE --   No results found for this basename: AMMONIA:5 in the last 168 hours CBC:  Lab 06/08/12 0655 06/07/12 0505 06/06/12 0615 06/05/12 1436  WBC 7.5 7.3 9.0 10.1  NEUTROABS -- -- 7.9* 8.3*  HGB 12.1 10.2* 10.7* 11.8*  HCT 37.4 31.5* 33.2* 36.5  MCV 87.6 89.0 90.0 90.3  PLT 286 239 226 228   Cardiac Enzymes:  Lab 06/07/12 2227 06/07/12 1618  CKTOTAL -- --  CKMB -- --  CKMBINDEX -- --  TROPONINI <0.30 <0.30   BNP: No components found with this basename: POCBNP:5 CBG: No  results found for this basename: GLUCAP:5 in the last 168 hours  Time coordinating discharge:  Greater than 30 minutes  Signed:  Esequiel Kleinfelter, DO [facility] Pager: [phone] 06/08/2012, 8:56 AM

## 2012-06-09 ENCOUNTER — Encounter (HOSPITAL_COMMUNITY): Payer: Self-pay | Admitting: Gastroenterology

## 2012-06-09 ENCOUNTER — Encounter (HOSPITAL_COMMUNITY): Payer: Self-pay

## 2012-06-09 DIAGNOSIS — K209 Esophagitis, unspecified without bleeding: Secondary | ICD-10-CM

## 2012-06-09 LAB — COMPREHENSIVE METABOLIC PANEL
Albumin: 2.7 g/dL — ABNORMAL LOW (ref 3.5–5.2)
Alkaline Phosphatase: 47 U/L (ref 39–117)
BUN: 10 mg/dL (ref 6–23)
Calcium: 9.4 mg/dL (ref 8.4–10.5)
Creatinine, Ser: 0.73 mg/dL (ref 0.50–1.10)
GFR calc Af Amer: >90 mL/min (ref >90)
Glucose, Bld: 73 mg/dL (ref 70–99)
Potassium: 3 mEq/L — ABNORMAL LOW (ref 3.5–5.1)
Total Protein: 6 g/dL (ref 6.0–8.3)

## 2012-06-09 LAB — CBC
HCT: 36 % (ref 36.0–46.0)
Hemoglobin: 12.1 g/dL (ref 12.0–15.0)
MCV: 86.7 fL (ref 78.0–100.0)
RBC: 4.15 MIL/uL (ref 3.87–5.11)
WBC: 6.6 10*3/uL (ref 4.0–10.5)

## 2012-06-09 MED ORDER — POTASSIUM CHLORIDE 20 MEQ/15ML (10%) PO LIQD
40.0000 meq | Freq: Every day | ORAL | Status: DC
  Administered 2012-06-09 – 2012-06-11 (×2): 40 meq via ORAL
  Filled 2012-06-09 (×3): qty 30

## 2012-06-09 MED ORDER — ONDANSETRON HCL 4 MG/2ML IJ SOLN
4.0000 mg | Freq: Four times a day (QID) | INTRAMUSCULAR | Status: AC
  Administered 2012-06-09 – 2012-06-11 (×8): 4 mg via INTRAVENOUS
  Filled 2012-06-09 (×8): qty 2

## 2012-06-09 MED ORDER — POTASSIUM CHLORIDE 10 MEQ/100ML IV SOLN
10.0000 meq | INTRAVENOUS | Status: AC
  Administered 2012-06-09 (×2): 10 meq via INTRAVENOUS
  Filled 2012-06-09 (×2): qty 100

## 2012-06-09 NOTE — Consult Note (Signed)
Referring Provider: Dr. Onalee Hua Tat Primary Care Physician:  Gentry Fitz Primary Gastroenterologist:  None (has seen Dr. Evette Cristal previously, but is not regularly followed)  Reason for Consultation:  Dysphagia, odynophagia  HPI: Erica PANGALLO is a 78 y.o. female admitted to the hospital several days ago with intractable nausea and vomiting. CT was essentially negative except for a dilated bile duct remotely status post cholecystectomy, with normal liver chemistries so but is felt not to be clinically significant. As her vomiting has improved, she has developed into a problem with dysphagia and odynophagia. Endoscopy by Dr. Evette Cristal 2 years ago was unrevealing. The patient is maintained on PPI therapy as an outpatient. Of note, the patient is on Fosamax as an outpatient   Past Medical History  Diagnosis Date  . Stroke   . Pneumonia   . COPD (chronic obstructive pulmonary disease)   . Anxiety   . Hypertension   . Depression   . Thyroid nodule   . Headache     temple artiritis    Past Surgical History  Procedure Date  . Hernia repair   . Cholecystectomy   . Thyroid surgery   . Esophagogastroduodenoscopy 06/08/2012    Procedure: ESOPHAGOGASTRODUODENOSCOPY (EGD);  Surgeon: Florencia Reasons, MD;  Location: [facility] ENDOSCOPY;  Service: Endoscopy;  Laterality: N/A;    Prior to Admission medications   Medication Sig Start Date End Date Taking? Authorizing Provider  alendronate (FOSAMAX) 70 MG tablet Take 70 mg by mouth every 7 (seven) days. Take with a full glass of water on an empty stomach.   Yes Historical Provider, MD  calcium-vitamin D (OSCAL WITH D) 500-200 MG-UNIT per tablet Take 1 tablet by mouth daily.   Yes Historical Provider, MD  carvedilol (COREG) 3.125 MG tablet Take 3.125 mg by mouth 2 (two) times daily with a meal.   Yes Historical Provider, MD  citalopram (CELEXA) 20 MG tablet Take 20 mg by mouth daily.   Yes Historical Provider, MD  clopidogrel (PLAVIX) 75 MG tablet Take 75 mg by  mouth daily.   Yes Historical Provider, MD  fluocinonide cream (LIDEX) 0.05 % Apply 1 application topically 2 (two) times daily.   Yes Historical Provider, MD  Fluticasone-Salmeterol (ADVAIR) 250-50 MCG/DOSE AEPB Inhale 1 puff into the lungs every 12 (twelve) hours.   Yes Historical Provider, MD  ipratropium (ATROVENT) 0.02 % nebulizer solution Take 500 mcg by nebulization 4 (four) times daily.   Yes Historical Provider, MD  loperamide (IMODIUM) 2 MG capsule Take 2 mg by mouth 4 (four) times daily as needed.   Yes Historical Provider, MD  magnesium oxide (MAG-OX) 400 MG tablet Take 400 mg by mouth daily.   Yes Historical Provider, MD  oxyCODONE (OXY IR/ROXICODONE) 5 MG immediate release tablet Take 5-10 mg by mouth every 4 (four) hours as needed. For pain   Yes Historical Provider, MD  pantoprazole (PROTONIX) 40 MG tablet Take 40 mg by mouth daily.   Yes Historical Provider, MD  predniSONE (DELTASONE) 1 MG tablet Take 3 mg by mouth daily.   Yes Historical Provider, MD  saccharomyces boulardii (FLORASTOR) 250 MG capsule Take 250 mg by mouth 2 (two) times daily.   Yes Historical Provider, MD  sennosides-docusate sodium (SENOKOT-S) 8.6-50 MG tablet Take 1 tablet by mouth daily.   Yes Historical Provider, MD    Current Facility-Administered Medications  Medication Dose Route Frequency Provider Last Rate Last Dose  . acetaminophen (TYLENOL) tablet 650 mg  650 mg Oral Q6H PRN Catarina Hartshorn, MD  650 mg at 06/06/12 2243   Or  . acetaminophen (TYLENOL) suppository 650 mg  650 mg Rectal Q6H PRN Catarina Hartshorn, MD   650 mg at 06/06/12 0059  . carvedilol (COREG) tablet 3.125 mg  3.125 mg Oral BID WC Catarina Hartshorn, MD   3.125 mg at 06/08/12 1818  . citalopram (CELEXA) tablet 20 mg  20 mg Oral Daily Catarina Hartshorn, MD   20 mg at 06/08/12 0959  . clopidogrel (PLAVIX) tablet 75 mg  75 mg Oral Daily Catarina Hartshorn, MD   75 mg at 06/08/12 0959  . enoxaparin (LOVENOX) injection 40 mg  40 mg Subcutaneous Q24H Catarina Hartshorn, MD   40 mg at  06/08/12 2200  . Fluticasone-Salmeterol (ADVAIR) 250-50 MCG/DOSE inhaler 1 puff  1 puff Inhalation Q12H Catarina Hartshorn, MD   1 puff at 06/08/12 2009  . ipratropium (ATROVENT HFA) inhaler 2 puff  2 puff Inhalation BID Catarina Hartshorn, MD   2 puff at 06/08/12 2010  . magnesium oxide (MAG-OX) tablet 400 mg  400 mg Oral Daily Catarina Hartshorn, MD   400 mg at 06/08/12 0959  . ondansetron (ZOFRAN) injection 4 mg  4 mg Intravenous Q6H PRN Rolan Lipa, NP   4 mg at 06/07/12 1031  . pantoprazole (PROTONIX) injection 40 mg  40 mg Intravenous Q12H Florencia Reasons, MD   40 mg at 06/08/12 2310  . potassium chloride 10 mEq in 100 mL IVPB  10 mEq Intravenous Q1 Hr x 4 Catarina Hartshorn, MD   10 mEq at 06/08/12 1433  . potassium chloride 20 MEQ/15ML (10%) liquid 40 mEq  40 mEq Oral Once Catarina Hartshorn, MD   40 mEq at 06/08/12 0958  . predniSONE (DELTASONE) tablet 3 mg  3 mg Oral Q breakfast Catarina Hartshorn, MD   3 mg at 06/08/12 0846  . sennosides-docusate sodium (SENOKOT-S) 8.6-50 MG tablet 1 tablet  1 tablet Oral Daily Catarina Hartshorn, MD   1 tablet at 06/06/12 0951  . traZODone (DESYREL) tablet 50 mg  50 mg Oral QHS Catarina Hartshorn, MD   50 mg at 06/08/12 2310  . DISCONTD: butamben-tetracaine-benzocaine (CETACAINE) spray    PRN Florencia Reasons, MD   2 spray at 06/08/12 1715  . DISCONTD: fentaNYL (SUBLIMAZE) injection    PRN Florencia Reasons, MD   12.5 mcg at 06/08/12 1722  . DISCONTD: ipratropium (ATROVENT) nebulizer solution 500 mcg  500 mcg Nebulization QID Catarina Hartshorn, MD   500 mcg at 06/07/12 2135  . DISCONTD: midazolam (VERSED) injection    PRN Florencia Reasons, MD   1 mg at 06/08/12 1724  . DISCONTD: ondansetron (ZOFRAN) 8 mg/NS 50 ml IVPB  8 mg Intravenous Q6H Catarina Hartshorn, MD   8 mg at 06/08/12 0612  . DISCONTD: pantoprazole (PROTONIX) injection 40 mg  40 mg Intravenous Q24H Catarina Hartshorn, MD   40 mg at 06/08/12 1146  . DISCONTD: potassium chloride 20 MEQ/15ML (10%) liquid 80 mEq  80 mEq Oral Once Catarina Hartshorn, MD        Allergies as of 06/05/2012  - Review Complete 06/05/2012  Allergen Reaction Noted  . Codeine  06/05/2012  . Prozac (fluoxetine hcl)  06/05/2012    History reviewed. No pertinent family history.  History   Social History  . Marital Status: Widowed    Spouse Name: N/A    Number of Children: N/A  . Years of Education: N/A   Occupational History  . Not on file.   Social History Main  Topics  . Smoking status: Former Smoker    Quit date: 09/07/2008  . Smokeless tobacco: Not on file  . Alcohol Use: No  . Drug Use: No  . Sexually Active: Not Currently   Other Topics Concern  . Not on file   Social History Narrative  . No narrative on file     Physical Exam: Vital signs in last 24 hours: Temp:  [97.9 F (36.6 C)-99.1 F (37.3 C)] 97.9 F (36.6 C) (09/24 0518) Pulse Rate:  [71-94] 75  (09/24 0518) Resp:  [15-25] 18  (09/24 0518) BP: (135-181)/(66-92) 155/92 mmHg (09/24 0518) SpO2:  [91 %-100 %] 96 % (09/24 0518) Last BM Date: 06/08/12   This is a pleasant, somewhat frail appearing elderly female in no evident distress. Anicteric and without pallor. She appears to be cognitively intact. Chest clear, heart without murmurs or arrhythmias. Abdomen soft and nontender.  Intake/Output from previous day: 09/23 0701 - 09/24 0700 In: 120 [P.O.:120] Out: -  Intake/Output this shift:    Lab Results:  Basename 06/09/12 0535 06/08/12 0655 06/07/12 0505  WBC 6.6 7.5 7.3  HGB 12.1 12.1 10.2*  HCT 36.0 37.4 31.5*  PLT 279 286 239   BMET  Basename 06/09/12 0535 06/08/12 0655 06/07/12 0505  NA 142 142 141  K 3.0* 2.7* 3.0*  CL 104 104 106  CO2 26 27 22   GLUCOSE 73 79 77  BUN 10 10 15   CREATININE 0.73 0.79 0.78  CALCIUM 9.4 8.9 8.5   LFT  Basename 06/09/12 0535 06/08/12 1117  PROT 6.0 --  ALBUMIN 2.7* --  AST 17 --  ALT 9 --  ALKPHOS 47 --  BILITOT 0.3 --  BILIDIR -- <0.1  IBILI -- NOT CALCULATED   PT/INR No results found for this basename: LABPROT:2,INR:2 in the last 72  hours   Studies/Results: No results found.  Impression: The picture is suggestive of either an acute viral illness or perhaps a gastric outlet obstruction from ulcer disease. Given the abrupt onset, neoplasia seems unlikely  Plan: Endoscopic evaluation today to look for evidence of infection, gastric outlet obstruction, inflammation, etc. I have discussed the procedure with the patient and her family and they're agreeable to proceed.   LOS: 4 days   Diya Gervasi V  06/09/2012, 7:58 AM

## 2012-06-09 NOTE — Progress Notes (Signed)
Subjective: Still with some sialorrhea. Is able to tolerate some full liquids with less dysphagia/odynphagia. Nausea improving but still present.  Objective: Vital signs in last 24 hours: Temp:  [97.9 F (36.6 C)-99.1 F (37.3 C)] 97.9 F (36.6 C) (09/24 0518) Pulse Rate:  [71-94] 75  (09/24 0518) Resp:  [15-25] 18  (09/24 0518) BP: (135-181)/(66-92) 155/92 mmHg (09/24 0518) SpO2:  [91 %-100 %] 96 % (09/24 0518) Weight change:  Last BM Date: 06/08/12  PE: GEN:  NAD HEENT:  Poor dentition ABD:  Soft  Lab Results: CBC    Component Value Date/Time   WBC 6.6 06/09/2012 0535   RBC 4.15 06/09/2012 0535   HGB 12.1 06/09/2012 0535   HCT 36.0 06/09/2012 0535   PLT 279 06/09/2012 0535   MCV 86.7 06/09/2012 0535   MCH 29.2 06/09/2012 0535   MCHC 33.6 06/09/2012 0535   RDW 13.2 06/09/2012 0535   LYMPHSABS 0.8 06/06/2012 0615   MONOABS 0.2 06/06/2012 0615   EOSABS 0.0 06/06/2012 0615   BASOSABS 0.0 06/06/2012 0615   Assessment:  1.  Nausea and vomiting.  Vomiting resolved.  Nausea persists.  Likely infectious process. 2.  Dysphagia/odynophagia.  Moderately severe esophagitis seen on yesterday's endoscopy.  Improving slowly.  Plan:  1.  Full liquid diet only for now, until she is able to tolerate majority of her diet without significant dysphagia or nausea symptoms. 2.  IV PPI for the next 24 hours, or until she is able to readily tolerate diet, then transition to po bid.  Suspect protracted course of PPI BID (Protonix 40 bid) for the next 8-12 weeks. 3.  Scheduled antiemetics, in addition to prn dosing, for the next 48 hours. 4.  Keep head of bed elevated to 30-45 degrees, to decrease any interval worsening reflux. 5.  Would avoid Fosamax, high dose NSAIDs and other medications which can exacerbate esophagitis symptoms. 6.  Will sign-off; please call with questions; thank you for the consult.   Freddy Jaksch 06/09/2012, 8:54 AM

## 2012-06-09 NOTE — Progress Notes (Signed)
[facility] PROGRESS NOTE  JENTRY MCQUEARY ZHY:[id number] DOB: 12/26/1929 DOA: 06/05/2012 PCP: Sheila Oats, MD  Assessment/Plan:  Fever with intractable vomiting and abdominal pain  -Appreciate GI evaluation -tolerating liquids, but still has nausea--no vomiting -Possibly infectious gastroenteritis with rapid improvement and abrupt onset -Patient has had a history of appendectomy and cholecystectomy  -No fevers in the past 72 hour  -UA is without pyuria  -Blood cultures negative  -d/C zosyn, stable off abx  -Dilated biliary ducts on CT abdomen and pelvis without any other acute abnormalities, normal LFTs, no further workup at this time given clinical improvement  -Lipase was unremarkable as were her liver enzymes  Severe esophagitis -Appreciate GI evaluation -Continue IV PPI bid for the next 24 hours or until she is really able to tolerate diet -Plan to switch to oral PPI bid x 8-12 wks discharge- -Continue scheduled antiemetics Hypokalemia  - replete -Give daily potassium  -check magnesium level History of stroke  -Continue Plavix  Hypertension  -Continue carvedilol  -Fair control  COPD  -Continue Atrovent and Advair  History of temporal arteritis  -Switch back to prednisone given the patient's clinical improvement  Chest pain/odynophagia -Occurs with swallowing sprite, also reproducible on palpation  -GI cocktail given with some relief  -Serial troponins, EKG negative -Odynophagia seems to have improved today     Family Communication:  Daughter at beside Disposition Plan:   [facility] when able to tolerate solid diet   Procedures:  EGD September 23  Antibiotics:  Zosyn September 20>>> September 23  Vancomycin September 20>>> September 23    Procedures/Studies: Dg Abd 1 View  06/05/2012  *RADIOLOGY REPORT*  Clinical Data: Nausea, vomiting  ABDOMEN - 1 VIEW  Comparison: 03/15/2010  Findings: There is nonspecific nonobstructive bowel gas  pattern. Mild levoscoliosis lumbar spine again noted.  Diffuse osteopenia. Mild degenerative changes bilateral hip joints.  Mild degenerative changes lumbar spine.  Prior vertebral plasty at L2 and L3 level. Post cholecystectomy surgical clips are noted.  IMPRESSION: Nonspecific nonobstructive bowel gas pattern. Diffuse osteopenia. Post cholecystectomy surgical clips.  Again noted mild lumbar levoscoliosis.  Mild degenerative changes lumbar spine and bilateral hip joints.   Original Report Authenticated By: Natasha Mead, M.D.    Ct Abdomen Pelvis W Contrast  06/06/2012  *RADIOLOGY REPORT*  Clinical Data: Fever, abdominal pain and vomiting  CT ABDOMEN AND PELVIS WITH CONTRAST  Technique:  Multidetector CT imaging of the abdomen and pelvis was performed following the standard protocol during bolus administration of intravenous contrast.  Contrast: OMNIPAQUE IOHEXOL 300 MG/ML  SOLN  Comparison: 04/19/2011  Findings: There is a small left pleural effusion.  Lung bases are otherwise clear with changes of emphysema.  No focal liver abnormalities identified.  There is marked dilatation of the common bile duct which measures up to 1.6 cm, status post cholecystectomy.  Moderate intrahepatic bile duct dilatation is identified.  On 04/02/2010 the CBD measured 1.4 cm. No common bile duct stone or obstructing mass noted.  The pancreas appears within normal limits.  The spleen is unremarkable.  Bilateral adrenal glands appear normal.  Multiple bilateral renal cysts are identified. The kidneys are otherwise unremarkable.  No obstructive uropathy.  The urinary bladder appears within normal limits.  The uterus and the adnexal structures are unremarkable.  Borderline enlarged gastrohepatic ligament lymph node measures 1.1 cm, image 16. No pelvic or inguinal adenopathy identified.  No free fluid or fluid collections noted within the abdomen or pelvis.  Advanced calcified atherosclerotic disease affects the  abdominal aorta and  its branches.  The stomach appears within normal limits.  The small bowel loops are normal no evidence for bowel obstruction.  Normal appearance of the colon.  There is a scoliosis deformity involving the lumbar spine which is convex to the right.  There are compression deformities involving the HU and L3 vertebra. These have been treated with bone cement.  No acute bony abnormalities noted.  IMPRESSION:  1.  Chronic marked dilatation of the common bile duct with moderate intrahepatic bile duct dilatation.  No obstructing stone or mass noted. 2.  Borderline enlarged gastrohepatic ligament lymph node. 3.  Small left pleural effusion.   Original Report Authenticated By: Rosealee Albee, M.D.    Dg Chest Portable 1 View  06/05/2012  *RADIOLOGY REPORT*  Clinical Data: Nausea, vomiting, shortness of breath, weakness, former smoker, COPD, hypertension  PORTABLE CHEST - 1 VIEW  Comparison: Portable exam 1436 hours compared to 05/01/2011  Findings: Upper normal heart size. Calcified mildly tortuous thoracic aorta. Mediastinal contours and pulmonary vascularity otherwise normal. Lungs appear emphysematous but clear. No pleural effusion or pneumothorax. Cardiac monitoring leads project over the lower chest. Bones diffusely demineralized.  IMPRESSION: COPD. No acute abnormalities.   Original Report Authenticated By: Lollie Marrow, M.D.          Subjective: Patient is doing a little better today. She is tolerating full liquids without any odynophagia. He is still making her nauseous. Has not tried solid foods yet. Denies any chest pain, shortness of breath, vomiting, diarrhea, fevers, chills, dizziness.   Objective: Filed Vitals:   06/08/12 2010 06/08/12 2149 06/09/12 0518 06/09/12 1400  BP:  152/75 155/92 122/65  Pulse:  71 75 82  Temp:  98.3 F (36.8 C) 97.9 F (36.6 C) 98.1 F (36.7 C)  TempSrc:  Oral Oral Oral  Resp:  19 18 16   Height:      SpO2: 94% 94% 96% 96%    Intake/Output Summary (Last  24 hours) at 06/09/12 1422 Last data filed at 06/09/12 1300  Gross per 24 hour  Intake    600 ml  Output    401 ml  Net    199 ml   Weight change:  Exam:   General:  Pt is alert, follows commands appropriately, not in acute distress  HEENT: No icterus, No thrush, [place]/AT  Cardiovascular: RRR, S1/S2, no rubs, no gallops  Respiratory: Clear to auscultation bilaterally, no wheezing, no crackles, no rhonchi  Abdomen: Soft/+BS, non tender, non distended, no guarding  Extremities: No edema, No lymphangitis, No petechiae, No rashes, no synovitis  Data Reviewed: Basic Metabolic Panel:  Lab 06/09/12 1610 06/08/12 0655 06/07/12 0505 06/06/12 0615 06/05/12 1436  NA 142 142 141 144 143  K 3.0* 2.7* 3.0* 4.6 3.2*  CL 104 104 106 113* 107  CO2 26 27 22 23 26   GLUCOSE 73 79 77 83 116*  BUN 10 10 15 11 16   CREATININE 0.73 0.79 0.78 0.62 0.74  CALCIUM 9.4 8.9 8.5 7.8* 8.8  MG -- -- -- 1.8 --  PHOS -- -- -- -- --   Liver Function Tests:  Lab 06/09/12 0535 06/08/12 1117 06/07/12 0505 06/05/12 1436  AST 17 23 18 17   ALT 9 8 8 8   ALKPHOS 47 52 46 51  BILITOT 0.3 0.3 0.4 0.5  PROT 6.0 6.7 5.7* 6.5  ALBUMIN 2.7* 3.0* 2.5* 2.9*    Lab 06/05/12 1436  LIPASE 15  AMYLASE --   No results found  for this basename: AMMONIA:5 in the last 168 hours CBC:  Lab 06/09/12 0535 06/08/12 0655 06/07/12 0505 06/06/12 0615 06/05/12 1436  WBC 6.6 7.5 7.3 9.0 10.1  NEUTROABS -- -- -- 7.9* 8.3*  HGB 12.1 12.1 10.2* 10.7* 11.8*  HCT 36.0 37.4 31.5* 33.2* 36.5  MCV 86.7 87.6 89.0 90.0 90.3  PLT 279 286 239 226 228   Cardiac Enzymes:  Lab 06/07/12 2227 06/07/12 1618  CKTOTAL -- --  CKMB -- --  CKMBINDEX -- --  TROPONINI <0.30 <0.30   BNP: No components found with this basename: POCBNP:5 CBG: No results found for this basename: GLUCAP:5 in the last 168 hours  Recent Results (from the past 240 hour(s))  URINE CULTURE     Status: Normal   Collection Time   06/05/12  3:52 PM      Component  Value Range Status Comment   Specimen Description URINE, CATHETERIZED   Final    Special Requests ADDED 06/05/12 2046   Final    Culture  Setup Time 06/05/2012 22:24   Final    Colony Count NO GROWTH   Final    Culture NO GROWTH   Final    Report Status 06/07/2012 FINAL   Final   CULTURE, BLOOD (ROUTINE X 2)     Status: Normal (Preliminary result)   Collection Time   06/05/12  6:45 PM      Component Value Range Status Comment   Specimen Description BLOOD HAND RIGHT   Final    Special Requests BOTTLES DRAWN AEROBIC AND ANAEROBIC 10CC   Final    Culture  Setup Time 06/06/2012 02:41   Final    Culture     Final    Value:        BLOOD CULTURE RECEIVED NO GROWTH TO DATE CULTURE WILL BE HELD FOR 5 DAYS BEFORE ISSUING A FINAL NEGATIVE REPORT   Report Status PENDING   Incomplete   CULTURE, BLOOD (ROUTINE X 2)     Status: Normal (Preliminary result)   Collection Time   06/05/12  6:55 PM      Component Value Range Status Comment   Specimen Description BLOOD ARM RIGHT   Final    Special Requests BOTTLES DRAWN AEROBIC AND ANAEROBIC 10CC   Final    Culture  Setup Time 06/06/2012 02:41   Final    Culture     Final    Value:        BLOOD CULTURE RECEIVED NO GROWTH TO DATE CULTURE WILL BE HELD FOR 5 DAYS BEFORE ISSUING A FINAL NEGATIVE REPORT   Report Status PENDING   Incomplete      Scheduled Meds:   . carvedilol  3.125 mg Oral BID WC  . citalopram  20 mg Oral Daily  . clopidogrel  75 mg Oral Daily  . enoxaparin (LOVENOX) injection  40 mg Subcutaneous Q24H  . Fluticasone-Salmeterol  1 puff Inhalation Q12H  . ipratropium  2 puff Inhalation BID  . magnesium oxide  400 mg Oral Daily  . ondansetron  4 mg Intravenous Q6H  . pantoprazole (PROTONIX) IV  40 mg Intravenous Q12H  . potassium chloride  10 mEq Intravenous Q1 Hr x 4  . predniSONE  3 mg Oral Q breakfast  . sennosides-docusate sodium  1 tablet Oral Daily  . traZODone  50 mg Oral QHS  . DISCONTD: pantoprazole (PROTONIX) IV  40 mg  Intravenous Q24H     Mysha Peeler, DO  [facility] Pager [phone]  If  7PM-7AM, please contact night-coverage [email or web address] Password [email or web address] 06/09/2012, 2:22 PM   LOS: 4 days

## 2012-06-09 NOTE — Progress Notes (Signed)
Clinical Social Work  Per MD note, patient is not ready to dc. CSW called facility and updated facility on dc plans. CSW will continue to follow.  [place], [place] [phone]

## 2012-06-10 DIAGNOSIS — R109 Unspecified abdominal pain: Secondary | ICD-10-CM

## 2012-06-10 DIAGNOSIS — R509 Fever, unspecified: Secondary | ICD-10-CM

## 2012-06-10 DIAGNOSIS — J449 Chronic obstructive pulmonary disease, unspecified: Secondary | ICD-10-CM

## 2012-06-10 DIAGNOSIS — K209 Esophagitis, unspecified without bleeding: Secondary | ICD-10-CM

## 2012-06-10 LAB — BASIC METABOLIC PANEL
BUN: 10 mg/dL (ref 6–23)
CO2: 29 mEq/L (ref 19–32)
Chloride: 103 mEq/L (ref 96–112)
Creatinine, Ser: 0.86 mg/dL (ref 0.50–1.10)
GFR calc Af Amer: 72 mL/min — ABNORMAL LOW (ref >90)
Glucose, Bld: 88 mg/dL (ref 70–99)
Potassium: 3.7 mEq/L (ref 3.5–5.1)

## 2012-06-10 NOTE — Progress Notes (Signed)
[facility]                                                                                Patient Demographics  Erica French, is a 78 y.o. female  ZOX:[id number]  WJX:[id number]  DOB - 11-19-1929  Admit date - 06/05/2012  Admitting Physician Catarina Hartshorn, MD  Outpatient Primary MD for the patient is DEFAULT,PROVIDER, MD  LOS - 5   Chief Complaint  Patient presents with  . Nausea  . Emesis        Assessment & Plan    1. Fever with intractable vomiting and abdominal pain  -Appreciate GI evaluation  -tolerating liquids, but still has nausea--no vomiting  -Possibly infectious gastroenteritis with rapid improvement and abrupt onset  -Patient has had a history of appendectomy and cholecystectomy  -No fevers in the past 72 hour  -UA is without pyuria  -Blood cultures negative  -d/C zosyn, stable off abx  -Dilated biliary ducts on CT abdomen and pelvis without any other acute abnormalities, normal LFTs, no further workup at this time given clinical improvement  -Lipase was unremarkable as were her liver enzymes  Advance diet as tolerated     2. Severe esophagitis noted on EGD along with than aphagia and some chest pain with swallowing -Appreciate GI evaluation  -Continue IV PPI bid for the next 24 hours or until she is really able to tolerate diet  -Plan to switch to oral PPI bid x 8-12 wks discharge-  -Continue scheduled antiemetics , is showing improvement will advance diet as tolerated.     3.Hypokalemia  - replete  Stable potassium and magnesium     4.History of stroke  -Continue Plavix     5.Hypertension  -Continue carvedilol  -Fair control     6.COPD  -Continue Atrovent and Advair    7.History of temporal arteritis  -Switched back to prednisone given the patient's clinical improvement     8.Chest pain/odynophagia from #2 above -Occurs with swallowing sprite, also reproducible on palpation  -GI cocktail given with some  relief  -Serial troponins, EKG negative  -Odynophagia seems to have improved today        Family Communication: His cast with the patient  Disposition Plan: TBD    Procedures EGD   Consults  GI   Time Spent in minutes   35   Antibiotics     Anti-infectives     Start     Dose/Rate Route Frequency Ordered Stop   06/05/12 2359   piperacillin-tazobactam (ZOSYN) IVPB 3.375 g  Status:  Discontinued        3.375 g 12.5 mL/hr over 240 Minutes Intravenous Every 8 hours 06/05/12 2338 06/07/12 1556   06/05/12 2200   piperacillin-tazobactam (ZOSYN) IVPB 3.375 g  Status:  Discontinued     Comments: Start after blood cultures      3.375 g 100 mL/hr over 30 Minutes Intravenous 3 times per day 06/05/12 2159 06/05/12 2338          Scheduled Meds:   . carvedilol  3.125 mg Oral BID WC  . citalopram  20 mg Oral Daily  . clopidogrel  75 mg Oral Daily  . enoxaparin (  LOVENOX) injection  40 mg Subcutaneous Q24H  . Fluticasone-Salmeterol  1 puff Inhalation Q12H  . magnesium oxide  400 mg Oral Daily  . ondansetron  4 mg Intravenous Q6H  . pantoprazole (PROTONIX) IV  40 mg Intravenous Q12H  . potassium chloride  10 mEq Intravenous Q1 Hr x 2  . potassium chloride  40 mEq Oral Daily  . predniSONE  3 mg Oral Q breakfast  . sennosides-docusate sodium  1 tablet Oral Daily  . traZODone  50 mg Oral QHS  . DISCONTD: ipratropium  2 puff Inhalation BID   Continuous Infusions:  PRN Meds:.acetaminophen, acetaminophen   DVT Prophylaxis  Lovenox      Susa Raring K M.D on 06/10/2012 at 12:17 PM  Between 7am to 7pm - Pager - [phone]  After 7pm go to [email or web address] - password [email or web address]  And look for the night coverage person covering for me after hours  [facility] Office  [phone]    Subjective:   Erica French today has, No headache, No chest pain, No abdominal pain - No Nausea, No new weakness tingling or numbness, No Cough - SOB.    Objective:   Filed  Vitals:   06/09/12 2159 06/10/12 0100 06/10/12 0550 06/10/12 0858  BP: 147/67  151/67   Pulse: 85  84   Temp: 98 F (36.7 C)  98.1 F (36.7 C)   TempSrc: Oral  Oral   Resp: 16  18   Height:  5\' 7"  (1.702 m)    Weight:  72.258 kg (159 lb 4.8 oz)    SpO2: 94%  97% 97%    Wt Readings from Last 3 Encounters:  06/10/12 72.258 kg (159 lb 4.8 oz)  06/10/12 72.258 kg (159 lb 4.8 oz)     Intake/Output Summary (Last 24 hours) at 06/10/12 1217 Last data filed at 06/10/12 0837  Gross per 24 hour  Intake    300 ml  Output      0 ml  Net    300 ml    Exam Awake Alert, Oriented X 3, No new F.N deficits, Normal affect .AT,PERRAL Supple Neck,No JVD, No cervical lymphadenopathy appriciated.  Symmetrical Chest wall movement, Good air movement bilaterally, CTAB RRR,No Gallops,Rubs or new Murmurs, No Parasternal Heave +ve B.Sounds, Abd Soft, Non tender, No organomegaly appriciated, No rebound - guarding or rigidity. No Cyanosis, Clubbing or edema, No new Rash or bruise    Data Review   Micro Results Recent Results (from the past 240 hour(s))  URINE CULTURE     Status: Normal   Collection Time   06/05/12  3:52 PM      Component Value Range Status Comment   Specimen Description URINE, CATHETERIZED   Final    Special Requests ADDED 06/05/12 2046   Final    Culture  Setup Time 06/05/2012 22:24   Final    Colony Count NO GROWTH   Final    Culture NO GROWTH   Final    Report Status 06/07/2012 FINAL   Final   CULTURE, BLOOD (ROUTINE X 2)     Status: Normal (Preliminary result)   Collection Time   06/05/12  6:45 PM      Component Value Range Status Comment   Specimen Description BLOOD HAND RIGHT   Final    Special Requests BOTTLES DRAWN AEROBIC AND ANAEROBIC 10CC   Final    Culture  Setup Time 06/06/2012 02:41   Final    Culture     Final  Value:        BLOOD CULTURE RECEIVED NO GROWTH TO DATE CULTURE WILL BE HELD FOR 5 DAYS BEFORE ISSUING A FINAL NEGATIVE REPORT   Report Status  PENDING   Incomplete   CULTURE, BLOOD (ROUTINE X 2)     Status: Normal (Preliminary result)   Collection Time   06/05/12  6:55 PM      Component Value Range Status Comment   Specimen Description BLOOD ARM RIGHT   Final    Special Requests BOTTLES DRAWN AEROBIC AND ANAEROBIC 10CC   Final    Culture  Setup Time 06/06/2012 02:41   Final    Culture     Final    Value:        BLOOD CULTURE RECEIVED NO GROWTH TO DATE CULTURE WILL BE HELD FOR 5 DAYS BEFORE ISSUING A FINAL NEGATIVE REPORT   Report Status PENDING   Incomplete     Radiology Reports Dg Abd 1 View  06/05/2012  *RADIOLOGY REPORT*  Clinical Data: Nausea, vomiting  ABDOMEN - 1 VIEW  Comparison: 03/15/2010  Findings: There is nonspecific nonobstructive bowel gas pattern. Mild levoscoliosis lumbar spine again noted.  Diffuse osteopenia. Mild degenerative changes bilateral hip joints.  Mild degenerative changes lumbar spine.  Prior vertebral plasty at L2 and L3 level. Post cholecystectomy surgical clips are noted.  IMPRESSION: Nonspecific nonobstructive bowel gas pattern. Diffuse osteopenia. Post cholecystectomy surgical clips.  Again noted mild lumbar levoscoliosis.  Mild degenerative changes lumbar spine and bilateral hip joints.   Original Report Authenticated By: Natasha Mead, M.D.    Ct Abdomen Pelvis W Contrast  06/06/2012  *RADIOLOGY REPORT*  Clinical Data: Fever, abdominal pain and vomiting  CT ABDOMEN AND PELVIS WITH CONTRAST  Technique:  Multidetector CT imaging of the abdomen and pelvis was performed following the standard protocol during bolus administration of intravenous contrast.  Contrast: OMNIPAQUE IOHEXOL 300 MG/ML  SOLN  Comparison: 04/19/2011  Findings: There is a small left pleural effusion.  Lung bases are otherwise clear with changes of emphysema.  No focal liver abnormalities identified.  There is marked dilatation of the common bile duct which measures up to 1.6 cm, status post cholecystectomy.  Moderate intrahepatic bile  duct dilatation is identified.  On 04/02/2010 the CBD measured 1.4 cm. No common bile duct stone or obstructing mass noted.  The pancreas appears within normal limits.  The spleen is unremarkable.  Bilateral adrenal glands appear normal.  Multiple bilateral renal cysts are identified. The kidneys are otherwise unremarkable.  No obstructive uropathy.  The urinary bladder appears within normal limits.  The uterus and the adnexal structures are unremarkable.  Borderline enlarged gastrohepatic ligament lymph node measures 1.1 cm, image 16. No pelvic or inguinal adenopathy identified.  No free fluid or fluid collections noted within the abdomen or pelvis.  Advanced calcified atherosclerotic disease affects the abdominal aorta and its branches.  The stomach appears within normal limits.  The small bowel loops are normal no evidence for bowel obstruction.  Normal appearance of the colon.  There is a scoliosis deformity involving the lumbar spine which is convex to the right.  There are compression deformities involving the HU and L3 vertebra. These have been treated with bone cement.  No acute bony abnormalities noted.  IMPRESSION:  1.  Chronic marked dilatation of the common bile duct with moderate intrahepatic bile duct dilatation.  No obstructing stone or mass noted. 2.  Borderline enlarged gastrohepatic ligament lymph node. 3.  Small left pleural effusion.  Original Report Authenticated By: Rosealee Albee, M.D.    Dg Chest Portable 1 View  06/05/2012  *RADIOLOGY REPORT*  Clinical Data: Nausea, vomiting, shortness of breath, weakness, former smoker, COPD, hypertension  PORTABLE CHEST - 1 VIEW  Comparison: Portable exam 1436 hours compared to 05/01/2011  Findings: Upper normal heart size. Calcified mildly tortuous thoracic aorta. Mediastinal contours and pulmonary vascularity otherwise normal. Lungs appear emphysematous but clear. No pleural effusion or pneumothorax. Cardiac monitoring leads project over the lower  chest. Bones diffusely demineralized.  IMPRESSION: COPD. No acute abnormalities.   Original Report Authenticated By: Lollie Marrow, M.D.     CBC  Lab 06/09/12 0535 06/08/12 0655 06/07/12 0505 06/06/12 0615 06/05/12 1436  WBC 6.6 7.5 7.3 9.0 10.1  HGB 12.1 12.1 10.2* 10.7* 11.8*  HCT 36.0 37.4 31.5* 33.2* 36.5  PLT 279 286 239 226 228  MCV 86.7 87.6 89.0 90.0 90.3  MCH 29.2 28.3 28.8 29.0 29.2  MCHC 33.6 32.4 32.4 32.2 32.3  RDW 13.2 13.4 13.6 13.6 13.6  LYMPHSABS -- -- -- 0.8 0.5*  MONOABS -- -- -- 0.2 1.2*  EOSABS -- -- -- 0.0 0.0  BASOSABS -- -- -- 0.0 0.0  BANDABS -- -- -- -- --    Chemistries   Lab 06/10/12 0530 06/09/12 0535 06/08/12 1117 06/08/12 0655 06/07/12 0505 06/06/12 0615 06/05/12 1436  NA 141 142 -- 142 141 144 --  K 3.7 3.0* -- 2.7* 3.0* 4.6 --  CL 103 104 -- 104 106 113* --  CO2 29 26 -- 27 22 23  --  GLUCOSE 88 73 -- 79 77 83 --  BUN 10 10 -- 10 15 11  --  CREATININE 0.86 0.73 -- 0.79 0.78 0.62 --  CALCIUM 9.7 9.4 -- 8.9 8.5 7.8* --  MG 1.9 -- -- -- -- 1.8 --  AST -- 17 23 -- 18 -- 17  ALT -- 9 8 -- 8 -- 8  ALKPHOS -- 47 52 -- 46 -- 51  BILITOT -- 0.3 0.3 -- 0.4 -- 0.5   ------------------------------------------------------------------------------------------------------------------ estimated creatinine clearance is 50.7 ml/min (by C-G formula based on Cr of 0.86). ------------------------------------------------------------------------------------------------------------------ No results found for this basename: HGBA1C:2 in the last 72 hours ------------------------------------------------------------------------------------------------------------------ No results found for this basename: CHOL:2,HDL:2,LDLCALC:2,TRIG:2,CHOLHDL:2,LDLDIRECT:2 in the last 72 hours ------------------------------------------------------------------------------------------------------------------ No results found for this basename: TSH,T4TOTAL,FREET3,T3FREE,THYROIDAB in the  last 72 hours ------------------------------------------------------------------------------------------------------------------ No results found for this basename: VITAMINB12:2,FOLATE:2,FERRITIN:2,TIBC:2,IRON:2,RETICCTPCT:2 in the last 72 hours  Coagulation profile No results found for this basename: INR:5,PROTIME:5 in the last 168 hours  No results found for this basename: DDIMER:2 in the last 72 hours  Cardiac Enzymes  Lab 06/07/12 2227 06/07/12 1618  CKMB -- --  TROPONINI <0.30 <0.30  MYOGLOBIN -- --   ------------------------------------------------------------------------------------------------------------------ No components found with this basename: POCBNP:3

## 2012-06-10 NOTE — Progress Notes (Signed)
Clinical Social Work  CSW reviewed chart which stated patient is not ready to dc. CSW called SNF and updated facility on dc plans. SNF agreeable to admission at dc and would appreciate daily updates in regards to dc plans. CSW will continue to follow.  Yaritzel Stange, LCSW [phone] 

## 2012-06-10 NOTE — Evaluation (Signed)
Physical Therapy Evaluation Patient Details Name: Erica French MRN: [id number] DOB: 09-19-1929 Today's Date: 06/10/2012 Time: 4098-1191 PT Time Calculation (min): 10 min  PT Assessment / Plan / Recommendation Clinical Impression  78 y.o. female admitted to [facility] for irretractable nausea and vomitting s/p endoscopy with no anatomical results causing the N/V.  Dx with possible infectious gastroenteritis.  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  She reports she has not walked since her stroke, yet she has 3-/5 strength at the knee and hip and has some PF/DF and shoulder blade mobility.  I think she could be able to walk with the right equipment (L PFRW) and two person assist.  PTA she was assisted to a WC by SNF staff and then was able to roll the WC around the unit on her own.      PT Assessment  Patient needs continued PT services    Follow Up Recommendations  Skilled nursing facility;Other (comment) (with PT f/u at SNF)    Barriers to Discharge None      Equipment Recommendations   (L platform RW)    Recommendations for Other Services   NA  Frequency Min 2X/week    Precautions / Restrictions Precautions Precautions: Fall Precaution Comments: L hemiperesis   Pertinent Vitals/Pain No reports of pain      Mobility  Bed Mobility Bed Mobility: Supine to Sit;Sitting - Scoot to [place] of Bed;Sit to Supine;Scooting to [facility] Supine to Sit: 4: Min assist;HOB elevated;With rails Sitting - Scoot to Edge of Bed: 4: Min assist;With rail Sit to Supine: 4: Min assist;HOB flat;With rail Scooting to [facility]: 4: Min assist;With rail (and bed in trendelenberg) Details for Bed Mobility Assistance: relies heavily on bedrail and needs minimal assist to weight shift hips to scoot and to support trunk for balance during supine to sit transitions, support feet into bed during sit to supine transition.    Transfers Transfers: Sit to Stand;Stand to Sit;Stand Pivot Transfers Sit to Stand: 3: Mod assist;With upper extremity assist;From chair/3-in-1;From bed Stand to Sit: 3: Mod assist;With upper extremity assist;To bed Stand Pivot Transfers: 3: Mod assist;With armrests Details for Transfer Assistance: mod assist to transfer to and from the chair going both towards and away from the pt's weaker side.   Ambulation/Gait Ambulation/Gait Assistance: Not tested (comment) (I beleive she could take some steps with the right equipment)           PT Diagnosis: Difficulty walking;Abnormality of gait;Generalized weakness  PT Problem List: Decreased strength;Decreased activity tolerance;Decreased balance;Decreased mobility;Decreased knowledge of use of DME PT Treatment Interventions: DME instruction;Gait training;Functional mobility training;Therapeutic activities;Therapeutic exercise;Balance training;Neuromuscular re-education;Patient/family education;Wheelchair mobility training   PT Goals Acute Rehab PT Goals PT Goal Formulation: With patient Time For Goal Achievement: 06/24/12 Potential to Achieve Goals: Good Pt will go Supine/Side to Sit: with modified independence PT Goal: Supine/Side to Sit - Progress: Goal set today Pt will go Sit to Supine/Side: with modified independence;with HOB 0 degrees;with rail PT Goal: Sit to Supine/Side - Progress: Goal set today Pt will go Sit to Stand: with min assist;with upper extremity assist PT Goal: Sit to Stand - Progress: Goal set today Pt will go Stand to Sit: with min assist;with upper extremity assist PT Goal: Stand to Sit - Progress: Goal set today Pt will Transfer Bed to Chair/Chair to Bed: with min assist PT Transfer Goal: Bed to Chair/Chair to Bed - Progress: Goal  set today Pt will Ambulate: 1 - 15 feet;with +2 total assist;with rolling walker (L PFRW) PT Goal: Ambulate - Progress: Goal set today Pt will Propel Wheelchair: 51 - 150 feet;with  supervision PT Goal: Propel Wheelchair - Progress: Goal set today  Visit Information  Last PT Received On: 06/10/12 Assistance Needed: +1 (for OOB +2 to attempt gait)    Subjective Data  Subjective: Pt reports her stroke was ~3 years ago and she has not walked since then. She gets assistance to get into the [facility] at the SNF and then uses her feet to get around in the [facility].   Patient Stated Goal: to get stronger   Prior Functioning  Home Living Type of Home: Skilled Nursing Facility [facility]) Additional Comments: assist to get to [facility] from SNF staff, then independent with WC mobility using her feet and right arm.   Prior Function Level of Independence: Needs assistance Driving: No       Extremity/Trunk Assessment Right Lower Extremity Assessment RLE ROM/Strength/Tone: Deficits RLE ROM/Strength/Tone Deficits: 2/5 ankle DF/PF, 3-/5 knee and hip Left Lower Extremity Assessment LLE ROM/Strength/Tone: Deficits LLE ROM/Strength/Tone Deficits: grossly 4/5      End of Session PT - End of Session Equipment Utilized During Treatment: Gait belt Activity Tolerance: Patient limited by fatigue Patient left: in bed;with call bell/phone within reach  GP     Quame Spratlin B. Brentt Fread, PT, DPT [phone]   06/10/2012, 3:47 PM

## 2012-06-11 MED ORDER — ONDANSETRON HCL 4 MG PO TABS: 4.0000 mg | ORAL_TABLET | Freq: Three times a day (TID) | ORAL | Status: DC | PRN

## 2012-06-11 MED ORDER — PANTOPRAZOLE SODIUM 40 MG PO TBEC: 40.0000 mg | DELAYED_RELEASE_TABLET | Freq: Two times a day (BID) | ORAL | Status: DC

## 2012-06-11 MED ORDER — OXYCODONE HCL 5 MG PO TABS: 5.0000 mg | ORAL_TABLET | ORAL | Status: DC | PRN

## 2012-06-11 NOTE — Progress Notes (Signed)
Clinical Social Work  CSW faxed [facility] and medications to [facility]. SNF agreeable to admission. CSW prepared dc packet with prescriptions, FL2 and chart copy. CSW informed patient, dtr and RN of dc and all were agreeable. CSW coordinated transportation via [place]. CSW is signing off.  [place], [place] [phone]

## 2012-06-11 NOTE — Progress Notes (Signed)
DC VIA AMBULANCE TO [facility]. DAUGHTER AT BEDSIDE. NO VERBAL QUESTIONS OR CONCERNS

## 2012-06-11 NOTE — Discharge Summary (Signed)
[facility]                                                                                   Erica French, is a 78 y.o. female  DOB [birth year]  MRN [id number].  Admission date:  06/05/2012  Discharge Date:  06/11/2012  Primary MD  DEFAULT,PROVIDER, MD  Admitting Physician  Catarina Hartshorn, MD  Admission Diagnosis  Abdominal  pain, other specified site [789.09] COPD (chronic obstructive pulmonary disease) [496] FUO (fever of unknown origin) [780.60] Vomiting, persistent, in adult [536.2] abd pain, n/v nausea/vomiting  Discharge Diagnosis     Active Problems:  FUO (fever of unknown origin)  Vomiting, persistent, in adult  Hx of arterial ischemic stroke  COPD (chronic obstructive pulmonary disease)  Abdominal  pain, other specified site  Esophagitis    Past Medical History  Diagnosis Date  . Stroke   . Pneumonia   . COPD (chronic obstructive pulmonary disease)   . Anxiety   . Hypertension   . Depression   . Thyroid nodule   . Headache     temple artiritis    Past Surgical History  Procedure Date  . Hernia repair   . Cholecystectomy   . Thyroid surgery   . Esophagogastroduodenoscopy 06/08/2012    Procedure: ESOPHAGOGASTRODUODENOSCOPY (EGD);  Surgeon: Florencia Reasons, MD;  Location: [facility] ENDOSCOPY;  Service: Endoscopy;  Laterality: N/A;     Recommendations for primary care physician for things to follow:   Outpt GI follow, resume Fosamax after cleared by GI   Discharge Diagnoses: Gastroenteritis and Esophagitis   Discharge Condition: stable   Diet recommendation: See Discharge Instructions below   Consults GI   History of present illness and  Hospital Course:  See H&P, Labs, Consult and Test reports for all details in brief, patient was admitted for Viral gastroenteritis which has resolved along with Odanophagia/Non cardiac Chest pain (with negative EKG and troponins) and Nausea from esophagitis (on fosamax), was seen by GI had EGD  showing esophagitis, better on PPI and symptom free, tolerating PO diet, will need outpt GI follow up, resume Fosamax after cleared by GI to do so, avoid NSAIDs.  H/O CVA and HTN - continue home Meds.    History of temporal arteritis - Switched back to prednisone outpt follow up with PCP.    Today   Subjective:   Erica French today has no headache,no chest abdominal pain,no new weakness tingling or numbness, feels much better.  Objective:   Blood pressure 133/67, pulse 84, temperature 97.9 F (36.6 C), temperature source Oral, resp. rate 18, height 5\' 7"  (1.702 m), weight 72.258 kg (159 lb 4.8 oz), SpO2 97.00%.  No intake or output data in the 24 hours ending 06/11/12 1024  Exam Awake Alert, Oriented *3, No new F.N deficits, Normal affect [place].AT,PERRAL Supple Neck,No JVD, No cervical lymphadenopathy appriciated.  Symmetrical Chest wall movement, Good air movement bilaterally, CTAB RRR,No Gallops,Rubs or new Murmurs, No Parasternal Heave +ve B.Sounds, Abd Soft, Non tender, No organomegaly appriciated, No rebound -guarding or rigidity. No Cyanosis, Clubbing or edema, No new Rash or bruise  Data Review   Major procedures and Radiology Reports - PLEASE  review detailed and final reports for all details in brief -     Dg Abd 1 View  06/05/2012  *RADIOLOGY REPORT*  Clinical Data: Nausea, vomiting  ABDOMEN - 1 VIEW  Comparison: 03/15/2010  Findings: There is nonspecific nonobstructive bowel gas pattern. Mild levoscoliosis lumbar spine again noted.  Diffuse osteopenia. Mild degenerative changes bilateral hip joints.  Mild degenerative changes lumbar spine.  Prior vertebral plasty at L2 and L3 level. Post cholecystectomy surgical clips are noted.  IMPRESSION: Nonspecific nonobstructive bowel gas pattern. Diffuse osteopenia. Post cholecystectomy surgical clips.  Again noted mild lumbar levoscoliosis.  Mild degenerative changes lumbar spine and bilateral hip joints.   Original Report  Authenticated By: Natasha Mead, M.D.    Ct Abdomen Pelvis W Contrast  06/06/2012  *RADIOLOGY REPORT*  Clinical Data: Fever, abdominal pain and vomiting  CT ABDOMEN AND PELVIS WITH CONTRAST  Technique:  Multidetector CT imaging of the abdomen and pelvis was performed following the standard protocol during bolus administration of intravenous contrast.  Contrast: OMNIPAQUE IOHEXOL 300 MG/ML  SOLN  Comparison: 04/19/2011  Findings: There is a small left pleural effusion.  Lung bases are otherwise clear with changes of emphysema.  No focal liver abnormalities identified.  There is marked dilatation of the common bile duct which measures up to 1.6 cm, status post cholecystectomy.  Moderate intrahepatic bile duct dilatation is identified.  On 04/02/2010 the CBD measured 1.4 cm. No common bile duct stone or obstructing mass noted.  The pancreas appears within normal limits.  The spleen is unremarkable.  Bilateral adrenal glands appear normal.  Multiple bilateral renal cysts are identified. The kidneys are otherwise unremarkable.  No obstructive uropathy.  The urinary bladder appears within normal limits.  The uterus and the adnexal structures are unremarkable.  Borderline enlarged gastrohepatic ligament lymph node measures 1.1 cm, image 16. No pelvic or inguinal adenopathy identified.  No free fluid or fluid collections noted within the abdomen or pelvis.  Advanced calcified atherosclerotic disease affects the abdominal aorta and its branches.  The stomach appears within normal limits.  The small bowel loops are normal no evidence for bowel obstruction.  Normal appearance of the colon.  There is a scoliosis deformity involving the lumbar spine which is convex to the right.  There are compression deformities involving the HU and L3 vertebra. These have been treated with bone cement.  No acute bony abnormalities noted.  IMPRESSION:  1.  Chronic marked dilatation of the common bile duct with moderate intrahepatic bile  duct dilatation.  No obstructing stone or mass noted. 2.  Borderline enlarged gastrohepatic ligament lymph node. 3.  Small left pleural effusion.   Original Report Authenticated By: Rosealee Albee, M.D.    Dg Chest Portable 1 View  06/05/2012  *RADIOLOGY REPORT*  Clinical Data: Nausea, vomiting, shortness of breath, weakness, former smoker, COPD, hypertension  PORTABLE CHEST - 1 VIEW  Comparison: Portable exam 1436 hours compared to 05/01/2011  Findings: Upper normal heart size. Calcified mildly tortuous thoracic aorta. Mediastinal contours and pulmonary vascularity otherwise normal. Lungs appear emphysematous but clear. No pleural effusion or pneumothorax. Cardiac monitoring leads project over the lower chest. Bones diffusely demineralized.  IMPRESSION: COPD. No acute abnormalities.   Original Report Authenticated By: Lollie Marrow, M.D.     Micro Results     Recent Results (from the past 240 hour(s))  URINE CULTURE     Status: Normal   Collection Time   06/05/12  3:52 PM  Component Value Range Status Comment   Specimen Description URINE, CATHETERIZED   Final    Special Requests ADDED 06/05/12 2046   Final    Culture  Setup Time 06/05/2012 22:24   Final    Colony Count NO GROWTH   Final    Culture NO GROWTH   Final    Report Status 06/07/2012 FINAL   Final   CULTURE, BLOOD (ROUTINE X 2)     Status: Normal (Preliminary result)   Collection Time   06/05/12  6:45 PM      Component Value Range Status Comment   Specimen Description BLOOD HAND RIGHT   Final    Special Requests BOTTLES DRAWN AEROBIC AND ANAEROBIC 10CC   Final    Culture  Setup Time 06/06/2012 02:41   Final    Culture     Final    Value:        BLOOD CULTURE RECEIVED NO GROWTH TO DATE CULTURE WILL BE HELD FOR 5 DAYS BEFORE ISSUING A FINAL NEGATIVE REPORT   Report Status PENDING   Incomplete   CULTURE, BLOOD (ROUTINE X 2)     Status: Normal (Preliminary result)   Collection Time   06/05/12  6:55 PM      Component Value  Range Status Comment   Specimen Description BLOOD ARM RIGHT   Final    Special Requests BOTTLES DRAWN AEROBIC AND ANAEROBIC 10CC   Final    Culture  Setup Time 06/06/2012 02:41   Final    Culture     Final    Value:        BLOOD CULTURE RECEIVED NO GROWTH TO DATE CULTURE WILL BE HELD FOR 5 DAYS BEFORE ISSUING A FINAL NEGATIVE REPORT   Report Status PENDING   Incomplete      CBC w Diff:  Lab Results  Component Value Date   WBC 6.6 06/09/2012   HGB 12.1 06/09/2012   HCT 36.0 06/09/2012   PLT 279 06/09/2012   LYMPHOPCT 9* 06/06/2012   MONOPCT 3 06/06/2012   EOSPCT 0 06/06/2012   BASOPCT 0 06/06/2012    CMP:  Lab Results  Component Value Date   NA 141 06/10/2012   K 3.7 06/10/2012   CL 103 06/10/2012   CO2 29 06/10/2012   BUN 10 06/10/2012   CREATININE 0.86 06/10/2012   PROT 6.0 06/09/2012   ALBUMIN 2.7* 06/09/2012   BILITOT 0.3 06/09/2012   ALKPHOS 47 06/09/2012   AST 17 06/09/2012   ALT 9 06/09/2012  .   Discharge Instructions     Follow with Primary MD  in 3 days   Get CBC, CMP, checked 3 days by Primary MD and again as instructed by your Primary MD.    Get Medicines reviewed and adjusted.  Please request your Prim.MD to go over all Hospital Tests and Procedure/Radiological results at the follow up, please get all Hospital records sent to your Prim MD by signing hospital release before you go home.  Activity: As tolerated with Full fall precautions use walker/cane & assistance as needed   Diet:  Dysphagia 3 advance as tolerated to Heart healthy regular  For Heart failure patients - Check your Weight same time everyday, if you gain over 2 pounds, or you develop in leg swelling, experience more shortness of breath or chest pain, call your Primary MD immediately. Follow Cardiac Low Salt Diet and 1.8 lit/day fluid restriction.  Disposition SNF  If you experience worsening of your admission symptoms, develop shortness  of breath, life threatening emergency, suicidal or homicidal  thoughts you must seek medical attention immediately by calling 911 or calling your MD immediately  if symptoms less severe.  You Must read complete instructions/literature along with all the possible adverse reactions/side effects for all the Medicines you take and that have been prescribed to you. Take any new Medicines after you have completely understood and accpet all the possible adverse reactions/side effects.   Do not drive and provide baby sitting services if your were admitted for syncope or siezures until you have seen by Primary MD or a Neurologist and advised to do so again.  Do not drive when taking Pain medications.    Do not take more than prescribed Pain, Sleep and Anxiety Medications  Special Instructions: If you have smoked or chewed Tobacco  in the last 2 yrs please stop smoking, stop any regular Alcohol  and or any Recreational drug use.  Wear Seat belts while driving.  Follow-up Information    Follow up with DEFAULT,PROVIDER, MD. Schedule an appointment as soon as possible for a visit in 3 days.   Contact information:   [address] [place] [address] [phone]       Follow up with Freddy Jaksch, MD. Schedule an appointment as soon as possible for a visit in 1 week.   Contact information:   [address] [place] [address] [phone]            Discharge Medications     Medication List     As of 06/11/2012 10:24 AM    START taking these medications         ondansetron 4 MG tablet   Commonly known as: ZOFRAN   Take 1 tablet (4 mg total) by mouth every 8 (eight) hours as needed for nausea.      CHANGE how you take these medications         pantoprazole 40 MG tablet   Commonly known as: PROTONIX   Take 1 tablet (40 mg total) by mouth 2 (two) times daily.   What changed: how often to take the med      CONTINUE taking these medications         calcium-vitamin D 500-200 MG-UNIT per tablet   Commonly known as: OSCAL  WITH D      carvedilol 3.125 MG tablet   Commonly known as: COREG      citalopram 20 MG tablet   Commonly known as: CELEXA      clopidogrel 75 MG tablet   Commonly known as: PLAVIX      fluocinonide cream 0.05 %   Commonly known as: LIDEX      Fluticasone-Salmeterol 250-50 MCG/DOSE Aepb   Commonly known as: ADVAIR      ipratropium 0.02 % nebulizer solution   Commonly known as: ATROVENT      loperamide 2 MG capsule   Commonly known as: IMODIUM      magnesium oxide 400 MG tablet   Commonly known as: MAG-OX      oxyCODONE 5 MG immediate release tablet   Commonly known as: Oxy IR/ROXICODONE      predniSONE 1 MG tablet   Commonly known as: DELTASONE      saccharomyces boulardii 250 MG capsule   Commonly known as: FLORASTOR      sennosides-docusate sodium 8.6-50 MG tablet   Commonly known as: SENOKOT-S      STOP taking these medications  alendronate 70 MG tablet   Commonly known as: FOSAMAX          Where to get your medications       Information on where to get these meds is not yet available. Ask your nurse or doctor.         ondansetron 4 MG tablet   pantoprazole 40 MG tablet               Total Time in preparing paper work, data evaluation and todays exam - 35 minutes  Leroy Sea M.D on 06/11/2012 at 10:24 AM  [facility] Office  [phone]

## 2012-06-12 LAB — CULTURE, BLOOD (ROUTINE X 2): Culture: NO GROWTH

## 2012-10-14 ENCOUNTER — Emergency Department (HOSPITAL_COMMUNITY)
Admission: EM | Admit: 2012-10-14 | Discharge: 2012-10-14 | Disposition: A | Discharge status: Skilled Nursing Facility | Payer: Medicare Other | Attending: Emergency Medicine | Admitting: Emergency Medicine

## 2012-10-14 ENCOUNTER — Emergency Department (HOSPITAL_COMMUNITY): Payer: Medicare Other

## 2012-10-14 ENCOUNTER — Encounter (HOSPITAL_COMMUNITY): Payer: Self-pay | Admitting: Emergency Medicine

## 2012-10-14 DIAGNOSIS — Z87891 Personal history of nicotine dependence: Secondary | ICD-10-CM | POA: Insufficient documentation

## 2012-10-14 DIAGNOSIS — Z79899 Other long term (current) drug therapy: Secondary | ICD-10-CM | POA: Insufficient documentation

## 2012-10-14 DIAGNOSIS — J449 Chronic obstructive pulmonary disease, unspecified: Secondary | ICD-10-CM | POA: Insufficient documentation

## 2012-10-14 DIAGNOSIS — R296 Repeated falls: Secondary | ICD-10-CM | POA: Insufficient documentation

## 2012-10-14 DIAGNOSIS — Z8739 Personal history of other diseases of the musculoskeletal system and connective tissue: Secondary | ICD-10-CM | POA: Insufficient documentation

## 2012-10-14 DIAGNOSIS — S22009A Unspecified fracture of unspecified thoracic vertebra, initial encounter for closed fracture: Secondary | ICD-10-CM | POA: Insufficient documentation

## 2012-10-14 DIAGNOSIS — Z8639 Personal history of other endocrine, nutritional and metabolic disease: Secondary | ICD-10-CM | POA: Insufficient documentation

## 2012-10-14 DIAGNOSIS — Z8673 Personal history of transient ischemic attack (TIA), and cerebral infarction without residual deficits: Secondary | ICD-10-CM | POA: Insufficient documentation

## 2012-10-14 DIAGNOSIS — Y9210 Unspecified residential institution as the place of occurrence of the external cause: Secondary | ICD-10-CM | POA: Insufficient documentation

## 2012-10-14 DIAGNOSIS — Z8701 Personal history of pneumonia (recurrent): Secondary | ICD-10-CM | POA: Insufficient documentation

## 2012-10-14 DIAGNOSIS — J4489 Other specified chronic obstructive pulmonary disease: Secondary | ICD-10-CM | POA: Insufficient documentation

## 2012-10-14 DIAGNOSIS — S22000A Wedge compression fracture of unspecified thoracic vertebra, initial encounter for closed fracture: Secondary | ICD-10-CM

## 2012-10-14 DIAGNOSIS — F411 Generalized anxiety disorder: Secondary | ICD-10-CM | POA: Insufficient documentation

## 2012-10-14 DIAGNOSIS — Z7901 Long term (current) use of anticoagulants: Secondary | ICD-10-CM | POA: Insufficient documentation

## 2012-10-14 DIAGNOSIS — Y939 Activity, unspecified: Secondary | ICD-10-CM | POA: Insufficient documentation

## 2012-10-14 DIAGNOSIS — Z862 Personal history of diseases of the blood and blood-forming organs and certain disorders involving the immune mechanism: Secondary | ICD-10-CM | POA: Insufficient documentation

## 2012-10-14 DIAGNOSIS — I10 Essential (primary) hypertension: Secondary | ICD-10-CM | POA: Insufficient documentation

## 2012-10-14 LAB — BASIC METABOLIC PANEL
BUN: 12 mg/dL (ref 6–23)
CO2: 25 mEq/L (ref 19–32)
Chloride: 101 mEq/L (ref 96–112)
Creatinine, Ser: 0.79 mg/dL (ref 0.50–1.10)
Glucose, Bld: 92 mg/dL (ref 70–99)
Potassium: 4.5 mEq/L (ref 3.5–5.1)

## 2012-10-14 LAB — CBC WITH DIFFERENTIAL/PLATELET
HCT: 32.8 % — ABNORMAL LOW (ref 36.0–46.0)
Hemoglobin: 10.8 g/dL — ABNORMAL LOW (ref 12.0–15.0)
Lymphs Abs: 1.2 10*3/uL (ref 0.7–4.0)
MCH: 29.7 pg (ref 26.0–34.0)
MCHC: 32.9 g/dL (ref 30.0–36.0)
Monocytes Absolute: 0.4 10*3/uL (ref 0.1–1.0)
Monocytes Relative: 9 % (ref 3–12)
Neutro Abs: 3.2 10*3/uL (ref 1.7–7.7)
Neutrophils Relative %: 65 % (ref 43–77)
RBC: 3.64 MIL/uL — ABNORMAL LOW (ref 3.87–5.11)

## 2012-10-14 MED ORDER — HYDROMORPHONE HCL PF 1 MG/ML IJ SOLN
0.5000 mg | INTRAMUSCULAR | Status: DC | PRN
  Administered 2012-10-14 (×2): 0.5 mg via INTRAVENOUS
  Filled 2012-10-14 (×2): qty 1

## 2012-10-14 MED ORDER — HYDROMORPHONE HCL 2 MG PO TABS: 2.0000 mg | ORAL_TABLET | Freq: Four times a day (QID) | ORAL | Status: DC | PRN

## 2012-10-14 NOTE — ED Provider Notes (Signed)
History    CSN: [id number] Arrival date & time 10/14/12  1526 First MD Initiated Contact with Patient 10/14/12 1547    Chief Complaint  Patient presents with  . Back Pain    HPI Pt has had severe constant back pain for the last three weeks.  It all started after the patient was lifted by a caregiver.  The pain is in the upper back.  It is a constant dull pain.  It increases with movement.  Pt has not been evaluated for it however she has been receiving treatment for pain at the nursing home.  She has been given pain patches as well as oxycodone.  As part of her evaluation she did have neck xrays which were normal.  She was sent to the ED because of persistent pain and her history of osteoporosis.  Pt has history of stroke.  She has weakness on the right side and is not able to walk yet although she is getting therapy and can stand with some assistance.  Past Medical History  Diagnosis Date  . Stroke   . Pneumonia   . COPD (chronic obstructive pulmonary disease)   . Anxiety   . Hypertension   . Depression   . Thyroid nodule   . Headache     temple artiritis    Past Surgical History  Procedure Date  . Hernia repair   . Cholecystectomy   . Thyroid surgery   . Esophagogastroduodenoscopy 06/08/2012    Procedure: ESOPHAGOGASTRODUODENOSCOPY (EGD);  Surgeon: Florencia Reasons, MD;  Location: [facility] ENDOSCOPY;  Service: Endoscopy;  Laterality: N/A;    No family history on file.  History  Substance Use Topics  . Smoking status: Former Smoker    Quit date: 09/07/2008  . Smokeless tobacco: Not on file  . Alcohol Use: No    OB History    Grav Para Term Preterm Abortions TAB SAB Ect Mult Living                  Review of Systems  All other systems reviewed and are negative.    Allergies  Prozac and Codeine  Home Medications   Current Outpatient Rx  Name  Route  Sig  Dispense  Refill  . CALCIUM CARBONATE-VITAMIN D 500-200 MG-UNIT PO TABS   Oral   Take 1 tablet by mouth  daily.         Marland Kitchen CARVEDILOL 3.125 MG PO TABS   Oral   Take 3.125 mg by mouth 2 (two) times daily with a meal.         . CITALOPRAM HYDROBROMIDE 20 MG PO TABS   Oral   Take 20 mg by mouth daily.         Marland Kitchen CLOPIDOGREL BISULFATE 75 MG PO TABS   Oral   Take 75 mg by mouth daily.         Marland Kitchen FLUOCINONIDE 0.05 % EX CREA   Topical   Apply 1 application topically 2 (two) times daily.         Marland Kitchen FLUTICASONE-SALMETEROL 250-50 MCG/DOSE IN AEPB   Inhalation   Inhale 1 puff into the lungs every 12 (twelve) hours.         . IPRATROPIUM BROMIDE 0.02 % IN SOLN   Nebulization   Take 500 mcg by nebulization 4 (four) times daily.         Marland Kitchen LOPERAMIDE HCL 2 MG PO CAPS   Oral   Take 2 mg by mouth 4 (four) times  daily as needed.         Marland Kitchen MAGNESIUM OXIDE 400 MG PO TABS   Oral   Take 400 mg by mouth daily.         Marland Kitchen ONDANSETRON HCL 4 MG PO TABS   Oral   Take 1 tablet (4 mg total) by mouth every 8 (eight) hours as needed for nausea.   20 tablet   0   . OXYCODONE HCL 5 MG PO TABS   Oral   Take 1-2 tablets (5-10 mg total) by mouth every 4 (four) hours as needed. For pain   10 tablet   0   . PANTOPRAZOLE SODIUM 40 MG PO TBEC   Oral   Take 1 tablet (40 mg total) by mouth 2 (two) times daily.         Marland Kitchen PREDNISONE 1 MG PO TABS   Oral   Take 3 mg by mouth daily.         Marland Kitchen SACCHAROMYCES BOULARDII 250 MG PO CAPS   Oral   Take 250 mg by mouth 2 (two) times daily.         . SENNA-DOCUSATE SODIUM 8.6-50 MG PO TABS   Oral   Take 1 tablet by mouth daily.           BP 145/121  Pulse 81  Temp 98.1 F (36.7 C) (Oral)  Resp 14  SpO2 96%  Physical Exam  Nursing note and vitals reviewed. Constitutional: No distress.       Frail, elderly   HENT:  Head: Normocephalic and atraumatic.  Right Ear: External ear normal.  Left Ear: External ear normal.  Eyes: Conjunctivae normal are normal. Right eye exhibits no discharge. Left eye exhibits no discharge. No scleral  icterus.  Neck: Neck supple. No tracheal deviation present.  Cardiovascular: Normal rate, regular rhythm and intact distal pulses.   Pulmonary/Chest: Effort normal and breath sounds normal. No stridor. No respiratory distress. She has no wheezes. She has no rales.  Abdominal: Soft. Bowel sounds are normal. She exhibits no distension. There is no tenderness. There is no rebound and no guarding.  Musculoskeletal: She exhibits no edema and no tenderness.       Thoracic back: She exhibits tenderness and bony tenderness. She exhibits no swelling and no spasm.       Kyphosis   Neurological: She is alert. She has normal strength. No sensory deficit. Cranial nerve deficit:  no gross defecits noted. She exhibits normal muscle tone. She displays no seizure activity. Coordination normal.       Left hemiparesis, no movement lue, able to move lle although weaker than right  Skin: Skin is warm and dry. No rash noted.  Psychiatric: She has a normal mood and affect.    ED Course  Procedures (including critical care time)  Labs Reviewed  CBC WITH DIFFERENTIAL - Abnormal; Notable for the following:    RBC 3.64 (*)     Hemoglobin 10.8 (*)     HCT 32.8 (*)     All other components within normal limits  BASIC METABOLIC PANEL - Abnormal; Notable for the following:    GFR calc non Af Amer 77 (*)     GFR calc Af Amer 89 (*)     All other components within normal limits   Dg Thoracic Spine 2 View  10/14/2012  *RADIOLOGY REPORT*  Clinical Data: Upper back pain for 3 weeks.  THORACIC SPINE - 2 VIEW  Comparison: 06/05/2012 and multiple previous.  The only the lateral view of the chest is from 08/24/2008.  Findings: There are old compression deformities at T9 and T7 than were present in 2009.  Also seen are compression deformities at T6 and T8 which were not present at that time.  Their exact age is indeterminate however.  I do not see a fracture in the upper thoracic region.  IMPRESSION: Old compression fractures  at T7 and T9.  Compression fractures at T6 and T8 which were not present in 2009. Their exact age is indeterminate however.  They could be recent. Consider MRI of the spine for dating of compression fractures.   Original Report Authenticated By: Paulina Fusi, M.D.      1. Thoracic compression fracture       MDM  Patient is feeling better after medications for pain.  Discussed findings with Dr Yetta Barre.  Would not recommend any particular intervention at this time.  May benefit from MRI but this could be done as an outpatient.  Does not think that patient would benefit from kyphoplasty based on his review of the plain films.        Celene Kras, MD 10/14/12 [id number]

## 2012-10-14 NOTE — ED Notes (Signed)
Pt coming from [facility].

## 2012-10-14 NOTE — ED Notes (Signed)
Pt reports she was getting up at the nursing home and almost fell, the techs caught her and ever since then she has been having back pain. Pt denies tenderness to touch. Pt reports they have tried multiple different medications and haven't had much relief. Pt has pain patch on back.

## 2012-10-14 NOTE — ED Notes (Signed)
Per EMS - pt c/o back pain that started a few weeks ago and progressively gotten worse, to the point she cant take it anymore. BP 110/60 HR 70 RR 16. Pt reports she is unable to walk d/t a previous stroke about 4 yrs ago, pt has left sided deficits from stroke. Pt in nad.

## 2012-11-06 ENCOUNTER — Other Ambulatory Visit: Payer: Self-pay | Admitting: Neurological Surgery

## 2012-11-06 DIAGNOSIS — M546 Pain in thoracic spine: Secondary | ICD-10-CM

## 2012-11-13 ENCOUNTER — Ambulatory Visit
Admission: RE | Admit: 2012-11-13 | Discharge: 2012-11-13 | Disposition: A | Discharge status: Home / Self Care | Payer: Medicare Other | Source: Ambulatory Visit | Attending: Neurological Surgery | Admitting: Neurological Surgery

## 2012-11-13 DIAGNOSIS — M546 Pain in thoracic spine: Secondary | ICD-10-CM

## 2012-11-19 ENCOUNTER — Other Ambulatory Visit: Payer: Self-pay | Admitting: Neurological Surgery

## 2012-11-19 ENCOUNTER — Encounter (HOSPITAL_COMMUNITY): Payer: Self-pay | Admitting: Pharmacy Technician

## 2012-11-23 ENCOUNTER — Encounter (HOSPITAL_COMMUNITY): Payer: Self-pay | Admitting: *Deleted

## 2012-11-23 MED ORDER — CEFAZOLIN SODIUM-DEXTROSE 2-3 GM-% IV SOLR
2.0000 g | INTRAVENOUS | Status: AC
  Administered 2012-11-24: 2 g via INTRAVENOUS

## 2012-11-23 NOTE — Progress Notes (Signed)
I called [facility] and spoke with Consuella Lose, Mrs Player's nurse and went over and I updated history.  I gave instructions to [facility] for arrival time and medications patient cane take in am.  Pt takes Coreg in the evenings, so she will take that today.  Plavix has been stopped for a week.  I also placed a call to pts POA, Jenell Milliner and left a message informing her of arrival time.

## 2012-11-24 ENCOUNTER — Encounter (HOSPITAL_COMMUNITY): Payer: Self-pay | Admitting: *Deleted

## 2012-11-24 ENCOUNTER — Encounter (HOSPITAL_COMMUNITY)
Admission: RE | Disposition: A | Discharge status: Skilled Nursing Facility | Payer: Self-pay | Source: Ambulatory Visit | Attending: Neurological Surgery

## 2012-11-24 ENCOUNTER — Inpatient Hospital Stay (HOSPITAL_COMMUNITY)
Admission: RE | Admit: 2012-11-24 | Discharge: 2012-11-25 | DRG: 517 | Disposition: A | Discharge status: Skilled Nursing Facility | Payer: Medicare Other | Source: Ambulatory Visit | Attending: Neurological Surgery | Admitting: Neurological Surgery

## 2012-11-24 ENCOUNTER — Encounter (HOSPITAL_COMMUNITY): Payer: Self-pay | Admitting: Certified Registered"

## 2012-11-24 ENCOUNTER — Inpatient Hospital Stay (HOSPITAL_COMMUNITY): Payer: Medicare Other | Admitting: Certified Registered"

## 2012-11-24 ENCOUNTER — Inpatient Hospital Stay (HOSPITAL_COMMUNITY): Payer: Medicare Other

## 2012-11-24 DIAGNOSIS — M8448XA Pathological fracture, other site, initial encounter for fracture: Principal | ICD-10-CM | POA: Diagnosis present

## 2012-11-24 DIAGNOSIS — J449 Chronic obstructive pulmonary disease, unspecified: Secondary | ICD-10-CM | POA: Diagnosis present

## 2012-11-24 DIAGNOSIS — Z8673 Personal history of transient ischemic attack (TIA), and cerebral infarction without residual deficits: Secondary | ICD-10-CM

## 2012-11-24 DIAGNOSIS — Z87891 Personal history of nicotine dependence: Secondary | ICD-10-CM

## 2012-11-24 DIAGNOSIS — I10 Essential (primary) hypertension: Secondary | ICD-10-CM | POA: Diagnosis present

## 2012-11-24 DIAGNOSIS — Z7902 Long term (current) use of antithrombotics/antiplatelets: Secondary | ICD-10-CM

## 2012-11-24 DIAGNOSIS — J4489 Other specified chronic obstructive pulmonary disease: Secondary | ICD-10-CM | POA: Diagnosis present

## 2012-11-24 DIAGNOSIS — M8448XG Pathological fracture, other site, subsequent encounter for fracture with delayed healing: Secondary | ICD-10-CM

## 2012-11-24 DIAGNOSIS — M810 Age-related osteoporosis without current pathological fracture: Secondary | ICD-10-CM | POA: Diagnosis present

## 2012-11-24 DIAGNOSIS — F411 Generalized anxiety disorder: Secondary | ICD-10-CM | POA: Diagnosis present

## 2012-11-24 DIAGNOSIS — IMO0002 Reserved for concepts with insufficient information to code with codable children: Secondary | ICD-10-CM

## 2012-11-24 DIAGNOSIS — Z79899 Other long term (current) drug therapy: Secondary | ICD-10-CM

## 2012-11-24 DIAGNOSIS — F3289 Other specified depressive episodes: Secondary | ICD-10-CM | POA: Diagnosis present

## 2012-11-24 HISTORY — PX: KYPHOPLASTY: SHX5884

## 2012-11-24 HISTORY — DX: Gastritis, unspecified, without bleeding: K29.70

## 2012-11-24 HISTORY — DX: Personal history of other diseases of the circulatory system: Z86.79

## 2012-11-24 LAB — CBC
MCV: 89.2 fL (ref 78.0–100.0)
Platelets: 211 10*3/uL (ref 150–400)
RDW: 13.2 % (ref 11.5–15.5)
WBC: 6.1 10*3/uL (ref 4.0–10.5)

## 2012-11-24 LAB — BASIC METABOLIC PANEL
CO2: 29 mEq/L (ref 19–32)
Calcium: 9.3 mg/dL (ref 8.4–10.5)
Creatinine, Ser: 0.82 mg/dL (ref 0.50–1.10)
GFR calc non Af Amer: 66 mL/min — ABNORMAL LOW (ref >90)

## 2012-11-24 LAB — SURGICAL PCR SCREEN
MRSA, PCR: NEGATIVE
Staphylococcus aureus: NEGATIVE

## 2012-11-24 SURGERY — KYPHOPLASTY
Anesthesia: General | Wound class: Clean

## 2012-11-24 MED ORDER — PREDNISONE 1 MG PO TABS
3.0000 mg | ORAL_TABLET | Freq: Every day | ORAL | Status: DC
  Administered 2012-11-24: 3 mg via ORAL
  Filled 2012-11-24 (×2): qty 3

## 2012-11-24 MED ORDER — LACTATED RINGERS IV SOLN
INTRAVENOUS | Status: DC | PRN
  Administered 2012-11-24: 16:00:00 via INTRAVENOUS

## 2012-11-24 MED ORDER — FENTANYL CITRATE 0.05 MG/ML IJ SOLN
INTRAMUSCULAR | Status: DC | PRN
  Administered 2012-11-24: 100 ug via INTRAVENOUS
  Administered 2012-11-24 (×2): 25 ug via INTRAVENOUS
  Administered 2012-11-24: 50 ug via INTRAVENOUS

## 2012-11-24 MED ORDER — SACCHAROMYCES BOULARDII 250 MG PO CAPS
250.0000 mg | ORAL_CAPSULE | Freq: Two times a day (BID) | ORAL | Status: DC
  Administered 2012-11-24: 250 mg via ORAL
  Filled 2012-11-24 (×3): qty 1

## 2012-11-24 MED ORDER — PROPOFOL 10 MG/ML IV BOLUS
INTRAVENOUS | Status: DC | PRN
  Administered 2012-11-24: 80 mg via INTRAVENOUS

## 2012-11-24 MED ORDER — SODIUM CHLORIDE 0.9 % IJ SOLN: 3.0000 mL | INTRAMUSCULAR | Status: DC | PRN

## 2012-11-24 MED ORDER — MUPIROCIN 2 % EX OINT
TOPICAL_OINTMENT | Freq: Two times a day (BID) | CUTANEOUS | Status: DC
  Administered 2012-11-24: 14:00:00 via NASAL

## 2012-11-24 MED ORDER — CARVEDILOL 3.125 MG PO TABS
3.1250 mg | ORAL_TABLET | Freq: Every day | ORAL | Status: DC
  Administered 2012-11-24: 3.125 mg via ORAL
  Filled 2012-11-24 (×2): qty 1

## 2012-11-24 MED ORDER — BUPIVACAINE HCL (PF) 0.25 % IJ SOLN
INTRAMUSCULAR | Status: DC | PRN
  Administered 2012-11-24: 3 mL

## 2012-11-24 MED ORDER — OXYCODONE HCL 5 MG PO TABS
5.0000 mg | ORAL_TABLET | Freq: Once | ORAL | Status: DC | PRN
Start: 2012-11-24 — End: 2012-11-24

## 2012-11-24 MED ORDER — ONDANSETRON HCL 4 MG/2ML IJ SOLN
INTRAMUSCULAR | Status: DC | PRN
  Administered 2012-11-24: 4 mg via INTRAVENOUS

## 2012-11-24 MED ORDER — MOMETASONE FURO-FORMOTEROL FUM 100-5 MCG/ACT IN AERO
2.0000 | INHALATION_SPRAY | Freq: Two times a day (BID) | RESPIRATORY_TRACT | Status: DC
  Administered 2012-11-24 – 2012-11-25 (×2): 2 via RESPIRATORY_TRACT
  Filled 2012-11-24: qty 8.8

## 2012-11-24 MED ORDER — HYDROMORPHONE HCL PF 1 MG/ML IJ SOLN
INTRAMUSCULAR | Status: AC
  Filled 2012-11-24: qty 1

## 2012-11-24 MED ORDER — ROCURONIUM BROMIDE 100 MG/10ML IV SOLN
INTRAVENOUS | Status: DC | PRN
  Administered 2012-11-24: 30 mg via INTRAVENOUS

## 2012-11-24 MED ORDER — PHENOL 1.4 % MT LIQD: 1.0000 | OROMUCOSAL | Status: DC | PRN

## 2012-11-24 MED ORDER — PROMETHAZINE HCL 25 MG PO TABS: 25.0000 mg | ORAL_TABLET | Freq: Four times a day (QID) | ORAL | Status: DC | PRN

## 2012-11-24 MED ORDER — CALCIUM CARBONATE-VITAMIN D 500-200 MG-UNIT PO TABS
1.0000 | ORAL_TABLET | Freq: Two times a day (BID) | ORAL | Status: DC
  Administered 2012-11-24: 1 via ORAL
  Filled 2012-11-24 (×3): qty 1

## 2012-11-24 MED ORDER — CEFAZOLIN SODIUM-DEXTROSE 2-3 GM-% IV SOLR
INTRAVENOUS | Status: AC
  Filled 2012-11-24: qty 50

## 2012-11-24 MED ORDER — GLYCOPYRROLATE 0.2 MG/ML IJ SOLN
INTRAMUSCULAR | Status: DC | PRN
  Administered 2012-11-24: 0.4 mg via INTRAVENOUS

## 2012-11-24 MED ORDER — ACETAMINOPHEN 650 MG RE SUPP: 650.0000 mg | RECTAL | Status: DC | PRN

## 2012-11-24 MED ORDER — OXYCODONE HCL 5 MG PO TABS
5.0000 mg | ORAL_TABLET | ORAL | Status: DC | PRN
  Administered 2012-11-24: 5 mg via ORAL
  Administered 2012-11-25: 10 mg via ORAL
  Filled 2012-11-24: qty 2
  Filled 2012-11-24: qty 1

## 2012-11-24 MED ORDER — LIDOCAINE-EPINEPHRINE 1 %-1:100000 IJ SOLN
INTRAMUSCULAR | Status: DC | PRN
  Administered 2012-11-24: 3 mL via INTRADERMAL

## 2012-11-24 MED ORDER — ONDANSETRON HCL 4 MG/2ML IJ SOLN: 4.0000 mg | Freq: Once | INTRAMUSCULAR | Status: DC | PRN

## 2012-11-24 MED ORDER — HYDROMORPHONE HCL 2 MG PO TABS
2.0000 mg | ORAL_TABLET | Freq: Four times a day (QID) | ORAL | Status: DC | PRN
  Administered 2012-11-25: 2 mg via ORAL
  Filled 2012-11-24: qty 1

## 2012-11-24 MED ORDER — MEPERIDINE HCL 25 MG/ML IJ SOLN: 6.2500 mg | INTRAMUSCULAR | Status: DC | PRN

## 2012-11-24 MED ORDER — ACETAMINOPHEN 325 MG PO TABS
650.0000 mg | ORAL_TABLET | ORAL | Status: DC | PRN
  Administered 2012-11-24: 650 mg via ORAL
  Filled 2012-11-24: qty 2

## 2012-11-24 MED ORDER — ALUM & MAG HYDROXIDE-SIMETH 200-200-20 MG/5ML PO SUSP: 30.0000 mL | Freq: Four times a day (QID) | ORAL | Status: DC | PRN

## 2012-11-24 MED ORDER — CITALOPRAM HYDROBROMIDE 20 MG PO TABS
20.0000 mg | ORAL_TABLET | Freq: Every day | ORAL | Status: DC
  Administered 2012-11-24: 20 mg via ORAL
  Filled 2012-11-24 (×2): qty 1

## 2012-11-24 MED ORDER — MENTHOL 3 MG MT LOZG: 1.0000 | LOZENGE | OROMUCOSAL | Status: DC | PRN

## 2012-11-24 MED ORDER — CLOPIDOGREL BISULFATE 75 MG PO TABS
75.0000 mg | ORAL_TABLET | Freq: Every day | ORAL | Status: DC
  Administered 2012-11-24: 75 mg via ORAL
  Filled 2012-11-24 (×2): qty 1

## 2012-11-24 MED ORDER — MAGNESIUM OXIDE 400 MG PO TABS
400.0000 mg | ORAL_TABLET | Freq: Every day | ORAL | Status: DC
  Administered 2012-11-24: 400 mg via ORAL
  Filled 2012-11-24 (×2): qty 1

## 2012-11-24 MED ORDER — OXYCODONE HCL 5 MG/5ML PO SOLN: 5.0000 mg | Freq: Once | ORAL | Status: DC | PRN

## 2012-11-24 MED ORDER — LIDOCAINE HCL (CARDIAC) 20 MG/ML IV SOLN
INTRAVENOUS | Status: DC | PRN
  Administered 2012-11-24: 100 mg via INTRAVENOUS

## 2012-11-24 MED ORDER — MUPIROCIN 2 % EX OINT
TOPICAL_OINTMENT | CUTANEOUS | Status: AC
  Filled 2012-11-24: qty 22

## 2012-11-24 MED ORDER — ONDANSETRON HCL 4 MG/2ML IJ SOLN: 4.0000 mg | INTRAMUSCULAR | Status: DC | PRN

## 2012-11-24 MED ORDER — HYDROMORPHONE HCL PF 1 MG/ML IJ SOLN
0.2500 mg | INTRAMUSCULAR | Status: DC | PRN
  Administered 2012-11-24 (×4): 0.25 mg via INTRAVENOUS

## 2012-11-24 MED ORDER — SODIUM CHLORIDE 0.9 % IV SOLN
250.0000 mL | INTRAVENOUS | Status: DC
  Administered 2012-11-24: 500 mL via INTRAVENOUS

## 2012-11-24 MED ORDER — ARTIFICIAL TEARS OP OINT
TOPICAL_OINTMENT | OPHTHALMIC | Status: DC | PRN
  Administered 2012-11-24: 1 via OPHTHALMIC

## 2012-11-24 MED ORDER — SODIUM CHLORIDE 0.9 % IJ SOLN: 3.0000 mL | Freq: Two times a day (BID) | INTRAMUSCULAR | Status: DC

## 2012-11-24 MED ORDER — PANTOPRAZOLE SODIUM 40 MG PO TBEC
40.0000 mg | DELAYED_RELEASE_TABLET | Freq: Two times a day (BID) | ORAL | Status: DC
  Administered 2012-11-24: 40 mg via ORAL
  Filled 2012-11-24: qty 1

## 2012-11-24 MED ORDER — NEOSTIGMINE METHYLSULFATE 1 MG/ML IJ SOLN
INTRAMUSCULAR | Status: DC | PRN
  Administered 2012-11-24: 3 mg via INTRAVENOUS

## 2012-11-24 SURGICAL SUPPLY — 48 items
BANDAGE ADHESIVE 1X3 (GAUZE/BANDAGES/DRESSINGS) IMPLANT
BLADE SURG 11 STRL SS (BLADE) IMPLANT
BLADE SURG 15 STRL LF DISP TIS (BLADE) ×1 IMPLANT
BLADE SURG 15 STRL SS (BLADE) ×1
BLADE SURG ROTATE 9660 (MISCELLANEOUS) IMPLANT
CEMENT BONE KYPHX HV R (Orthopedic Implant) ×2 IMPLANT
CLOTH BEACON ORANGE TIMEOUT ST (SAFETY) ×2 IMPLANT
CONT SPEC 4OZ CLIKSEAL STRL BL (MISCELLANEOUS) ×4 IMPLANT
DECANTER SPIKE VIAL GLASS SM (MISCELLANEOUS) ×2 IMPLANT
DERMABOND ADVANCED (GAUZE/BANDAGES/DRESSINGS) ×2
DERMABOND ADVANCED .7 DNX12 (GAUZE/BANDAGES/DRESSINGS) ×2 IMPLANT
DRAPE C-ARM 42X72 X-RAY (DRAPES) ×2 IMPLANT
DRAPE INCISE IOBAN 66X45 STRL (DRAPES) ×2 IMPLANT
DRAPE LAPAROTOMY 100X72X124 (DRAPES) ×2 IMPLANT
DRAPE PROXIMA HALF (DRAPES) ×2 IMPLANT
DURAPREP 26ML APPLICATOR (WOUND CARE) ×2 IMPLANT
GAUZE SPONGE 4X4 16PLY XRAY LF (GAUZE/BANDAGES/DRESSINGS) ×2 IMPLANT
GLOVE BIO SURGEON STRL SZ7.5 (GLOVE) IMPLANT
GLOVE BIOGEL PI IND STRL 7.0 (GLOVE) ×1 IMPLANT
GLOVE BIOGEL PI IND STRL 7.5 (GLOVE) ×1 IMPLANT
GLOVE BIOGEL PI IND STRL 8.5 (GLOVE) ×1 IMPLANT
GLOVE BIOGEL PI INDICATOR 7.0 (GLOVE) ×1
GLOVE BIOGEL PI INDICATOR 7.5 (GLOVE) ×1
GLOVE BIOGEL PI INDICATOR 8.5 (GLOVE) ×1
GLOVE ECLIPSE 7.5 STRL STRAW (GLOVE) ×2 IMPLANT
GLOVE ECLIPSE 8.5 STRL (GLOVE) ×2 IMPLANT
GLOVE EXAM NITRILE LRG STRL (GLOVE) IMPLANT
GLOVE EXAM NITRILE MD LF STRL (GLOVE) IMPLANT
GLOVE EXAM NITRILE XL STR (GLOVE) IMPLANT
GLOVE EXAM NITRILE XS STR PU (GLOVE) IMPLANT
GLOVE SURG SS PI 7.0 STRL IVOR (GLOVE) ×2 IMPLANT
GOWN BRE IMP SLV AUR LG STRL (GOWN DISPOSABLE) IMPLANT
GOWN BRE IMP SLV AUR XL STRL (GOWN DISPOSABLE) ×4 IMPLANT
GOWN STRL REIN 2XL LVL4 (GOWN DISPOSABLE) ×2 IMPLANT
KIT BASIN OR (CUSTOM PROCEDURE TRAY) ×2 IMPLANT
KIT ROOM TURNOVER OR (KITS) ×2 IMPLANT
NEEDLE HYPO 25X1 1.5 SAFETY (NEEDLE) ×2 IMPLANT
NS IRRIG 1000ML POUR BTL (IV SOLUTION) ×2 IMPLANT
PACK SURGICAL SETUP 50X90 (CUSTOM PROCEDURE TRAY) ×2 IMPLANT
PAD ARMBOARD 7.5X6 YLW CONV (MISCELLANEOUS) ×6 IMPLANT
SPECIMEN JAR SMALL (MISCELLANEOUS) IMPLANT
SUT VIC AB 3-0 SH 8-18 (SUTURE) ×2 IMPLANT
SUT VIC AB 4-0 P-3 18X BRD (SUTURE) ×1 IMPLANT
SUT VIC AB 4-0 P3 18 (SUTURE) ×1
SYR CONTROL 10ML LL (SYRINGE) ×2 IMPLANT
TOWEL OR 17X24 6PK STRL BLUE (TOWEL DISPOSABLE) ×2 IMPLANT
TOWEL OR 17X26 10 PK STRL BLUE (TOWEL DISPOSABLE) ×2 IMPLANT
TRAY KYPHOPAK 15/3 ONESTEP 1ST (MISCELLANEOUS) ×2 IMPLANT

## 2012-11-24 NOTE — Anesthesia Preprocedure Evaluation (Addendum)
Anesthesia Evaluation  Patient identified by MRN, date of birth, ID band Patient awake    Reviewed: Allergy & Precautions, H&P , NPO status , Patient's Chart, lab work & pertinent test results, reviewed documented beta blocker date and time   Airway Mallampati: II TM Distance: >3 FB Neck ROM: Full    Dental  (+) Partial Upper, Poor Dentition and Dental Advisory Given   Pulmonary pneumonia -, COPD         Cardiovascular hypertension, Pt. on home beta blockers Rhythm:Regular     Neuro/Psych  Headaches, CVA, Residual Symptoms    GI/Hepatic   Endo/Other    Renal/GU      Musculoskeletal   Abdominal   Peds  Hematology   Anesthesia Other Findings   Reproductive/Obstetrics                          Anesthesia Physical Anesthesia Plan  ASA: III  Anesthesia Plan: General   Post-op Pain Management:    Induction: Intravenous  Airway Management Planned: Oral ETT  Additional Equipment:   Intra-op Plan:   Post-operative Plan: Extubation in OR  Informed Consent:   Plan Discussed with:   Anesthesia Plan Comments:         Anesthesia Quick Evaluation

## 2012-11-24 NOTE — Progress Notes (Signed)
Patient ID: Erica French, female   DOB: 1930-09-04, 79 y.o.   MRN: [id number] Vital signs stable patient reasonably comfortable. With the patient in hospital overnight. If stable transfer back to nursing facility tomorrow.

## 2012-11-24 NOTE — Anesthesia Procedure Notes (Signed)
Procedure Name: Intubation Date/Time: 11/24/2012 5:09 PM Performed by: Jefm Miles E Pre-anesthesia Checklist: Patient identified, Timeout performed, Emergency Drugs available, Suction available and Patient being monitored Patient Re-evaluated:Patient Re-evaluated prior to inductionOxygen Delivery Method: Circle system utilized Preoxygenation: Pre-oxygenation with 100% oxygen Intubation Type: IV induction Ventilation: Mask ventilation without difficulty Laryngoscope Size: Mac and 3 Grade View: Grade I Tube type: Oral Tube size: 7.0 mm Number of attempts: 1 Airway Equipment and Method: Stylet Placement Confirmation: ETT inserted through vocal cords under direct vision,  breath sounds checked- equal and bilateral and positive ETCO2 Secured at: 22 cm Tube secured with: Tape Dental Injury: Teeth and Oropharynx as per pre-operative assessment

## 2012-11-24 NOTE — Preoperative (Signed)
Beta Blockers   Reason not to administer Beta Blockers:Not Applicable 

## 2012-11-24 NOTE — Transfer of Care (Signed)
Immediate Anesthesia Transfer of Care Note  Patient: Erica French  Procedure(s) Performed: Procedure(s) with comments: KYPHOPLASTY (N/A) - Thoracic Five Thoracic Six and Thoracic Seven Acrylic Balloon Kyphoplasty  Patient Location: PACU  Anesthesia Type:General  Level of Consciousness: patient cooperative, lethargic and responds to stimulation  Airway & Oxygen Therapy: Patient Spontanous Breathing and Patient connected to nasal cannula oxygen  Post-op Assessment: Report given to PACU RN  Post vital signs: Reviewed and stable  Complications: No apparent anesthesia complications

## 2012-11-24 NOTE — Anesthesia Postprocedure Evaluation (Signed)
  Anesthesia Post-op Note  Patient: Erica French  Procedure(s) Performed: Procedure(s) with comments: KYPHOPLASTY (N/A) - Thoracic Five Thoracic Six and Thoracic Seven Acrylic Balloon Kyphoplasty  Patient Location: PACU  Anesthesia Type:General  Level of Consciousness: awake and alert   Airway and Oxygen Therapy: Patient Spontanous Breathing  Post-op Pain: mild  Post-op Assessment: Post-op Vital signs reviewed, Patient's Cardiovascular Status Stable, Respiratory Function Stable, Patent Airway and No signs of Nausea or vomiting  Post-op Vital Signs: Reviewed and stable  Complications: No apparent anesthesia complications

## 2012-11-24 NOTE — Op Note (Signed)
Preoperative diagnosis: T5 and T6 subacute compression fractures, old T7-T8 and T9 compression fractures . Osteoporosis Postoperative diagnosis: T5-T6 subacute compression fractures, acute on chronic T7 compression fracture, T8 and T9. Osteoporosis Procedure: T5, T6 and T7 acrylic balloon kyphoplasty. Surgeon: Barnett Abu Anesthesia: General endotracheal Indications: Patient is an 79 year old individual who's had previous acrylic balloon kyphoplasty at the thoracic lumbar junction. She has developed pain in the thoracic spine over several months. She was previously noted to have old compression fractures of T7-T8 and T9. More recent MRI demonstrates that she has subacute compression fractures of T5 and T6. She is taken to the operating room to undergo acrylic balloon kyphoplasty of these 2 vertebrae. During examination under fluoroscopy was noted that T7 had developed further compression. She had also acrylic balloon kyphoplasty of the T7 vertebrae at this setting.  Procedure: Patient was brought to the operating room supine on a stretcher after the smooth induction of general endotracheal anesthesia she was turned prone on the vertical rolls with the bony prominences appropriately padded and protected. Fluoroscopic visualization was then used in the AP and lateral projections to identify the thoracic vertebrae fractures of T78 and 9 were identified T6 was also identified having had an old fracture T7 had an old fracture that appeared to be more compressed. It was decided then to perform in addition to the kyphoplasty at the T5 and T6 levels the T7 vertebrae secondary to further fracture. Entry sites were chosen on the patient's left side and these were marked on the skin skin was infiltrated with 1 cc of lidocaine with epinephrine mixed 50-50 with half percent Marcaine. A stab incision was made with a 15 blade. A Jamshidi trocar was then placed via the far lateral approach into the vertebral body just below  the area of the pedicle. When the vertebrae was entered the inner trocar was removed and a bone drill was used at T5 the bone was noted to be very soft. At T6 that had a harder consistency. When T7 was done separately it was noted be very soft also. Balloons were then ablated first and T5 and T6. The balloon and T5 yielded a lower pressure of 80-90 mmHg with a total of 3 cc being injected into the balloon. Central filling of the vertebral defect was noted. At T6 very little filling with a high pressure 150 180 mmHg was achieved. T7 was then inflated and here the bone was soft also yielding only 70 or 80 mmHg resistance.  Then acrylic was mixed to appropriate consistency injected into each of the vertebrae. At T7 we are able to insert a total of 3 cc of cement. At T6 only one a half cc was able to be inserted with filling to the left side of the vertebrae. At T5 we are able to place 2-1/2 cc of cement. At this point it was noted that no further filling with be helpful in the procedure was completed the inner trocar is removed the Jamshidi trochars were removed no details were observed radiographically. 3-0 Vicryl sutures were placed in inverted to close the skin . He was returned to the recovery room in stable condition. The patient tolerated the procedure well.

## 2012-11-24 NOTE — H&P (Signed)
Erica French is an 79 y.o. female.   Chief Complaint: Thoracic back pain HPI:  Patient is an 79 year old individual is had acrylic balloon kyphoplasty of rectal lumbar junction for a compression fracture there last kyphoplasty was in July of 2012. Since that time the patient developed significant back pain the past few months. This is mostly in the thoracic spine. A plain radiograph did not demonstrate any acute evidence of fracture, however a recent thoracic MRI demonstrates subacute fractures of T5 and T6. Patient is now admitted to undergo acrylic balloon kyphoplasty at T5 and T6.  Past Medical History  Diagnosis Date  . COPD (chronic obstructive pulmonary disease)   . Anxiety   . Hypertension   . Depression   . Thyroid nodule   . Headache     temple artiritis  . Stroke     left sided weakness- transfers with walke rand assiast  . Pneumonia 05/2012  . H/O arteritis     Temporal  . Gastritis     Past Surgical History  Procedure Laterality Date  . Hernia repair    . Cholecystectomy    . Thyroid surgery    . Esophagogastroduodenoscopy  06/08/2012    Procedure: ESOPHAGOGASTRODUODENOSCOPY (EGD);  Surgeon: Florencia Reasons, MD;  Location: [facility] ENDOSCOPY;  Service: Endoscopy;  Laterality: N/A;  . Appendectomy    . Oophorectomy    . Kyphoplasty  2012    History reviewed. No pertinent family history. Social History:  reports that she quit smoking about 4 years ago. She does not have any smokeless tobacco history on file. She reports that she does not drink alcohol or use illicit drugs.  Allergies:  Allergies  Allergen Reactions  . Prozac (Fluoxetine Hcl) Anaphylaxis, Hives, Itching, Swelling and Rash  . Codeine Itching and Rash    Medications Prior to Admission  Medication Sig Dispense Refill  . calcium-vitamin D (OSCAL WITH D) 500-200 MG-UNIT per tablet Take 1 tablet by mouth 2 (two) times daily.      . carvedilol (COREG) 3.125 MG tablet Take 3.125 mg by mouth daily at 8 pm.       . citalopram (CELEXA) 20 MG tablet Take 20 mg by mouth daily.      . clopidogrel (PLAVIX) 75 MG tablet Take 75 mg by mouth daily.      . Fluticasone-Salmeterol (ADVAIR) 250-50 MCG/DOSE AEPB Inhale 1 puff into the lungs every 12 (twelve) hours as needed. For COPD      . magnesium oxide (MAG-OX) 400 MG tablet Take 400 mg by mouth daily.      Marland Kitchen oxyCODONE (OXY IR/ROXICODONE) 5 MG immediate release tablet Take 1-2 tablets (5-10 mg total) by mouth every 4 (four) hours as needed. For pain  10 tablet  0  . pantoprazole (PROTONIX) 40 MG tablet Take 1 tablet (40 mg total) by mouth 2 (two) times daily.      . predniSONE (DELTASONE) 1 MG tablet Take 3 mg by mouth daily.      Marland Kitchen saccharomyces boulardii (FLORASTOR) 250 MG capsule Take 250 mg by mouth 2 (two) times daily.      Marland Kitchen HYDROmorphone (DILAUDID) 2 MG tablet Take 1 tablet (2 mg total) by mouth every 6 (six) hours as needed for pain.  20 tablet  0  . promethazine (PHENERGAN) 25 MG tablet Take 25 mg by mouth every 6 (six) hours as needed. For nausea and vomiting        Results for orders placed during the hospital encounter of  11/24/12 (from the past 48 hour(s))  SURGICAL PCR SCREEN     Status: None   Collection Time    11/24/12  1:27 PM      Result Value Range   MRSA, PCR NEGATIVE  NEGATIVE   Staphylococcus aureus NEGATIVE  NEGATIVE   Comment:            The Xpert SA Assay (FDA     approved for NASAL specimens     in patients over 67 years of age),     is one component of     a comprehensive surveillance     program.  Test performance has     been validated by [employer] for patients greater     than or equal to 44 year old.     It is not intended     to diagnose infection nor to     guide or monitor treatment.  BASIC METABOLIC PANEL     Status: Abnormal   Collection Time    11/24/12  1:34 PM      Result Value Range   Sodium 137  135 - 145 mEq/L   Potassium 4.3  3.5 - 5.1 mEq/L   Chloride 102  96 - 112 mEq/L   CO2 29  19 - 32  mEq/L   Glucose, Bld 128 (*) 70 - 99 mg/dL   BUN 16  6 - 23 mg/dL   Creatinine, Ser 1.61  0.50 - 1.10 mg/dL   Calcium 9.3  8.4 - 09.6 mg/dL   GFR calc non Af Amer 66 (*) >90 mL/min   GFR calc Af Amer 76 (*) >90 mL/min   Comment:            The eGFR has been calculated     using the CKD EPI equation.     This calculation has not been     validated in all clinical     situations.     eGFR's persistently     <90 mL/min signify     possible Chronic Kidney Disease.  CBC     Status: Abnormal   Collection Time    11/24/12  1:34 PM      Result Value Range   WBC 6.1  4.0 - 10.5 K/uL   RBC 3.99  3.87 - 5.11 MIL/uL   Hemoglobin 11.9 (*) 12.0 - 15.0 g/dL   HCT 04.5 (*) 40.9 - 81.1 %   MCV 89.2  78.0 - 100.0 fL   MCH 29.8  26.0 - 34.0 pg   MCHC 33.4  30.0 - 36.0 g/dL   RDW 91.4  78.2 - 95.6 %   Platelets 211  150 - 400 K/uL   Dg Chest 2 View  11/24/2012  *RADIOLOGY REPORT*  Clinical Data: Preop Kyphoplasty.  CHEST - 2 VIEW  Comparison: 11/13/2012  Findings: Normal heart size.  No pleural effusion or edema.  Lungs are hyperinflated but clear.  There are coarsened interstitial markings noted bilaterally.  Calcified atherosclerotic disease affects the tortuous and unfolded thoracic aorta.  The bones are osteopenic and there are compression deformities noted within the thoracic spine.  IMPRESSION:  1.  No acute cardiopulmonary abnormalities. 2.  COPD. 3.  Thoracic compression fractures.   Original Report Authenticated By: Signa Kell, M.D.     Review of Systems  HENT: Negative.   Eyes: Negative.   Respiratory: Negative.   Cardiovascular: Negative.   Gastrointestinal: Negative.   Genitourinary: Negative.  Musculoskeletal: Positive for back pain.  Skin: Negative.   Neurological: Positive for weakness.  Endo/Heme/Allergies: Negative.   Psychiatric/Behavioral: Negative.     Blood pressure 114/72, pulse 75, temperature 97.5 F (36.4 C), temperature source Oral, resp. rate 18, height 5'  7" (1.702 m), weight 54.432 kg (120 lb), SpO2 100.00%. Physical Exam  Constitutional: She is oriented to person, place, and time.  Frail-appearing  HENT:  Head: Normocephalic and atraumatic.  Eyes: Conjunctivae and EOM are normal. Pupils are equal, round, and reactive to light.  Neck: Normal range of motion. Neck supple.  Cardiovascular: Normal rate and regular rhythm.   Respiratory: Effort normal and breath sounds normal.  GI: Soft. Bowel sounds are normal.  Musculoskeletal: She exhibits tenderness.  Tender in thoracic spine  Neurological: She is alert and oriented to person, place, and time. She has normal reflexes.  Skin: Skin is warm and dry.  Psychiatric: She has a normal mood and affect. Her behavior is normal. Judgment and thought content normal.     Assessment/Plan 11/16/2012:  I had a phone conversation with Prezley Qadir daughter, named Jenell Milliner.  I contacted her today regarding the results of the MRI from the 28th of February which demonstrates that there is a subacute compression fracture of the inferior endplate of T5 and the superior endplate and vertebral body of T6.  I noted that these fractures are not completely recent, but there is still significant edema within the vertebrae, suggesting that they are indeed subacute.  Given the fact that Waunita is having substantial difficulty with pain, she would like to proceed with acrylic balloon kyphoplasty and we will arrange this for her at T5 and T6.  She is staying currently at [facility] under the care of Dr. Allena Katz there, but I would hope that we would have to admit her only for an overnight stay at the most as transportation is difficult for her.    The patient is being admitted today 11/24/2012 for acrylic balloon kyphoplasty of T5 and T6.  ELSNER,HENRY J 11/24/2012, 4:49 PM

## 2012-11-25 NOTE — Progress Notes (Signed)
Pt's daughter given D/C instructions with understanding. Pt D/C'd to [facility] via ambulance @ 1100 per MD order. Rema Fendt, RN

## 2012-11-25 NOTE — Discharge Summary (Signed)
Physician Discharge Summary  Patient ID: Erica French MRN: [id number] DOB/AGE: 09/23/1929 79 y.o.  Admit date: 11/24/2012 Discharge date: 11/25/2012  Admission Diagnoses: Pathological vertebral fractures T5-T6 and T7, acute and subacute. Osteoporosis  Discharge Diagnoses: Pathological vertebral fractures T5-T6 and T7, acute and subacute. Osteoporosis Active Problems:   * No active hospital problems. *   Discharged Condition: fair  Hospital Course: Patient was admitted to undergo acrylic balloon kyphoplasty of T5-T6 and T7. These were performed successfully. Patient still has substantial pain on movement.  Consults: None  Significant Diagnostic Studies: MRI thoracic spine  Treatments: surgery: Acrylic balloon kyphoplasty T5-T6 and T7  Discharge Exam: Blood pressure 140/78, pulse 83, temperature 98.1 F (36.7 C), temperature source Oral, resp. rate 16, height 5\' 7"  (1.702 m), weight 54.432 kg (120 lb), SpO2 98.00%. Incision/Wound: patient is frail appearing elderly individual with delicate skin and some abrasions on her skin on her back. Motor function is intact in lower extremities patient can only weight bear and transfer.  Disposition: 03-Skilled Nursing Facility  Discharge Orders   Future Orders Complete By Expires     Call MD for:  redness, tenderness, or signs of infection (pain, swelling, redness, odor or green/yellow discharge around incision site)  As directed     Call MD for:  severe uncontrolled pain  As directed     Call MD for:  temperature >100.4  As directed     Diet - low sodium heart healthy  As directed     Discharge instructions  As directed     Comments:      Okay to shower. Do not apply salves or appointments to incision. No heavy lifting with the upper extremities greater than 15 pounds. May resume driving when not requiring pain medication and patient feels comfortable with doing so. Okay to remove Band-Aids from surgical site if present.    Increase  activity slowly  As directed         Medication List    TAKE these medications       calcium-vitamin D 500-200 MG-UNIT per tablet  Commonly known as:  OSCAL WITH D  Take 1 tablet by mouth 2 (two) times daily.     carvedilol 3.125 MG tablet  Commonly known as:  COREG  Take 3.125 mg by mouth daily at 8 pm.     citalopram 20 MG tablet  Commonly known as:  CELEXA  Take 20 mg by mouth daily.     clopidogrel 75 MG tablet  Commonly known as:  PLAVIX  Take 75 mg by mouth daily.     Fluticasone-Salmeterol 250-50 MCG/DOSE Aepb  Commonly known as:  ADVAIR  Inhale 1 puff into the lungs every 12 (twelve) hours as needed. For COPD     HYDROmorphone 2 MG tablet  Commonly known as:  DILAUDID  Take 1 tablet (2 mg total) by mouth every 6 (six) hours as needed for pain.     magnesium oxide 400 MG tablet  Commonly known as:  MAG-OX  Take 400 mg by mouth daily.     oxyCODONE 5 MG immediate release tablet  Commonly known as:  Oxy IR/ROXICODONE  Take 1-2 tablets (5-10 mg total) by mouth every 4 (four) hours as needed. For pain     pantoprazole 40 MG tablet  Commonly known as:  PROTONIX  Take 1 tablet (40 mg total) by mouth 2 (two) times daily.     predniSONE 1 MG tablet  Commonly known as:  DELTASONE  Take  3 mg by mouth daily.     promethazine 25 MG tablet  Commonly known as:  PHENERGAN  Take 25 mg by mouth every 6 (six) hours as needed. For nausea and vomiting     saccharomyces boulardii 250 MG capsule  Commonly known as:  FLORASTOR  Take 250 mg by mouth 2 (two) times daily.         SignedStefani Dama 11/25/2012, 9:21 AM

## 2012-11-28 ENCOUNTER — Encounter (HOSPITAL_COMMUNITY): Payer: Self-pay | Admitting: Neurological Surgery

## 2012-12-02 NOTE — Progress Notes (Signed)
Utilization review completed. Keilany Burnette, RN, BSN. 

## 2012-12-08 ENCOUNTER — Non-Acute Institutional Stay (SKILLED_NURSING_FACILITY): Payer: Medicare Other | Admitting: Nurse Practitioner

## 2012-12-08 ENCOUNTER — Encounter: Payer: Self-pay | Admitting: Nurse Practitioner

## 2012-12-08 DIAGNOSIS — K5900 Constipation, unspecified: Secondary | ICD-10-CM | POA: Insufficient documentation

## 2012-12-08 DIAGNOSIS — M545 Low back pain, unspecified: Secondary | ICD-10-CM

## 2012-12-08 DIAGNOSIS — J449 Chronic obstructive pulmonary disease, unspecified: Secondary | ICD-10-CM

## 2012-12-08 DIAGNOSIS — R634 Abnormal weight loss: Secondary | ICD-10-CM | POA: Insufficient documentation

## 2012-12-08 DIAGNOSIS — K209 Esophagitis, unspecified without bleeding: Secondary | ICD-10-CM

## 2012-12-08 DIAGNOSIS — M810 Age-related osteoporosis without current pathological fracture: Secondary | ICD-10-CM

## 2012-12-08 DIAGNOSIS — K560 Paralytic ileus: Secondary | ICD-10-CM

## 2012-12-08 DIAGNOSIS — R1310 Dysphagia, unspecified: Secondary | ICD-10-CM | POA: Insufficient documentation

## 2012-12-08 DIAGNOSIS — Z8673 Personal history of transient ischemic attack (TIA), and cerebral infarction without residual deficits: Secondary | ICD-10-CM

## 2012-12-08 DIAGNOSIS — K929 Disease of digestive system, unspecified: Secondary | ICD-10-CM

## 2012-12-08 DIAGNOSIS — R109 Unspecified abdominal pain: Secondary | ICD-10-CM

## 2012-12-08 DIAGNOSIS — K567 Ileus, unspecified: Secondary | ICD-10-CM | POA: Insufficient documentation

## 2012-12-08 DIAGNOSIS — G8929 Other chronic pain: Secondary | ICD-10-CM | POA: Insufficient documentation

## 2012-12-08 DIAGNOSIS — K9189 Other postprocedural complications and disorders of digestive system: Secondary | ICD-10-CM

## 2012-12-08 MED ORDER — PREDNISONE 1 MG PO TABS: 1.0000 mg | ORAL_TABLET | Freq: Every day | ORAL | Status: AC

## 2012-12-08 MED ORDER — ALUMINUM & MAGNESIUM HYDROXIDE 200-200 MG/5ML PO SUSP: 30.0000 mL | Freq: Three times a day (TID) | ORAL | Status: AC

## 2012-12-08 MED ORDER — METOCLOPRAMIDE HCL 5 MG PO TABS: 5.0000 mg | ORAL_TABLET | Freq: Four times a day (QID) | ORAL | Status: DC

## 2012-12-08 MED ORDER — ACETAMINOPHEN 325 MG PO TABS: 650.0000 mg | ORAL_TABLET | Freq: Three times a day (TID) | ORAL | Status: DC

## 2012-12-08 MED ORDER — DISPOSABLE ENEMA 19-7 GM/118ML RE ENEM: 1.0000 | ENEMA | Freq: Once | RECTAL | Status: DC

## 2012-12-08 MED ORDER — NUTRITIONAL SUPPLEMENT PLUS PO LIQD: 120.0000 mL | Freq: Three times a day (TID) | ORAL | Status: DC

## 2012-12-08 MED ORDER — MIRTAZAPINE 7.5 MG PO TABS: 7.5000 mg | ORAL_TABLET | Freq: Every day | ORAL | Status: DC

## 2012-12-08 MED ORDER — POLYETHYLENE GLYCOL 3350 17 GM/SCOOP PO POWD: 17.0000 g | Freq: Every day | ORAL | Status: AC

## 2012-12-08 NOTE — Assessment & Plan Note (Addendum)
Had kyphoplasty on 11/24/12, has been c/o nausea. Kub done yesterday showed "mild ileus, slightly worse with mild scattered retained feces.  1) Will start her on full liquid diet, soups and jello, continue oral supplements and push po fluids x 3 days.  2) Reglan 5mg  po ac/hs x 5 days to improve gastric motility. 3) Maalox 30cc po tid x 2 days to help pass flatus.

## 2012-12-08 NOTE — Assessment & Plan Note (Signed)
On Oxycodone prn for pain. Has allergy to codeine, which she says causes a rash. She has a rash to her shoulders and forearms and has seen dermatology that is still not resolved. She has been on roxicodone for some time. 1) will d/c Roxicodone due to gi upset and unresolved rash. 2) will increase tylenol to 650mg  tid for chronic pain. 3) continue Lidoderm patch to lower back.

## 2012-12-08 NOTE — Assessment & Plan Note (Addendum)
She has hx of copd and is taking Advair and prednisone 3mg  po qd. Her respiratory status has been stable. No uri or pneumonia. C/O abdominal pain and nausea. Xray shows mild ileus slightly worse, with retained feces. Prednisone may cause gi upset.  1) Will decrease prednisone to 1mg  po qd. And monitor.

## 2012-12-08 NOTE — Assessment & Plan Note (Signed)
Not taking anything daily for constipation. 1) Will start Miralax 17gm po qd mix in juice or h2o.

## 2012-12-08 NOTE — Assessment & Plan Note (Addendum)
Pt has poor appetite and poor po intake. Her weight is now 109.6 lbs, down from 116.8 = - 7.2 lbs. She is on Mighty shakes tid w/ meals per dietician consult.  1) continue mighty shakes. 2) start remeron 7.5mg  po qhs for appetite stimulant. 3) will check pre-albumin, albumin levels.

## 2012-12-08 NOTE — Progress Notes (Signed)
Subjective:    Patient ID: Erica French, female    DOB: 04-Feb-1930, 79 y.o.   MRN: [id number]  Abdominal Pain This is a recurrent problem. The current episode started in the past 7 days. The onset quality is gradual. The problem occurs intermittently. The problem has been gradually worsening. The pain is located in the generalized abdominal region. The pain is at a severity of 2/10. The pain is mild. The quality of the pain is colicky. The abdominal pain does not radiate. Associated symptoms include anorexia, constipation, nausea and weight loss. Nothing aggravates the pain. The pain is relieved by nothing.      Review of Systems  Constitutional: Positive for weight loss, activity change and appetite change.  HENT: Negative.   Eyes: Negative.   Respiratory: Negative.   Cardiovascular: Negative.   Gastrointestinal: Positive for nausea, abdominal pain, constipation and anorexia. Negative for abdominal distention.  Endocrine: Negative.   Genitourinary: Negative.   Allergic/Immunologic: Negative.   Neurological: Negative.   Hematological: Negative.   Psychiatric/Behavioral: Negative.        Objective:   Physical Exam  Constitutional: She is oriented to person, place, and time. She appears distressed.  Eyes: Pupils are equal, round, and reactive to light.  Cardiovascular: Normal rate and regular rhythm.   Pulmonary/Chest: Effort normal and breath sounds normal.  Abdominal: Soft. She exhibits no distension and no mass. There is no tenderness. There is no rebound and no guarding.  BS are hypoactive.  Musculoskeletal:  Hx of osteoporosis with recent kyphoplasty. + moderate scoliosis, + mild kyphosis.   Neurological: She is alert and oriented to person, place, and time.  Skin: Skin is warm and dry. Rash noted.  Psychiatric: She has a normal mood and affect.   12/07/12: KUB - mild ileus, slightly worse, no bowel obstruction, mild scattered retained colorectal feces.  12/01/12:  wbc 7.0, hgb 12.1, hct 36.3, plt 261, na 142, k 4.0, glucose 118, bun 11, creatinine 0.86, alk phos 78, ast 23, alt 10, total protein 7.1, albumin 3.9, calcium 9.9.   10/01/12: magnesium 1.8.       Assessment & Plan:  COPD (chronic obstructive pulmonary disease) She has hx of copd and is taking Advair and prednisone 3mg  po qd. Her respiratory status has been stable. No uri or pneumonia. C/O abdominal pain and nausea. Xray shows mild ileus slightly worse, with retained feces. Prednisone may cause gi upset.  1) Will decrease prednisone to 1mg  po qd. And monitor.  Abdominal  pain, other specified site Pt c/o abdominal pain and nausea x 3 days. She recently underwent kyphoplasty on 11/24/12 by Dr. Danielle Dess. Since then she has had poor po intake and very sleepy. She was on Ultram for pain which was dc'd yesterday due to nausea. She had been on Dilaudid prn and Oxycodone scheduled which were dc'd due to oversedation and poor po intake. She received IV fluids last week with some improvement. Kub was done which showed ileus with retained feces.  Ileus, postoperative Had kyphoplasty on 11/24/12, has been c/o nausea. Kub done yesterday showed "mild ileus, slightly worse with mild scattered retained feces.  1) Will start her on full liquid diet, soups and jello, continue oral supplements and push po fluids x 3 days.  2) Reglan 5mg  po ac/hs x 5 days to improve gastric motility. 3) Maalox 30cc po tid x 2 days to help pass flatus.  Unspecified constipation Not taking anything daily for constipation. 1) Will start Miralax 17gm po qd mix  in juice or h2o.  Chronic lower back pain On Oxycodone prn for pain. Has allergy to codeine, which she says causes a rash. She has a rash to her shoulders and forearms and has seen dermatology that is still not resolved. She has been on roxicodone for some time. 1) will d/c Roxicodone due to gi upset and unresolved rash. 2) will increase tylenol to 650mg  tid for chronic  pain. 3) continue Lidoderm patch to lower back.  Loss of weight Pt has poor appetite and poor po intake. Her weight is now 109.6 lbs, down from 116.8 = - 7.2 lbs. She is on Mighty shakes tid w/ meals per dietician consult.  1) continue mighty shakes. 2) start remeron 7.5mg  po qhs for appetite stimulant. 3) will check pre-albumin, albumin levels.   Esophagitis Stable, continue protonix.  Hx of arterial ischemic stroke Stable, continue Plavix.  Osteoporosis, unspecified Takes calcium 500 + D bid and magnesium 400mg  po qd. Has taken Forteo inj in past x 2 years (max for lifetime). She was taking Fosamax and was dc'd due to gastritis per GI, Dr. Matthias Hughs - per family.  1) will check vitamin d level and magnesium.   - Constipation cont: Fleet enema pr x 1 now for retained feces.  - Will draw albumin, prealbumin, vitamin d, magnesium, cbc and bmp next lab day.

## 2012-12-08 NOTE — Assessment & Plan Note (Addendum)
Takes calcium 500 + D bid and magnesium 400mg  po qd. Has taken Forteo inj in past x 2 years (max for lifetime). She was taking Fosamax and was dc'd due to gastritis per GI, Dr. Matthias Hughs - per family.  1) will check vitamin d level and magnesium.

## 2012-12-08 NOTE — Assessment & Plan Note (Signed)
Pt c/o abdominal pain and nausea x 3 days. She recently underwent kyphoplasty on 11/24/12 by Dr. Danielle Dess. Since then she has had poor po intake and very sleepy. She was on Ultram for pain which was dc'd yesterday due to nausea. She had been on Dilaudid prn and Oxycodone scheduled which were dc'd due to oversedation and poor po intake. She received IV fluids last week with some improvement. Kub was done which showed ileus with retained feces.

## 2012-12-08 NOTE — Assessment & Plan Note (Signed)
On modified diet. Was seen by speech. No changes to diet consistency per speech.

## 2012-12-08 NOTE — Assessment & Plan Note (Signed)
Stable, continue Plavix 

## 2012-12-08 NOTE — Assessment & Plan Note (Signed)
Stable, continue protonix

## 2012-12-09 ENCOUNTER — Other Ambulatory Visit: Payer: Self-pay | Admitting: Geriatric Medicine

## 2012-12-09 MED ORDER — FENTANYL 12 MCG/HR TD PT72: 1.0000 | MEDICATED_PATCH | TRANSDERMAL | Status: DC

## 2012-12-16 ENCOUNTER — Non-Acute Institutional Stay (SKILLED_NURSING_FACILITY): Payer: Medicare Other | Admitting: Nurse Practitioner

## 2012-12-16 DIAGNOSIS — R630 Anorexia: Secondary | ICD-10-CM

## 2012-12-16 DIAGNOSIS — F3289 Other specified depressive episodes: Secondary | ICD-10-CM

## 2012-12-16 DIAGNOSIS — R627 Adult failure to thrive: Secondary | ICD-10-CM

## 2012-12-16 DIAGNOSIS — M810 Age-related osteoporosis without current pathological fracture: Secondary | ICD-10-CM

## 2012-12-16 DIAGNOSIS — R52 Pain, unspecified: Secondary | ICD-10-CM

## 2012-12-16 DIAGNOSIS — F329 Major depressive disorder, single episode, unspecified: Secondary | ICD-10-CM

## 2012-12-16 DIAGNOSIS — F32A Depression, unspecified: Secondary | ICD-10-CM

## 2012-12-18 ENCOUNTER — Other Ambulatory Visit: Payer: Self-pay | Admitting: *Deleted

## 2012-12-18 MED ORDER — FENTANYL 50 MCG/HR TD PT72: 1.0000 | MEDICATED_PATCH | TRANSDERMAL | Status: DC

## 2012-12-21 ENCOUNTER — Encounter: Payer: Self-pay | Admitting: Nurse Practitioner

## 2012-12-21 ENCOUNTER — Non-Acute Institutional Stay (SKILLED_NURSING_FACILITY): Payer: Medicare Other | Admitting: Nurse Practitioner

## 2012-12-21 DIAGNOSIS — M25552 Pain in left hip: Secondary | ICD-10-CM

## 2012-12-21 DIAGNOSIS — M1990 Unspecified osteoarthritis, unspecified site: Secondary | ICD-10-CM

## 2012-12-21 DIAGNOSIS — R404 Transient alteration of awareness: Secondary | ICD-10-CM

## 2012-12-21 DIAGNOSIS — R109 Unspecified abdominal pain: Secondary | ICD-10-CM

## 2012-12-21 DIAGNOSIS — M25559 Pain in unspecified hip: Secondary | ICD-10-CM

## 2012-12-21 DIAGNOSIS — R400 Somnolence: Secondary | ICD-10-CM

## 2012-12-21 NOTE — Progress Notes (Signed)
  Subjective:    Patient ID: Erica French, female    DOB: 1930-08-18, 79 y.o.   MRN: [id number]  HPI Comments: Pt was seen today for follow up of pain. She has been off oxycodone due to allergy. The rash has improved significantly. She was started on Fentanyl patch 12 mcg q 72 hr yesterday , along with scheduled tylenol and lidoderm patch to her lower back. She says that her pain is still not controlled. She has severe osteoporosis and is s/p Kyphoplasty by Dr. Danielle Dess. Her daughter is here and is concerned that her mother may have another fracture, and wants an xray. She asks if there is a fracture, what would I do? I asked what did the surgeon tell you? Daughter says that he told them that there wasn't anything else he could do for her surgically. I explained that I could treat her pain here, and consult palliative care to assist with options and wiith her fragile bones she may continue to suffer fractures. She has not lost any weight. Her appetite is poor.  Is on Celexa for depression. The daughter thinks she may be depressed and is wanting to give up, and describes her mood as "snappy" at times. Daughter mentioned that a cna told her mother that all of the pain meds were taken away. I reviewed meds with daughter and pt, and I explained that she had been on oxycodone so long that she may associate pain control with taking a pill, and now that she has a Fentanyl patch she may not feel she is not getting anything. I explained that the patch could be titrated up if necessary.      Review of Systems  Constitutional: Positive for activity change and appetite change.  HENT: Negative.   Eyes: Negative.   Respiratory: Negative.   Cardiovascular: Negative.   Gastrointestinal: Negative.   Endocrine: Negative.   Genitourinary: Negative.   Musculoskeletal: Positive for back pain and arthralgias.  Skin: Negative.   Allergic/Immunologic: Negative.   Neurological: Negative.   Hematological: Negative.    Psychiatric/Behavioral: Negative.        Objective:   Physical Exam  Constitutional: She appears distressed.  Eyes: Pupils are equal, round, and reactive to light.  Cardiovascular: Normal rate and regular rhythm.   Pulmonary/Chest: Effort normal and breath sounds normal.  Musculoskeletal:  + c/o tenderness to lower back. + mild kyphosis, + mild scoliosis.   Neurological: She is alert.  Skin: Skin is warm and dry. She is not diaphoretic.          Assessment & Plan:  1) will increase Fentanyl patch to to skin q 72 hrs for pain. D/C fentanyl patch. 2) palliative care consult for pain control and failure to thrive. On remeron for appetite and dietary supplements. On prostat for low albumin. Has been seen by facility dietician. 3) weekly weights to monitor for weight loss. 4) [facility] psych consult for depression, pt currently on celexa.

## 2012-12-22 ENCOUNTER — Non-Acute Institutional Stay (SKILLED_NURSING_FACILITY): Payer: Medicare Other | Admitting: Nurse Practitioner

## 2012-12-22 ENCOUNTER — Encounter: Payer: Self-pay | Admitting: Nurse Practitioner

## 2012-12-22 ENCOUNTER — Other Ambulatory Visit: Payer: Self-pay | Admitting: *Deleted

## 2012-12-22 DIAGNOSIS — R400 Somnolence: Secondary | ICD-10-CM

## 2012-12-22 DIAGNOSIS — R451 Restlessness and agitation: Secondary | ICD-10-CM

## 2012-12-22 DIAGNOSIS — IMO0002 Reserved for concepts with insufficient information to code with codable children: Secondary | ICD-10-CM

## 2012-12-22 DIAGNOSIS — R404 Transient alteration of awareness: Secondary | ICD-10-CM

## 2012-12-22 DIAGNOSIS — F411 Generalized anxiety disorder: Secondary | ICD-10-CM

## 2012-12-22 DIAGNOSIS — K5900 Constipation, unspecified: Secondary | ICD-10-CM

## 2012-12-22 MED ORDER — FENTANYL 25 MCG/HR TD PT72: MEDICATED_PATCH | TRANSDERMAL | Status: DC

## 2012-12-22 MED ORDER — FENTANYL 25 MCG/HR TD PT72: 1.0000 | MEDICATED_PATCH | TRANSDERMAL | Status: DC

## 2012-12-22 MED ORDER — DIAZEPAM 2 MG PO TABS: ORAL_TABLET | ORAL | Status: DC

## 2012-12-22 MED ORDER — FLUTICASONE-SALMETEROL 250-50 MCG/DOSE IN AEPB: 1.0000 | INHALATION_SPRAY | Freq: Every day | RESPIRATORY_TRACT | Status: DC

## 2012-12-22 MED ORDER — DIAZEPAM 2 MG PO TABS: 1.0000 mg | ORAL_TABLET | Freq: Every evening | ORAL | Status: DC | PRN

## 2012-12-22 NOTE — Progress Notes (Signed)
  Subjective:    Patient ID: Erica French, female    DOB: Oct 19, 1929, 79 y.o.   MRN: [id number]  HPI Comments: Pt was seen today for follow up of med changes for pain. She was started on valium 2.5mg  bid for chronic pain and anxiety, and Fentanyl patch was increased to 50 mcg. Per daughter she has been very sleepy, too sleepy to eat or drink. Pt says that her pain control has improved and is good today, but she is very sleepy. Pt is c/o left flank and back/ hip pain. Denies trauma or falls, has hx of osteoarthritis. She denies dysuria, but her daughter says that she has asked for the bedpan and has been unable to void at times. She is afebrile, sb/p < 100, on Coreg with parameters.     Review of Systems  Constitutional: Positive for activity change and appetite change.  HENT: Negative.   Eyes: Negative.   Respiratory: Negative.   Cardiovascular: Negative.   Gastrointestinal: Negative.   Endocrine: Negative.   Genitourinary: Positive for dysuria and flank pain.  Musculoskeletal: Positive for back pain.  Skin: Negative.   Allergic/Immunologic: Negative.   Neurological: Negative.   Hematological: Negative.   Psychiatric/Behavioral: Negative.        Objective:   Physical Exam  Constitutional: No distress.  Eyes: Pupils are equal, round, and reactive to light.  Cardiovascular: Normal rate and regular rhythm.   Pulmonary/Chest: Effort normal and breath sounds normal.  Abdominal: Soft. Bowel sounds are normal.  Musculoskeletal: She exhibits tenderness.  Left sided hemiparesis. + Flank tenderness and tenderness to left hip.  Neurological:  Sleepy, but easily aroused.  Skin: Skin is warm and dry. She is not diaphoretic.  Psychiatric: She has a normal mood and affect.          Assessment & Plan:  Increased somnolence with poor po intake. C/O flank pain with dysuria.  1)  Will d/c am dose of valium, and hold tonight's dose of valium. 2)  Obtain ua c&s in & out cath to r/o  uti. 3)  Xray left hip and pelvis and lower back. 4) cbc, bmp in am.

## 2012-12-22 NOTE — Progress Notes (Signed)
  Subjective:    Patient ID: Erica French, female    DOB: 28-Mar-1930, 79 y.o.   MRN: [id number]  HPI Comments: I saw pt to follow up somnolence and on xray and labs ordered. When I walked in room she was in bed leaning to the left and asleep. Her breakfast tray was in front of her and she had not eaten or had a drink of anything on the tray. She was easily aroused and says that she was not hungry. I asked how her pain was, and she replied "good, I'm not in pain". She mentioned that she couldn't move her legs, covers were pulled back and she was able to move her legs, left leg she was able to move with decreased rom due to cva. I attribute this to her increased somnolence. Xrays were done of left hip, pelvis, and lower back which were negative for fracture or dislocation. Xray does show constipation. Urine studies and bloodwork are pending. Her weight on 12/02/12 was 109.6, on 12/17/12 her weight was 105.4 lbs. She was 112 lbs at the beginning of March. She is on Remeron for appetite, po supplements per dietician.     Review of Systems  Constitutional: Positive for activity change, appetite change and fatigue.  HENT: Negative.   Eyes: Negative.   Respiratory: Negative.   Cardiovascular: Negative.   Gastrointestinal: Negative.   Endocrine: Negative.   Genitourinary: Negative.   Musculoskeletal: Negative.   Skin: Negative.   Allergic/Immunologic: Negative.   Neurological: Negative.   Hematological: Negative.   Psychiatric/Behavioral: Negative.        Objective:   Physical Exam  Constitutional: No distress.  Cardiovascular: Normal rate and regular rhythm.   Pulmonary/Chest: Effort normal and breath sounds normal.  Neurological:  Increased somnolence.  Skin: Skin is warm and dry. She is not diaphoretic.  Psychiatric: She has a normal mood and affect.    12/22/11: xray left hip and pelvis - no displaced fracture identified. Severe degenerative changes are noted at the lumbar spine, SI  joints and hips. There is central and inferior joint space narrowing. There is osteopenia. Constipation is noted.        Assessment & Plan:  1) continue weekly weights, po supplements and remeron. 2) Will decrease Fentanyl patch due to over sedation. D/C fentanyl patch. Start Fentanyl patch q 72 hrs for pain. 3) D/C valium 2.5mg  at bedtime. 4) NS 0.9% @ 100 cc/hr x 1 liter. Keep NSL in until labs drawn this am return - somnolence. 5) Bisacodyl 5mg  po qd, 1st today for constipation. 6) Valium 2mg  give (1/2 tab = 1mg ) at bedtime prn for pain or anxiety. Hold for sedation.

## 2012-12-25 ENCOUNTER — Encounter (HOSPITAL_COMMUNITY): Payer: Self-pay | Admitting: *Deleted

## 2012-12-25 ENCOUNTER — Emergency Department (HOSPITAL_COMMUNITY)
Admission: EM | Admit: 2012-12-25 | Discharge: 2012-12-25 | Disposition: A | Discharge status: Home / Self Care | Payer: Medicare Other | Attending: Emergency Medicine | Admitting: Emergency Medicine

## 2012-12-25 DIAGNOSIS — R197 Diarrhea, unspecified: Secondary | ICD-10-CM | POA: Insufficient documentation

## 2012-12-25 DIAGNOSIS — Z8679 Personal history of other diseases of the circulatory system: Secondary | ICD-10-CM | POA: Insufficient documentation

## 2012-12-25 DIAGNOSIS — J449 Chronic obstructive pulmonary disease, unspecified: Secondary | ICD-10-CM | POA: Insufficient documentation

## 2012-12-25 DIAGNOSIS — J4489 Other specified chronic obstructive pulmonary disease: Secondary | ICD-10-CM | POA: Insufficient documentation

## 2012-12-25 DIAGNOSIS — F329 Major depressive disorder, single episode, unspecified: Secondary | ICD-10-CM | POA: Insufficient documentation

## 2012-12-25 DIAGNOSIS — Z8701 Personal history of pneumonia (recurrent): Secondary | ICD-10-CM | POA: Insufficient documentation

## 2012-12-25 DIAGNOSIS — Z79899 Other long term (current) drug therapy: Secondary | ICD-10-CM | POA: Insufficient documentation

## 2012-12-25 DIAGNOSIS — Z8719 Personal history of other diseases of the digestive system: Secondary | ICD-10-CM | POA: Insufficient documentation

## 2012-12-25 DIAGNOSIS — Z87891 Personal history of nicotine dependence: Secondary | ICD-10-CM | POA: Insufficient documentation

## 2012-12-25 DIAGNOSIS — F3289 Other specified depressive episodes: Secondary | ICD-10-CM | POA: Insufficient documentation

## 2012-12-25 DIAGNOSIS — Z8639 Personal history of other endocrine, nutritional and metabolic disease: Secondary | ICD-10-CM | POA: Insufficient documentation

## 2012-12-25 DIAGNOSIS — F411 Generalized anxiety disorder: Secondary | ICD-10-CM | POA: Insufficient documentation

## 2012-12-25 DIAGNOSIS — R112 Nausea with vomiting, unspecified: Secondary | ICD-10-CM

## 2012-12-25 DIAGNOSIS — Z862 Personal history of diseases of the blood and blood-forming organs and certain disorders involving the immune mechanism: Secondary | ICD-10-CM | POA: Insufficient documentation

## 2012-12-25 DIAGNOSIS — Z8673 Personal history of transient ischemic attack (TIA), and cerebral infarction without residual deficits: Secondary | ICD-10-CM | POA: Insufficient documentation

## 2012-12-25 DIAGNOSIS — N390 Urinary tract infection, site not specified: Secondary | ICD-10-CM | POA: Insufficient documentation

## 2012-12-25 DIAGNOSIS — I10 Essential (primary) hypertension: Secondary | ICD-10-CM | POA: Insufficient documentation

## 2012-12-25 LAB — CBC
MCH: 29.2 pg (ref 26.0–34.0)
MCV: 87.2 fL (ref 78.0–100.0)
Platelets: 374 10*3/uL (ref 150–400)
RBC: 3.97 MIL/uL (ref 3.87–5.11)
RDW: 14.3 % (ref 11.5–15.5)

## 2012-12-25 LAB — URINE MICROSCOPIC-ADD ON

## 2012-12-25 LAB — COMPREHENSIVE METABOLIC PANEL
AST: 25 U/L (ref 0–37)
CO2: 26 mEq/L (ref 19–32)
Calcium: 9.6 mg/dL (ref 8.4–10.5)
Creatinine, Ser: 0.89 mg/dL (ref 0.50–1.10)
GFR calc non Af Amer: 60 mL/min — ABNORMAL LOW (ref >90)

## 2012-12-25 LAB — URINALYSIS, ROUTINE W REFLEX MICROSCOPIC
Bilirubin Urine: NEGATIVE
Ketones, ur: 15 mg/dL — AB
Protein, ur: 100 mg/dL — AB
Urobilinogen, UA: 0.2 mg/dL (ref 0.0–1.0)

## 2012-12-25 MED ORDER — ONDANSETRON HCL 4 MG PO TABS: 4.0000 mg | ORAL_TABLET | Freq: Four times a day (QID) | ORAL | Status: DC

## 2012-12-25 MED ORDER — SODIUM CHLORIDE 0.9 % IV BOLUS (SEPSIS)
500.0000 mL | Freq: Once | INTRAVENOUS | Status: AC
  Administered 2012-12-25: 500 mL via INTRAVENOUS

## 2012-12-25 MED ORDER — ONDANSETRON HCL 4 MG/2ML IJ SOLN
4.0000 mg | Freq: Once | INTRAMUSCULAR | Status: AC
  Administered 2012-12-25: 4 mg via INTRAVENOUS
  Filled 2012-12-25: qty 2

## 2012-12-25 MED ORDER — CEPHALEXIN 500 MG PO CAPS: 500.0000 mg | ORAL_CAPSULE | Freq: Four times a day (QID) | ORAL | Status: DC

## 2012-12-25 MED ORDER — DEXTROSE 5 % IV SOLN
1.0000 g | Freq: Once | INTRAVENOUS | Status: AC
  Administered 2012-12-25: 1 g via INTRAVENOUS
  Filled 2012-12-25: qty 10

## 2012-12-25 NOTE — ED Notes (Signed)
Pt. With one week hx. Of nv/d and abdominal pain.  Pt. is from [facility].  Pt. Is rehabbing from back surgery.  Pt. Is note with decreased appetite.  Pt. denies fever or SOB.

## 2012-12-25 NOTE — ED Notes (Signed)
IV start tried 2 and no success. IV team paged.

## 2012-12-25 NOTE — ED Notes (Signed)
Pt waiting for transport by [facility]. 

## 2012-12-25 NOTE — ED Provider Notes (Signed)
History     CSN: [id number]  Arrival date & time 12/25/12  1646   First MD Initiated Contact with Patient 12/25/12 1652      Chief Complaint  Patient presents with  . Nausea  . Emesis  . Diarrhea  . Abdominal Pain    (Consider location/radiation/quality/duration/timing/severity/associated sxs/prior treatment) HPI Pt presenting from [facility] with c/o nausea/vomiting and diarrhea.  Pt states symptoms have been ongoing for the past 6 days.  Multiple episodes of emesis- nonblooy and nonbilious, also watery diarrhea without blood or mucous.  Pt denies abdominal pain.  No fever/chills.  Has had decreased urination.  Also states she has not been able to keep any liquids down by mouth for the past several days.  Has been getting nausea meds at [facility] place which she state help to settle her stomach somewhat.  Feels very weak and tired.  There are no other associated systemic symptoms, there are no other alleviating or modifying factors.   Past Medical History  Diagnosis Date  . COPD (chronic obstructive pulmonary disease)   . Anxiety   . Hypertension   . Depression   . Thyroid nodule   . Headache     temple artiritis  . Stroke     left sided weakness- transfers with walke rand assiast  . Pneumonia 05/2012  . H/O arteritis     Temporal  . Gastritis     Past Surgical History  Procedure Laterality Date  . Hernia repair    . Cholecystectomy    . Thyroid surgery    . Esophagogastroduodenoscopy  06/08/2012    Procedure: ESOPHAGOGASTRODUODENOSCOPY (EGD);  Surgeon: Florencia Reasons, MD;  Location: [facility] ENDOSCOPY;  Service: Endoscopy;  Laterality: N/A;  . Appendectomy    . Oophorectomy    . Kyphoplasty  2012  . Kyphoplasty N/A 11/24/2012    Procedure: KYPHOPLASTY;  Surgeon: Barnett Abu, MD;  Location: [facility] NEURO ORS;  Service: Neurosurgery;  Laterality: N/A;  Thoracic Five Thoracic Six and Thoracic Seven Acrylic Balloon Kyphoplasty    History reviewed. No pertinent family  history.  History  Substance Use Topics  . Smoking status: Former Smoker    Quit date: 09/07/2008  . Smokeless tobacco: Not on file  . Alcohol Use: No    OB History   Grav Para Term Preterm Abortions TAB SAB Ect Mult Living                  Review of Systems ROS reviewed and all otherwise negative except for mentioned in HPI  Allergies  Prozac and Codeine  Home Medications   Current Outpatient Rx  Name  Route  Sig  Dispense  Refill  . bisacodyl (DULCOLAX) 5 MG EC tablet   Oral   Take 5 mg by mouth daily. Take every day per MAR         . calcium-vitamin D (OSCAL WITH D) 500-200 MG-UNIT per tablet   Oral   Take 1 tablet by mouth 2 (two) times daily.         . carvedilol (COREG) 3.125 MG tablet   Oral   Take 1.562 mg by mouth daily. Take 1/2 of 3.125mg  = 1.562mg  po qd.         . citalopram (CELEXA) 20 MG tablet   Oral   Take 20 mg by mouth daily.         . clopidogrel (PLAVIX) 75 MG tablet   Oral   Take 75 mg by mouth daily.         Marland Kitchen  DULoxetine (CYMBALTA) 20 MG capsule   Oral   Take 20 mg by mouth daily.         . fentaNYL (DURAGESIC) 25 MCG/HR      Apply one patch every 72 hours.*remove old patch*   10 patch   0   . Fluticasone-Salmeterol (ADVAIR) 250-50 MCG/DOSE AEPB   Inhalation   Inhale 1 puff into the lungs at bedtime. For COPD   60 each      . lidocaine (LIDODERM) 5 %   Transdermal   Place 1 patch onto the skin daily. Remove & Discard patch within 12 hours or as directed by MD         . magnesium oxide (MAG-OX) 400 MG tablet   Oral   Take 400 mg by mouth daily.         . mirtazapine (REMERON) 7.5 MG tablet   Oral   Take 1 tablet (7.5 mg total) by mouth at bedtime.   30 tablet   3   . pantoprazole (PROTONIX) 40 MG tablet   Oral   Take 1 tablet (40 mg total) by mouth 2 (two) times daily.         . polyethylene glycol powder (GLYCOLAX/MIRALAX) powder   Oral   Take 17 g by mouth daily.   3350 g   1   . predniSONE  (DELTASONE) 1 MG tablet   Oral   Take 1 tablet (1 mg total) by mouth daily.   30 tablet   3   . cephALEXin (KEFLEX) 500 MG capsule   Oral   Take 1 capsule (500 mg total) by mouth 4 (four) times daily.   28 capsule   0   . diazepam (VALIUM) 2 MG tablet      Take 1/2 tablet every night at bedtime as needed for pain/anxiety/spasm   30 tablet   5   . ondansetron (ZOFRAN) 4 MG tablet   Oral   Take 1 tablet (4 mg total) by mouth every 6 (six) hours.   12 tablet   0     BP 107/71  Pulse 90  Temp(Src) 97.8 F (36.6 C) (Oral)  Resp 13  SpO2 97% Vitals reviewed Physical Exam Physical Examination: General appearance - alert, well appearing, and in no distress Mental status - alert, oriented to person, place, and time Eyes - no scleral icterus, no conjunctival injection Mouth - mucous membranes moist, pharynx normal without lesions Chest - clear to auscultation, no wheezes, rales or rhonchi, symmetric air entry Heart - normal rate, regular rhythm, normal S1, S2, no murmurs, rubs, clicks or gallops Abdomen - soft, nontender, nondistended, no masses or organomegaly Neurological - alert, oriented, normal speech, strength 5/5 in extremities x4, sensation intact Extremities - peripheral pulses normal, no pedal edema, no clubbing or cyanosis Skin - normal coloration and turgor, no rashes  ED Course  Procedures (including critical care time)   Date: 12/25/2012  Rate: 97  Rhythm: normal sinus rhythm  QRS Axis: left  Intervals: normal  ST/T Wave abnormalities: nonspecific ST/T changes  Conduction Disutrbances:none  Narrative Interpretation:   Old EKG Reviewed: no significant changes noted compared to prior ekg of 06/07/12   Labs Reviewed  URINALYSIS, ROUTINE W REFLEX MICROSCOPIC - Abnormal; Notable for the following:    APPearance TURBID (*)    Specific Gravity, Urine 1.033 (*)    Hgb urine dipstick LARGE (*)    Ketones, ur 15 (*)    Protein, ur 100 (*)  Nitrite POSITIVE  (*)    Leukocytes, UA LARGE (*)    All other components within normal limits  CBC - Abnormal; Notable for the following:    Hemoglobin 11.6 (*)    HCT 34.6 (*)    All other components within normal limits  COMPREHENSIVE METABOLIC PANEL - Abnormal; Notable for the following:    Glucose, Bld 104 (*)    Albumin 2.9 (*)    Alkaline Phosphatase 122 (*)    GFR calc non Af Amer 60 (*)    GFR calc Af Amer 69 (*)    All other components within normal limits  URINE MICROSCOPIC-ADD ON - Abnormal; Notable for the following:    Squamous Epithelial / LPF FEW (*)    Bacteria, UA MANY (*)    All other components within normal limits  URINE CULTURE  LIPASE, BLOOD   No results found.   1. Urinary tract infection   2. Nausea vomiting and diarrhea       MDM  Pt presenting with c/o nausea, vomiting and diarrhea over the past week.  No blood in stools.  Abdominal exam benign.  Urinalysis c/w UTI.  Pt has normal vital signs, received IV hydration and nausea meds.  Rocephin IV given, she will be discharged with nausea med rx and to finish course of keflex for UTI.  She has tolerated a po challenge and states nausea is resolved. Urine culture sent.  With no abdominal tenderness I have a low suspicion for intraabdominal pathology such as sbo or colitis.  Discharged with strict return precautions.  Pt agreeable with plan.        Ethelda Chick, MD 12/25/12 2122

## 2012-12-25 NOTE — ED Notes (Signed)
Pt able to drink fluid and denies nausea.

## 2012-12-25 NOTE — ED Notes (Signed)
Pt. Is also noted with left sided deficits related to a previous CVA.

## 2012-12-28 ENCOUNTER — Non-Acute Institutional Stay (SKILLED_NURSING_FACILITY): Payer: Medicare Other | Admitting: Internal Medicine

## 2012-12-28 DIAGNOSIS — R5381 Other malaise: Secondary | ICD-10-CM

## 2012-12-28 DIAGNOSIS — R630 Anorexia: Secondary | ICD-10-CM

## 2012-12-28 DIAGNOSIS — F329 Major depressive disorder, single episode, unspecified: Secondary | ICD-10-CM

## 2012-12-28 DIAGNOSIS — R197 Diarrhea, unspecified: Secondary | ICD-10-CM

## 2012-12-28 DIAGNOSIS — G894 Chronic pain syndrome: Secondary | ICD-10-CM

## 2012-12-28 DIAGNOSIS — K219 Gastro-esophageal reflux disease without esophagitis: Secondary | ICD-10-CM

## 2012-12-28 DIAGNOSIS — F32A Depression, unspecified: Secondary | ICD-10-CM

## 2012-12-28 DIAGNOSIS — I10 Essential (primary) hypertension: Secondary | ICD-10-CM

## 2012-12-28 DIAGNOSIS — N390 Urinary tract infection, site not specified: Secondary | ICD-10-CM

## 2012-12-28 DIAGNOSIS — R531 Weakness: Secondary | ICD-10-CM

## 2012-12-28 DIAGNOSIS — F3289 Other specified depressive episodes: Secondary | ICD-10-CM

## 2012-12-28 LAB — URINE CULTURE: Colony Count: >=100000

## 2012-12-28 NOTE — Progress Notes (Signed)
Patient ID: Erica French, female   DOB: Jan 30, 1930, 79 y.o.   MRN: [id number]  CC- medical management of chronic illness  Allergies  Allergen Reactions  . Prozac (Fluoxetine Hcl) Anaphylaxis, Hives, Itching, Swelling and Rash  . Codeine Itching and Rash   Code status- DNR  HPI- 66 y/p female patient is a LTC resident. She recently had abominal discomfort and there were concerns for ileus as per xray report and she was started on bowel regimen. She also had a recent ED visit on 12/25/12 for decreased appetite and nausea due to family request. She was diagnosed with uti and started on po antibiotics and sent back. She has been having loose stool since this am. Denies any cramp in the abdomen but has been nauseous. Denies vomiting. She feels low in terms of her mood. She has been seen by psych and was recently taken off celexa and started on cymbalta. She has flat affect and says does not have an appetite. Her back pain persists but is better with fentanyl and lidoderm patch. She feels fine otherwise. Is wheelchair dependent and moslty on her bed.  Review of Systems  Constitutional: Positive for malaise/fatigue. Negative for fever, chills and diaphoresis.  HENT: Negative for ear pain and congestion.   Eyes: Negative for blurred vision.  Respiratory: Negative for cough and sputum production.   Cardiovascular: Negative for chest pain, palpitations and leg swelling.  Gastrointestinal: Positive for nausea, abdominal pain and diarrhea. Negative for heartburn and vomiting.  Genitourinary: Negative for dysuria.  Musculoskeletal: Positive for back pain. Negative for falls.  Skin: Negative for rash.  Neurological: Negative for dizziness, focal weakness, weakness and headaches.  Psychiatric/Behavioral: Positive for depression. Negative for suicidal ideas and hallucinations. The patient is not nervous/anxious and does not have insomnia.    Past Medical History  Diagnosis Date  . COPD (chronic  obstructive pulmonary disease)   . Anxiety   . Hypertension   . Depression   . Thyroid nodule   . Headache     temple artiritis  . Stroke     left sided weakness- transfers with walke rand assiast  . Pneumonia 05/2012  . H/O arteritis     Temporal  . Gastritis    Past Surgical History  Procedure Laterality Date  . Hernia repair    . Cholecystectomy    . Thyroid surgery    . Esophagogastroduodenoscopy  06/08/2012    Procedure: ESOPHAGOGASTRODUODENOSCOPY (EGD);  Surgeon: Florencia Reasons, MD;  Location: [facility] ENDOSCOPY;  Service: Endoscopy;  Laterality: N/A;  . Appendectomy    . Oophorectomy    . Kyphoplasty  2012  . Kyphoplasty N/A 11/24/2012    Procedure: KYPHOPLASTY;  Surgeon: Barnett Abu, MD;  Location: [facility] NEURO ORS;  Service: Neurosurgery;  Laterality: N/A;  Thoracic Five Thoracic Six and Thoracic Seven Acrylic Balloon Kyphoplasty    BP 133/60  Pulse 88  Temp(Src) 97.9 F (36.6 C)  Resp 16  SpO2 96%  Physical Exam  Constitutional: She is oriented to person, place, and time. She appears well-developed and well-nourished. No distress.  HENT:  Head: Normocephalic and atraumatic.  Mouth/Throat: Oropharynx is clear and moist.  Eyes: Conjunctivae are normal. Pupils are equal, round, and reactive to light.  Neck: Normal range of motion. Neck supple. No JVD present.  Cardiovascular: Normal rate and regular rhythm.   Pulmonary/Chest: Effort normal and breath sounds normal. No respiratory distress.  Abdominal: Soft. Bowel sounds are normal. She exhibits no distension.  Musculoskeletal: Normal range of  motion. She exhibits no edema and no tenderness.  Low back pain  Lymphadenopathy:    She has no cervical adenopathy.  Neurological: She is alert and oriented to person, place, and time. No cranial nerve deficit.  Skin: Skin is warm and dry. She is not diaphoretic.  Psychiatric: She has a normal mood and affect.   ASSESSMENT/PLAN- Depression- continue cymbalta. Will increase  rememron to 15 mg po daily to help both with her mood and appetite  Chronic pain- continue fentanyl patch and lidoderm patch with standing tylenol. Talked with patient about chronic nature of pain and trial for optimization  Diarrhea- could be a mix from recent start of antibiotics and being on stool softener. Will hold stool softener for now and change to prn. Will continue florastor if blood noted in stool or has abdominal cramps, fever or any signs of infection will need to rule out c.diff  uti- complete course of antibiotics for now- keflex, encouraged hydration  Loss of appetite- her recent infection and depression both could be contributing to this. Complete antibiotic course, encourage po intake and increase remeron to 12.5 mg daily  Generalized weakness- encouraged to be out of bed to chair, working with PT at present  Temporal arteritis- continue chronic prednsione, has been decreased from 3 mg to 1 mg daily for now  HTN- continue current regimen of b blocker and monitor for now

## 2012-12-29 ENCOUNTER — Telehealth (HOSPITAL_COMMUNITY): Payer: Self-pay | Admitting: Emergency Medicine

## 2013-01-13 ENCOUNTER — Non-Acute Institutional Stay (SKILLED_NURSING_FACILITY): Payer: Medicare Other | Admitting: Nurse Practitioner

## 2013-01-13 ENCOUNTER — Encounter: Payer: Self-pay | Admitting: Nurse Practitioner

## 2013-01-13 DIAGNOSIS — R1110 Vomiting, unspecified: Secondary | ICD-10-CM

## 2013-01-13 DIAGNOSIS — R509 Fever, unspecified: Secondary | ICD-10-CM

## 2013-01-13 NOTE — Progress Notes (Signed)
  Subjective:    Patient ID: Erica French, female    DOB: 12-12-1929, 79 y.o.   MRN: [id number]  HPI Comments: She had been treated for UTI earlier this month.  Fever  This is a new problem. The current episode started today. The maximum temperature noted was 101 to 101.9 F. Associated symptoms include nausea, sleepiness and vomiting. Pertinent negatives include no wheezing. She has tried acetaminophen (She was given Tylenol 650mg  and phenergan 25mg .) for the symptoms. The treatment provided mild relief.  Emesis  This is a new problem. The current episode started today. The problem occurs intermittently. The maximum temperature recorded prior to her arrival was 101 - 101.9 F. The fever has been present for less than 1 day. Associated symptoms include arthralgias, a fever and sweats. She has tried increased fluids for the symptoms. The treatment provided mild relief.      Review of Systems  Constitutional: Positive for fever, diaphoresis, activity change, appetite change and fatigue.  HENT: Negative.   Eyes: Negative.   Respiratory: Negative.  Negative for shortness of breath and wheezing.   Cardiovascular: Negative.   Gastrointestinal: Positive for nausea, vomiting and constipation.  Endocrine: Negative.   Genitourinary: Negative.   Musculoskeletal: Positive for arthralgias.  Skin: Positive for pallor.  Neurological: Negative.   Hematological: Negative.   Psychiatric/Behavioral: Negative.        Objective:   Physical Exam  Constitutional: She appears listless. She is easily aroused. She appears ill.  She is in bed asleep, breakfast tray is in front of her untouched.  Eyes: Pupils are equal, round, and reactive to light.  Cardiovascular: Normal rate and regular rhythm.   Pulmonary/Chest: Effort normal and breath sounds normal. No respiratory distress. She has no wheezes.  Abdominal: She exhibits distension.  Neurological: She is easily aroused. She appears listless.  Skin:  Skin is intact. She is diaphoretic. There is pallor.  Psychiatric: She has a normal mood and affect.          Assessment & Plan:  Pt with fever of 101.4, was given Tylenol with resultant temp of 98.6, then back up to 99.4. 1) will draw cbc and bmp today. 2) will obtain cxray due to fever. 3) Will obtain ua C&S today to r/o uti. 4) Pt has not had bm since 01/08/13. On Miralax and Bisacodyl. Give fleets enema pr x 1 today.

## 2013-01-14 ENCOUNTER — Encounter: Payer: Self-pay | Admitting: Nurse Practitioner

## 2013-02-04 ENCOUNTER — Other Ambulatory Visit: Payer: Self-pay | Admitting: *Deleted

## 2013-02-04 MED ORDER — FENTANYL 25 MCG/HR TD PT72: MEDICATED_PATCH | TRANSDERMAL | Status: DC

## 2013-02-05 ENCOUNTER — Non-Acute Institutional Stay (SKILLED_NURSING_FACILITY): Payer: Medicare Other | Admitting: Internal Medicine

## 2013-02-05 DIAGNOSIS — R21 Rash and other nonspecific skin eruption: Secondary | ICD-10-CM

## 2013-02-05 DIAGNOSIS — F32A Depression, unspecified: Secondary | ICD-10-CM

## 2013-02-05 DIAGNOSIS — F3289 Other specified depressive episodes: Secondary | ICD-10-CM

## 2013-02-05 DIAGNOSIS — F329 Major depressive disorder, single episode, unspecified: Secondary | ICD-10-CM

## 2013-02-05 NOTE — Progress Notes (Signed)
Patient ID: Erica French, female   DOB: 1930/08/04, 79 y.o.   MRN: [id number]  CC- rash  Code status- DNR  HPI- 79 y/o female patient is a LTC resident here and was seen today with complaints of redness noted on her sternal area this am. She was recently noted to have rash on her back, arm, legs and trunk and has been on lidex cream since 01/30/13. The rash has slightly improved but this am staff noticed a deeper red color discoloration in sternal area. Pt denies any pain or itching associated with this. No new medication used. Lidex cream has been applied in the sternal area as well by CNA this am.  Her mood appears better than last visit  Review of Systems  Constitutional: Positive for malaise/fatigue. Negative for fever, chills and diaphoresis.  HENT: Negative for ear pain and congestion.   Eyes: Negative for blurred vision.  Respiratory: Negative for cough and sputum production.   Cardiovascular: Negative for chest pain, palpitations and leg swelling.  Gastrointestinal: No abdominal pain, nausea or vomiting. Negative for heartburn. Genitourinary: Negative for dysuria.  Musculoskeletal: Positive for back pain. Negative for falls.  Skin: Negative for rash.  Neurological: Negative for dizziness, focal weakness, weakness and headaches.  Psychiatric/Behavioral: Positive for depression. Negative for suicidal ideas and hallucinations. The patient is not nervous/anxious and does not have insomnia.  Allergies  Allergen Reactions  . Prozac (Fluoxetine Hcl) Anaphylaxis, Hives, Itching, Swelling and Rash  . Codeine Itching and Rash   Past Medical History  Diagnosis Date  . COPD (chronic obstructive pulmonary disease)   . Anxiety   . Hypertension   . Depression   . Thyroid nodule   . Headache     temple artiritis  . Stroke     left sided weakness- transfers with walke rand assiast  . Pneumonia 05/2012  . H/O arteritis     Temporal  . Gastritis    Medication reviewed  VSS,  afebrile  Physical Exam  Constitutional: She is oriented to person, place, and time. She appears well-developed and well-nourished. No distress.  HENT:   Head: Normocephalic and atraumatic.   Mouth/Throat: Oropharynx is clear and moist.  Eyes: Conjunctivae are normal. Pupils are equal, round, and reactive to light.  Neck: Normal range of motion. Neck supple. No JVD present.  Cardiovascular: Normal rate and regular rhythm.   Pulmonary/Chest: Effort normal and breath sounds normal. No respiratory distress.  Abdominal: Soft. Bowel sounds are normal. She exhibits no distension.  Musculoskeletal: Normal range of motion. She exhibits no edema and no tenderness.  Low back pain  Lymphadenopathy:   She has no cervical adenopathy.  Neurological: She is alert and oriented to person, place, and time. No cranial nerve deficit.  Skin: Skin is warm and dry. She is not diaphoretic. Has macular rash in back, leg, trunk and chest area and erythematous macule on the centre of sternum. Non tender, no drainage Psychiatric: She has a normal mood and affect.   ASSESSMENT/PLAN-  Rash- has generalized erythematous macules and one hyperpigmented macules in central sternum. Unclear of the etiology. No new medication. Lidex- it could have been applied roughly on the sternal area causing the lesion. Will d/c lidex for now and have her on po prednisone 50 mg x 5 days and triamcinolone cream to be applied to the rash area. Reassess if no improvement. Given the distribution of lesion dermatomyositis is another possibility.  Depression- continue cymbalta and remeron

## 2013-03-04 ENCOUNTER — Other Ambulatory Visit: Payer: Self-pay | Admitting: Geriatric Medicine

## 2013-03-04 MED ORDER — FENTANYL 25 MCG/HR TD PT72: MEDICATED_PATCH | TRANSDERMAL | Status: DC

## 2013-04-11 ENCOUNTER — Other Ambulatory Visit: Payer: Self-pay

## 2013-04-11 LAB — URINALYSIS, COMPLETE
Glucose,UR: NEGATIVE mg/dL (ref 0–75)
Ketone: NEGATIVE
Leukocyte Esterase: NEGATIVE
Ph: 8 (ref 4.5–8.0)
WBC UR: 3 /HPF (ref 0–5)

## 2013-04-13 ENCOUNTER — Non-Acute Institutional Stay (SKILLED_NURSING_FACILITY): Payer: Medicare Other | Admitting: Adult Health

## 2013-04-13 DIAGNOSIS — G8929 Other chronic pain: Secondary | ICD-10-CM

## 2013-04-13 DIAGNOSIS — M545 Low back pain, unspecified: Secondary | ICD-10-CM

## 2013-04-13 DIAGNOSIS — N390 Urinary tract infection, site not specified: Secondary | ICD-10-CM

## 2013-04-13 DIAGNOSIS — F32A Depression, unspecified: Secondary | ICD-10-CM

## 2013-04-13 DIAGNOSIS — F329 Major depressive disorder, single episode, unspecified: Secondary | ICD-10-CM

## 2013-04-13 DIAGNOSIS — K5900 Constipation, unspecified: Secondary | ICD-10-CM

## 2013-04-13 DIAGNOSIS — F3289 Other specified depressive episodes: Secondary | ICD-10-CM

## 2013-04-13 DIAGNOSIS — I10 Essential (primary) hypertension: Secondary | ICD-10-CM

## 2013-04-13 LAB — URINE CULTURE

## 2013-04-20 ENCOUNTER — Encounter: Payer: Self-pay | Admitting: Internal Medicine

## 2013-04-24 ENCOUNTER — Other Ambulatory Visit: Payer: Self-pay

## 2013-04-24 ENCOUNTER — Encounter: Payer: Self-pay | Admitting: Adult Health

## 2013-04-24 DIAGNOSIS — N390 Urinary tract infection, site not specified: Secondary | ICD-10-CM | POA: Insufficient documentation

## 2013-04-24 DIAGNOSIS — I10 Essential (primary) hypertension: Secondary | ICD-10-CM | POA: Insufficient documentation

## 2013-04-24 NOTE — Assessment & Plan Note (Signed)
Her blood pressure is stable will continue coreg 1.562 mg twice daily and will monitor

## 2013-04-24 NOTE — Assessment & Plan Note (Addendum)
Her pain is being managed; will continue tylenol 650 mg twice daily; duragesic 25 mcg patch every 3 days; lidoderm patch to her lower back and take cymbatla 60 mg daily and will monitor her status

## 2013-04-24 NOTE — Assessment & Plan Note (Signed)
Will continue miralax daily and will monitor 

## 2013-04-24 NOTE — Assessment & Plan Note (Signed)
For her uti will being macrobid 100 mg twice daily for 10 days with florastor twice daily for 10 days and will monitor her status

## 2013-04-24 NOTE — Assessment & Plan Note (Addendum)
She is presently stable will continue cymbalta 60 mg daily and remeron 15 mg nightly and will monitor her status

## 2013-04-24 NOTE — Progress Notes (Signed)
Patient ID: Erica French, female   DOB: Jan 17, 1930, 79 y.o.   MRN: [id number]  [facility]  Allergies  Allergen Reactions  . Prozac (Fluoxetine Hcl) Anaphylaxis, Hives, Itching, Swelling and Rash  . Codeine Itching and Rash     Chief Complaint  Patient presents with  . Medical Managment of Chronic Issues    HPI: She is being seen for the management of chronic illnesses. She has had a urin culture done due to dysuria which did grow gram + cocci. The uti will be treated wiht macrobid. She is voicing no other complaints at this time. She did have a rash yesterday on her face; but today is without any sings of rash present.   Past Medical History  Diagnosis Date  . COPD (chronic obstructive pulmonary disease)   . Anxiety   . Hypertension   . Depression   . Thyroid nodule   . Headache(784.0)     temple artiritis  . Stroke     left sided weakness- transfers with walke rand assiast  . Pneumonia 05/2012  . H/O arteritis     Temporal  . Gastritis     Past Surgical History  Procedure Laterality Date  . Hernia repair    . Cholecystectomy    . Thyroid surgery    . Esophagogastroduodenoscopy  06/08/2012    Procedure: ESOPHAGOGASTRODUODENOSCOPY (EGD);  Surgeon: Florencia Reasons, MD;  Location: [facility] ENDOSCOPY;  Service: Endoscopy;  Laterality: N/A;  . Appendectomy    . Oophorectomy    . Kyphoplasty  2012  . Kyphoplasty N/A 11/24/2012    Procedure: KYPHOPLASTY;  Surgeon: Barnett Abu, MD;  Location: [facility] NEURO ORS;  Service: Neurosurgery;  Laterality: N/A;  Thoracic Five Thoracic Six and Thoracic Seven Acrylic Balloon Kyphoplasty    VITAL SIGNS BP 134/80  Pulse 70  Ht 5\' 6"  (1.676 m)  Wt 106 lb 11.2 oz (48.399 kg)  BMI 17.23 kg/m2   Patient's Medications  New Prescriptions   No medications on file  Previous Medications   ACETAMINOPHEN (TYLENOL) 325 MG TABLET    Take 650 mg by mouth 2 (two) times daily.   CALCIUM-VITAMIN D (OSCAL WITH D) 500-200 MG-UNIT PER TABLET    Take 1  tablet by mouth 2 (two) times daily.   CARVEDILOL (COREG) 3.125 MG TABLET    Take 1.562 mg by mouth daily. Take 1/2 of 3.125mg  = 1.562mg  po qd.   CHOLECALCIFEROL (VITAMIN D) 1000 UNITS TABLET    Take 2,000 Units by mouth daily.   CLOPIDOGREL (PLAVIX) 75 MG TABLET    Take 75 mg by mouth daily.   DULOXETINE (CYMBALTA) 20 MG CAPSULE    Take 60 mg by mouth daily.    FENTANYL (DURAGESIC) 25 MCG/HR    Apply one patch every 72 hours.*remove old patch*rotate site*   LIDOCAINE (LIDODERM) 5 %    Place 1 patch onto the skin daily. Remove & Discard patch within 12 hours  To lower back   MAGNESIUM OXIDE (MAG-OX) 400 MG TABLET    Take 400 mg by mouth daily.   PANTOPRAZOLE (PROTONIX) 40 MG TABLET    Take 1 tablet (40 mg total) by mouth 2 (two) times daily.   POLYETHYLENE GLYCOL POWDER (GLYCOLAX/MIRALAX) POWDER    Take 17 g by mouth daily.   PREDNISONE (DELTASONE) 1 MG TABLET    Take 1 tablet (1 mg total) by mouth daily.  Modified Medications   Modified Medication Previous Medication   MIRTAZAPINE (REMERON) 7.5 MG TABLET mirtazapine (REMERON)  7.5 MG tablet      Take 15 mg by mouth at bedtime.    Take 1 tablet (7.5 mg total) by mouth at bedtime.  Discontinued Medications   BISACODYL (DULCOLAX) 5 MG EC TABLET    Take 5 mg by mouth daily. Take every day per MAR   CITALOPRAM (CELEXA) 20 MG TABLET    Take 20 mg by mouth daily.   DIAZEPAM (VALIUM) 2 MG TABLET    Take 1/2 tablet every night at bedtime as needed for pain/anxiety/spasm   FLUTICASONE-SALMETEROL (ADVAIR) 250-50 MCG/DOSE AEPB    Inhale 1 puff into the lungs at bedtime. For COPD   ONDANSETRON (ZOFRAN) 4 MG TABLET    Take 1 tablet (4 mg total) by mouth every 6 (six) hours.    SIGNIFICANT DIAGNOSTIC EXAMS    LABS REVIEWED;   12-25-12: wbc 9.8; hgb 11.6; hct 34.6 ;mcv 87.2 plt 374; glucose 104; bun 16; creat0.89; k+ 4.3; na++138 liver normal albumin 2.9 01-13-13: wbc 11.4; hgb 9.7; hct 29.4; mcv 88.3; plt 374; glucose 98; bun 11; creat 0.76; k+4.4 Na++  137; tsh 1.186 01-24-13; urine culture: no growth 04-13-13: urine culture: gram + cocci: macrobid     Review of Systems  Constitutional: Negative for malaise/fatigue.  Respiratory: Negative for cough and shortness of breath.   Cardiovascular: Negative for chest pain and palpitations.  Genitourinary: Positive for dysuria.  Musculoskeletal: Negative for myalgias and joint pain.  Skin: Negative.   Neurological: Negative for headaches.  Psychiatric/Behavioral: Negative for depression. The patient does not have insomnia.       Physical Exam  Constitutional: She is oriented to person, place, and time.  frail  Neck: Neck supple. No JVD present. No thyromegaly present.  Cardiovascular: Normal rate, regular rhythm and intact distal pulses.   Respiratory: Effort normal and breath sounds normal. No respiratory distress.  GI: Soft. Bowel sounds are normal. She exhibits no distension. There is no tenderness.  Musculoskeletal: Normal range of motion. She exhibits no edema.  Neurological: She is alert and oriented to person, place, and time.  Skin: Skin is warm and dry.  Psychiatric: She has a normal mood and affect.       ASSESSMENT/ PLAN:   Depression She is presently stable will continue cymbalta 60 mg daily and remeron 15 mg nightly and will monitor her status  Unspecified constipation Will continue miralax daily and will monitor  Chronic lower back pain Her pain is being managed; will continue tylenol 650 mg twice daily; duragesic 25 mcg patch every 3 days; lidoderm patch to her lower back and take cymbatla 60 mg daily and will monitor her status   Essential hypertension, benign Her blood pressure is stable will continue coreg 1.562 mg twice daily and will monitor  UTI (lower urinary tract infection) For her uti will being macrobid 100 mg twice daily for 10 days with florastor twice daily for 10 days and will monitor her status    Time spent with patient 50 minutes.

## 2013-04-25 ENCOUNTER — Other Ambulatory Visit: Payer: Self-pay

## 2013-04-25 LAB — URINALYSIS, COMPLETE
Bacteria: NONE SEEN
Bilirubin,UR: NEGATIVE
Blood: NEGATIVE
Leukocyte Esterase: NEGATIVE
Nitrite: NEGATIVE
Ph: 6 (ref 4.5–8.0)
Protein: NEGATIVE
RBC,UR: 1 /HPF (ref 0–5)
Specific Gravity: 1.018 (ref 1.003–1.030)
Squamous Epithelial: <1
WBC UR: 1 /HPF (ref 0–5)

## 2013-04-26 ENCOUNTER — Non-Acute Institutional Stay (SKILLED_NURSING_FACILITY): Payer: PRIVATE HEALTH INSURANCE | Admitting: Internal Medicine

## 2013-04-26 DIAGNOSIS — Z8673 Personal history of transient ischemic attack (TIA), and cerebral infarction without residual deficits: Secondary | ICD-10-CM

## 2013-04-26 DIAGNOSIS — J189 Pneumonia, unspecified organism: Secondary | ICD-10-CM

## 2013-04-26 DIAGNOSIS — M1990 Unspecified osteoarthritis, unspecified site: Secondary | ICD-10-CM

## 2013-04-26 DIAGNOSIS — R21 Rash and other nonspecific skin eruption: Secondary | ICD-10-CM

## 2013-04-26 DIAGNOSIS — M316 Other giant cell arteritis: Secondary | ICD-10-CM

## 2013-04-26 DIAGNOSIS — I10 Essential (primary) hypertension: Secondary | ICD-10-CM

## 2013-04-26 NOTE — Progress Notes (Signed)
Patient ID: Erica French, female   DOB: 02-28-1930, 79 y.o.   MRN: [id number]   [facility]  Code Status: full code  Allergies  Allergen Reactions  . Prozac (Fluoxetine Hcl) Anaphylaxis, Hives, Itching, Swelling and Rash  . Codeine Itching and Rash   Chief Complaint  Patient presents with  . Medical Managment of Chronic Issues    HPI:  79 y/o female patient is here for long term care. She has had rash on her face , chest, arms and leg for a week. Lab work were sent. A trial of prednisone was given but she had confusion and it was stopped. She was found to have elevated blood count and levaquin was started after sending u/a and cxr. Her cxr has resulted showing pneumonia and repeat labs of today have showing improving white blood cell count. Her rash persists. No itching or pain in rash area She does not have an appetite. Denies any other complaints  Review of Systems:  No fever or chills No nausea or vomiting No abdominal pain No focal weakness  Past Medical History  Diagnosis Date  . COPD (chronic obstructive pulmonary disease)   . Anxiety   . Hypertension   . Depression   . Thyroid nodule   . Headache(784.0)     temple artiritis  . Stroke     left sided weakness- transfers with walke rand assiast  . Pneumonia 05/2012  . H/O arteritis     Temporal  . Gastritis    Past Surgical History  Procedure Laterality Date  . Hernia repair    . Cholecystectomy    . Thyroid surgery    . Esophagogastroduodenoscopy  06/08/2012    Procedure: ESOPHAGOGASTRODUODENOSCOPY (EGD);  Surgeon: Florencia Reasons, MD;  Location: [facility] ENDOSCOPY;  Service: Endoscopy;  Laterality: N/A;  . Appendectomy    . Oophorectomy    . Kyphoplasty  2012  . Kyphoplasty N/A 11/24/2012    Procedure: KYPHOPLASTY;  Surgeon: Barnett Abu, MD;  Location: [facility] NEURO ORS;  Service: Neurosurgery;  Laterality: N/A;  Thoracic Five Thoracic Six and Thoracic Seven Acrylic Balloon Kyphoplasty   Social  History:   reports that she quit smoking about 4 years ago. She does not have any smokeless tobacco history on file. She reports that she does not drink alcohol or use illicit drugs.  No family history on file.  Medications: Patient's Medications  New Prescriptions   No medications on file  Previous Medications   ACETAMINOPHEN (TYLENOL) 325 MG TABLET    Take 650 mg by mouth 2 (two) times daily.   CALCIUM-VITAMIN D (OSCAL WITH D) 500-200 MG-UNIT PER TABLET    Take 1 tablet by mouth 2 (two) times daily.   CARVEDILOL (COREG) 3.125 MG TABLET    Take 1.562 mg by mouth daily. Take 1/2 of 3.125mg  = 1.562mg  po qd.   CHOLECALCIFEROL (VITAMIN D) 1000 UNITS TABLET    Take 2,000 Units by mouth daily.   CLOPIDOGREL (PLAVIX) 75 MG TABLET    Take 75 mg by mouth daily.   DULOXETINE (CYMBALTA) 20 MG CAPSULE    Take 60 mg by mouth daily.    FENTANYL (DURAGESIC) 25 MCG/HR    Apply one patch every 72 hours.*remove old patch*rotate site*   LIDOCAINE (LIDODERM) 5 %    Place 1 patch onto the skin daily. Remove & Discard patch within 12 hours  To lower back   MAGNESIUM OXIDE (MAG-OX) 400 MG TABLET    Take 400 mg by  mouth daily.   MIRTAZAPINE (REMERON) 7.5 MG TABLET    Take 15 mg by mouth at bedtime.   PANTOPRAZOLE (PROTONIX) 40 MG TABLET    Take 1 tablet (40 mg total) by mouth 2 (two) times daily.   POLYETHYLENE GLYCOL POWDER (GLYCOLAX/MIRALAX) POWDER    Take 17 g by mouth daily.   PREDNISONE (DELTASONE) 1 MG TABLET    Take 1 tablet (1 mg total) by mouth daily.  Modified Medications   No medications on file  Discontinued Medications   No medications on file     Physical Exam: Filed Vitals:   04/26/13 1317  BP: 150/80  Pulse: 68  Temp: 97.3 F (36.3 C)  Resp: 16  SpO2: 98%   Physical Exam  Constitutional: She is oriented to person, place, and time. She appears well-developed and well-nourished. No distress.  HENT:  Head: Normocephalic and atraumatic.  Mouth/Throat: Oropharynx is clear and moist.   Eyes: Pupils are equal, round, and reactive to light.  Neck: Normal range of motion. Neck supple. No JVD present. No tracheal deviation present.  Cardiovascular: Normal rate and regular rhythm.   Pulmonary/Chest: Effort normal and breath sounds normal. No respiratory distress. She has no wheezes.  Decreased air entry bibasilar   Abdominal: Soft. Bowel sounds are normal. She exhibits no distension and no mass.  Musculoskeletal: Normal range of motion. She exhibits no edema.  Left sided hemiparesis  Lymphadenopathy:    She has no cervical adenopathy.  Neurological: She is alert and oriented to person, place, and time.  Skin: Skin is warm and dry. Rash noted. She is not diaphoretic. There is erythema.  Macular scattered lesion on the skin including face, neck, chest, arms and legs  Psychiatric: She has a normal mood and affect. Her behavior is normal.     Labs reviewed: Basic Metabolic Panel:  Recent Labs  19/14/78 1436 06/06/12 0615  06/10/12 0530 10/14/12 1608 11/24/12 1334 12/25/12 1713  NA 143 144  < > 141 137 137 138  K 3.2* 4.6  < > 3.7 4.5 4.3 4.3  CL 107 113*  < > 103 101 102 104  CO2 26 23  < > 29 25 29 26   GLUCOSE 116* 83  < > 88 92 128* 104*  BUN 16 11  < > 10 12 16 16   CREATININE 0.74 0.62  < > 0.86 0.79 0.82 0.89  CALCIUM 8.8 7.8*  < > 9.7 9.4 9.3 9.6  MG  --  1.8  --  1.9  --   --   --   < > = values in this interval not displayed. Liver Function Tests:  Recent Labs  06/08/12 1117 06/09/12 0535 12/25/12 1713  AST 23 17 25   ALT 8 9 10   ALKPHOS 52 47 122*  BILITOT 0.3 0.3 0.4  PROT 6.7 6.0 6.9  ALBUMIN 3.0* 2.7* 2.9*    Recent Labs  06/05/12 1436 12/25/12 1713  LIPASE 15 24   No results found for this basename: AMMONIA,  in the last 8760 hours CBC:  Recent Labs  06/05/12 1436 06/06/12 0615  10/14/12 1608 11/24/12 1334 12/25/12 1713  WBC 10.1 9.0  < > 4.9 6.1 9.8  NEUTROABS 8.3* 7.9*  --  3.2  --   --   HGB 11.8* 10.7*  < > 10.8* 11.9*  11.6*  HCT 36.5 33.2*  < > 32.8* 35.6* 34.6*  MCV 90.3 90.0  < > 90.1 89.2 87.2  PLT 228 226  < >  216 211 374  < > = values in this interval not displayed. Cardiac Enzymes:  Recent Labs  06/07/12 1618 06/07/12 2227  TROPONINI <0.30 <0.30    04/23/13 na 138, k 4.2, bun 30.8, cr 0.8, fe 6, tibc 181, iron sat 4, ferritin 154, folic acid 11.4, b12 340 04/23/13 wbc 13.6, hb 9.4, hct 30.6, plt 281 04/26/13 bmp wnl, wbc 6.7, hb 10, hct 31.6 04/23/13 cxr- patchy interstital chnages medial LLL consistent with PNA   Assessment/Plan  Pneumonia Will complete her course of levaquin, monitor wbc and temp curve  Rash Unclear etiology at present. Has hx of temporal arteritis. concerns for vasculitis. Will check ESR to assess for inflammatory changes. Dermatology referral. Might need steroid and cytotoxic therapy  HTN Continue coreg and monitor bp  OA Stable at present, continue lidocaine patch  Depression Mood remains stable with cymbalta and remeron  GERD Continue protonix for now  Temporal arteritis Continue low dose prednisone for now  Osteopenia contineu ca-vit d supplement  Old cva bp remains stable. Continue plavix and b blocker for now  Family/ staff Communication: reviewed care plan with patient, nursing staff and PA

## 2013-04-28 LAB — WOUND CULTURE

## 2013-05-04 ENCOUNTER — Other Ambulatory Visit: Payer: Self-pay | Admitting: Geriatric Medicine

## 2013-05-04 MED ORDER — FENTANYL 25 MCG/HR TD PT72: MEDICATED_PATCH | TRANSDERMAL | Status: DC

## 2013-05-10 NOTE — Progress Notes (Signed)
This encounter was created in error - please disregard.

## 2013-05-22 ENCOUNTER — Other Ambulatory Visit: Payer: Self-pay

## 2013-05-22 LAB — URINALYSIS, COMPLETE
Glucose,UR: NEGATIVE mg/dL (ref 0–75)
Ketone: NEGATIVE
Nitrite: POSITIVE
Ph: 6 (ref 4.5–8.0)
Protein: 30
Specific Gravity: 1.016 (ref 1.003–1.030)
Squamous Epithelial: NONE SEEN

## 2013-05-24 LAB — URINE CULTURE

## 2013-06-04 ENCOUNTER — Other Ambulatory Visit: Payer: Self-pay | Admitting: *Deleted

## 2013-06-04 MED ORDER — FENTANYL 25 MCG/HR TD PT72: MEDICATED_PATCH | TRANSDERMAL | Status: DC

## 2013-06-16 ENCOUNTER — Encounter: Payer: Self-pay | Admitting: Internal Medicine

## 2013-07-05 ENCOUNTER — Other Ambulatory Visit: Payer: Self-pay | Admitting: *Deleted

## 2013-07-05 MED ORDER — FENTANYL 25 MCG/HR TD PT72: MEDICATED_PATCH | TRANSDERMAL | Status: DC

## 2013-07-05 NOTE — Telephone Encounter (Signed)
rx refilled per protocol  

## 2013-07-15 ENCOUNTER — Non-Acute Institutional Stay (SKILLED_NURSING_FACILITY): Payer: PRIVATE HEALTH INSURANCE | Admitting: Internal Medicine

## 2013-07-15 DIAGNOSIS — M545 Low back pain, unspecified: Secondary | ICD-10-CM

## 2013-07-15 DIAGNOSIS — I69993 Ataxia following unspecified cerebrovascular disease: Secondary | ICD-10-CM

## 2013-07-15 DIAGNOSIS — D509 Iron deficiency anemia, unspecified: Secondary | ICD-10-CM

## 2013-07-15 DIAGNOSIS — R52 Pain, unspecified: Secondary | ICD-10-CM

## 2013-07-15 DIAGNOSIS — F32A Depression, unspecified: Secondary | ICD-10-CM

## 2013-07-15 DIAGNOSIS — M316 Other giant cell arteritis: Secondary | ICD-10-CM

## 2013-07-15 DIAGNOSIS — G8929 Other chronic pain: Secondary | ICD-10-CM

## 2013-07-15 DIAGNOSIS — M810 Age-related osteoporosis without current pathological fracture: Secondary | ICD-10-CM

## 2013-07-15 DIAGNOSIS — F3289 Other specified depressive episodes: Secondary | ICD-10-CM

## 2013-07-15 DIAGNOSIS — F329 Major depressive disorder, single episode, unspecified: Secondary | ICD-10-CM

## 2013-07-15 DIAGNOSIS — I10 Essential (primary) hypertension: Secondary | ICD-10-CM

## 2013-07-15 NOTE — Progress Notes (Signed)
Patient ID: AMAR KEENUM, female   DOB: August 21, 1930, 79 y.o.   MRN: [id number]  [facility]  Code Status: full code  Chief complaint- medical management of chronic illness  Allergies reviewed  HPI:   79 y/o female patient is here for long term care. She is seen today for routine visit. She has worsening of her mid back pain. She mentions that her pain was better controlled while on oxycodone but she had broken into rash and this was stopped. Following this her rash resolved. Currently is on tyleonol and fentanyl patch without much help.  No other complaints No concern from staff  Review of Systems:  No fever or chills No nausea or vomiting No abdominal pain No focal weakness No falls reported No new skin concern No recent behavioral changes reported  Past Medical History  Diagnosis Date  . COPD (chronic obstructive pulmonary disease)   . Anxiety   . Hypertension   . Depression   . Thyroid nodule   . Headache(784.0)     temple artiritis  . Stroke     left sided weakness- transfers with walke rand assiast  . Pneumonia 05/2012  . H/O arteritis     Temporal  . Gastritis    Medication reviewed. See [facility] Past Surgical History  Procedure Laterality Date  . Hernia repair    . Cholecystectomy    . Thyroid surgery    . Esophagogastroduodenoscopy  06/08/2012    Procedure: ESOPHAGOGASTRODUODENOSCOPY (EGD);  Surgeon: Florencia Reasons, MD;  Location: [facility] ENDOSCOPY;  Service: Endoscopy;  Laterality: N/A;  . Appendectomy    . Oophorectomy    . Kyphoplasty  2012  . Kyphoplasty N/A 11/24/2012    Procedure: KYPHOPLASTY;  Surgeon: Barnett Abu, MD;  Location: [facility] NEURO ORS;  Service: Neurosurgery;  Laterality: N/A;  Thoracic Five Thoracic Six and Thoracic Seven Acrylic Balloon Kyphoplasty       Physical Exam   BP 153/76  Pulse 64  Temp(Src) 97.4 F (36.3 C)  Resp 14  Constitutional: She is oriented to person, place, and time. She appears well-developed and  well-nourished. No distress.  HENT:   Head: Normocephalic and atraumatic.   Mouth/Throat: Oropharynx is clear and moist.  Eyes: Pupils are equal, round, and reactive to light.  Neck: Normal range of motion. Neck supple. No JVD present. No tracheal deviation present.  Cardiovascular: Normal rate and regular rhythm.   Pulmonary/Chest: Effort normal and breath sounds normal. No respiratory distress. She has no wheezes.  Abdominal: Soft. Bowel sounds are normal. She exhibits no distension and no mass.  Musculoskeletal: Normal range of motion. She exhibits no edema. Tenderness in thoracic spine area Left sided hemiparesis. Has scoliosis and kyphosis and is s/p kyphoplasty in past Lymphadenopathy:  She has no cervical adenopathy.  Neurological: She is alert and oriented to person, place, and time.  Skin: Skin is warm and dry Psychiatric: She has a normal mood and affect. Her behavior is normal.   Labs reviewed-  04/23/13 na 138, k 4.2, bun 30.8, cr 0.8, fe 6, tibc 181, iron sat 4, ferritin 154, folic acid 11.4, b12 340 04/23/13 wbc 13.6, hb 9.4, hct 30.6, plt 281 04/26/13 bmp wnl, wbc 6.7, hb 10, hct 31.6 04/23/13 cxr- patchy interstital chnages medial LLL consistent with PNA 05/24/13 wbc 6.7, hb 9.7, hct 31, plt 266, na 138, k 4.5, bun 19, cr 0.7, glu 80, ca 8.9   Assessment/Plan  HTN SBP elevated at present. Heart rate is normal. She  is on a tiny dose of coreg which is clearly not a treatment dose. Will d/c coreg and start her on amlodipine 2.5 mg daily and monitor to adjust dose further  Chronic back pain Worsened. Recent increase in lidocaine patch to 37.5 has not provided relief. Also on tylenol 650 mg tid. Cannot use codeine related product with rash. Will try gabapentin 100 mg po tid for now and change tylenol to prn and reassess  Depression Mood remains stable at present. Continue celexa 20 mg daily   Osteopenia contineu ca-vit d supplement. Patient at increased risk of osteoporosis  but has bad gastritis and reflux disease. Will write for prolia injection 60 mg sq twice a year for now  GERD Symptoms under good control. With long term use of ppi increasing risk for osteoporosis in this frail lady with DJD and hx of kyphoplasty, will decrease protonix to 20 mg bid for now and moniotr  Temporal arteritis Continue low dose prednisone for now  Old cva bp remains stable. Continue plavix for now  Anemia Continue iron supplement and b12 supplement and monitor h/h   Spent more than 50 minutes with patient, formulating her care plan and reviewing the plan with patient and nursing supervisor

## 2013-07-31 ENCOUNTER — Other Ambulatory Visit: Payer: Self-pay

## 2013-07-31 LAB — COMPREHENSIVE METABOLIC PANEL
Albumin: 3 g/dL — ABNORMAL LOW (ref 3.4–5.0)
Alkaline Phosphatase: 113 U/L (ref 50–136)
Anion Gap: 4 — ABNORMAL LOW (ref 7–16)
BUN: 14 mg/dL (ref 7–18)
Chloride: 106 mmol/L (ref 98–107)
Co2: 30 mmol/L (ref 21–32)
Creatinine: 0.93 mg/dL (ref 0.60–1.30)
EGFR (African American): >60
Osmolality: 280 (ref 275–301)
Potassium: 4.5 mmol/L (ref 3.5–5.1)
Sodium: 140 mmol/L (ref 136–145)
Total Protein: 7.2 g/dL (ref 6.4–8.2)

## 2013-07-31 LAB — CBC WITH DIFFERENTIAL/PLATELET
Basophil #: 0 10*3/uL (ref 0.0–0.1)
Eosinophil #: 0.2 10*3/uL (ref 0.0–0.7)
Eosinophil %: 2.5 %
HCT: 31.5 % — ABNORMAL LOW (ref 35.0–47.0)
Lymphocyte #: 1.9 10*3/uL (ref 1.0–3.6)
Lymphocyte %: 28.3 %
MCV: 92 fL (ref 80–100)
Monocyte #: 0.8 x10 3/mm (ref 0.2–0.9)
Monocyte %: 12.5 %
Neutrophil #: 3.7 10*3/uL (ref 1.4–6.5)
Neutrophil %: 56.1 %
Platelet: 312 10*3/uL (ref 150–440)
RDW: 14.4 % (ref 11.5–14.5)
WBC: 6.6 10*3/uL (ref 3.6–11.0)

## 2013-08-03 ENCOUNTER — Other Ambulatory Visit: Payer: Self-pay | Admitting: *Deleted

## 2013-08-03 MED ORDER — FENTANYL 25 MCG/HR TD PT72: MEDICATED_PATCH | TRANSDERMAL | Status: DC

## 2013-08-03 MED ORDER — FENTANYL 12 MCG/HR TD PT72: 12.5000 ug | MEDICATED_PATCH | TRANSDERMAL | Status: DC

## 2013-08-23 ENCOUNTER — Non-Acute Institutional Stay (SKILLED_NURSING_FACILITY): Payer: PRIVATE HEALTH INSURANCE | Admitting: Internal Medicine

## 2013-08-23 DIAGNOSIS — F32A Depression, unspecified: Secondary | ICD-10-CM

## 2013-08-23 DIAGNOSIS — F329 Major depressive disorder, single episode, unspecified: Secondary | ICD-10-CM

## 2013-08-23 DIAGNOSIS — M8580 Other specified disorders of bone density and structure, unspecified site: Secondary | ICD-10-CM | POA: Insufficient documentation

## 2013-08-23 DIAGNOSIS — M899 Disorder of bone, unspecified: Secondary | ICD-10-CM

## 2013-08-23 DIAGNOSIS — M545 Low back pain, unspecified: Secondary | ICD-10-CM

## 2013-08-23 DIAGNOSIS — D509 Iron deficiency anemia, unspecified: Secondary | ICD-10-CM

## 2013-08-23 DIAGNOSIS — I69993 Ataxia following unspecified cerebrovascular disease: Secondary | ICD-10-CM

## 2013-08-23 DIAGNOSIS — K219 Gastro-esophageal reflux disease without esophagitis: Secondary | ICD-10-CM

## 2013-08-23 DIAGNOSIS — M316 Other giant cell arteritis: Secondary | ICD-10-CM

## 2013-08-23 DIAGNOSIS — G8929 Other chronic pain: Secondary | ICD-10-CM

## 2013-08-23 DIAGNOSIS — F3289 Other specified depressive episodes: Secondary | ICD-10-CM

## 2013-08-23 NOTE — Progress Notes (Signed)
Patient ID: Erica French, female   DOB: 08-Jan-1930, 79 y.o.   MRN: [id number]    [facility]  Code Status: full code  Chief complaint- medical management of chronic illness  Allergies reviewed  HPI:   79 y/o female patient is here for long term care. She is seen today for routine visit. Her pain is under control with current pain regimen. No other complaints. Completed tretament for pneumonia in November. Her iron dose is being tolerated well. No concern from staff. She is off all bp meds currently  Review of Systems:  No fever or chills No nausea or vomiting No abdominal pain No focal weakness No falls reported No new skin concern No recent behavioral changes reported  Past Medical History  Diagnosis Date  . COPD (chronic obstructive pulmonary disease)   . Anxiety   . Hypertension   . Depression   . Thyroid nodule   . Headache(784.0)     temple artiritis  . Stroke     left sided weakness- transfers with walke rand assiast  . Pneumonia 05/2012  . H/O arteritis     Temporal  . Gastritis    Past Surgical History  Procedure Laterality Date  . Hernia repair    . Cholecystectomy    . Thyroid surgery    . Esophagogastroduodenoscopy  06/08/2012    Procedure: ESOPHAGOGASTRODUODENOSCOPY (EGD);  Surgeon: Florencia Reasons, MD;  Location: [facility] ENDOSCOPY;  Service: Endoscopy;  Laterality: N/A;  . Appendectomy    . Oophorectomy    . Kyphoplasty  2012  . Kyphoplasty N/A 11/24/2012    Procedure: KYPHOPLASTY;  Surgeon: Barnett Abu, MD;  Location: [facility] NEURO ORS;  Service: Neurosurgery;  Laterality: N/A;  Thoracic Five Thoracic Six and Thoracic Seven Acrylic Balloon Kyphoplasty   Medication reviewed. See MAR  BP 124/56  Pulse 80  Temp(Src) 98.7 F (37.1 C)  Resp 18  SpO2 96%  Exam- Constitutional: She is oriented to person, place, and time. She appears well-developed and well-nourished. No distress.   HENT:   Head: Normocephalic and atraumatic.    Mouth/Throat: Oropharynx is clear and moist.   Eyes: Pupils are equal, round, and reactive to light.   Neck: Normal range of motion. Neck supple. No JVD present. No tracheal deviation present.   Cardiovascular: Normal rate and regular rhythm.    Pulmonary/Chest: Effort normal and breath sounds normal. No respiratory distress. She has no wheezes.   Abdominal: Soft. Bowel sounds are normal. She exhibits no distension and no mass.  Musculoskeletal: Normal range of motion. She exhibits no edema. Tenderness in thoracic spine area Left sided hemiparesis. Has scoliosis and kyphosis and is s/p kyphoplasty in past Lymphadenopathy:  She has no cervical adenopathy.  Neurological: She is alert and oriented to person, place, and time.   Skin: Skin is warm and dry Psychiatric: She has a normal mood and affect. Her behavior is normal.   Labs reviewed-  04/23/13 na 138, k 4.2, bun 30.8, cr 0.8, fe 6, tibc 181, iron sat 4, ferritin 154, folic acid 11.4, b12 340 04/23/13 wbc 13.6, hb 9.4, hct 30.6, plt 281 04/26/13 bmp wnl, wbc 6.7, hb 10, hct 31.6 04/23/13 cxr- patchy interstital chnages medial LLL consistent with PNA 05/24/13 wbc 6.7, hb 9.7, hct 31, plt 266, na 138, k 4.5, bun 19, cr 0.7, glu 80, ca 8.9 08/02/13 na 136, k 3.9, bun 13, cr 0.7, wbc 6.3, hb 9.3, hct 30.7, plt 343   Assessment/Plan  HTN bp well  controlled. Off all bp meds currently. Monitor bp  Chronic back pain Continue fentanyl patch 32.5 mg q72 h. Continue tylenol 650 mg tid prn and gabapentin 100 mg po tid  Osteopenia contineu ca-vit d supplement. Continue prolia injection 60 mg sq twice a year   GERD Symptoms under good control. Continue protonix 20 mg bid   Depression Mood remains stable at present. Continue celexa 20 mg daily    Temporal arteritis Continue low dose prednisone for now  Old cva bp remains stable. Continue plavix for now  Anemia Continue iron supplement and b12 supplement and monitor h/h

## 2013-09-02 ENCOUNTER — Other Ambulatory Visit: Payer: Self-pay | Admitting: *Deleted

## 2013-09-02 MED ORDER — FENTANYL 12 MCG/HR TD PT72: 12.5000 ug | MEDICATED_PATCH | TRANSDERMAL | Status: DC

## 2013-09-02 MED ORDER — FENTANYL 25 MCG/HR TD PT72: MEDICATED_PATCH | TRANSDERMAL | Status: DC

## 2013-09-03 IMAGING — CT CT ABD-PELV W/ CM
2 of 5 series · 16 of 46 positions shown, 18 images · IV contrast (APPLIED)
Comparison: 04/19/2011

CLINICAL DATA: Fever, abdominal pain and vomiting

CT ABDOMEN AND PELVIS WITH CONTRAST
TECHNIQUE: Multidetector CT imaging of the abdomen and pelvis was
performed following the standard protocol during bolus
administration of intravenous contrast.
Contrast: 100mL OMNIPAQUE IOHEXOL 300 MG/ML  SOLN

[Series 2: abd/pelv with 5.0 b31f st · axial · 0.77mm/px · z∈[+776,+1172]mm · 13 of 89 slices shown, 15 images]
[im 5/89  soft-tissue]
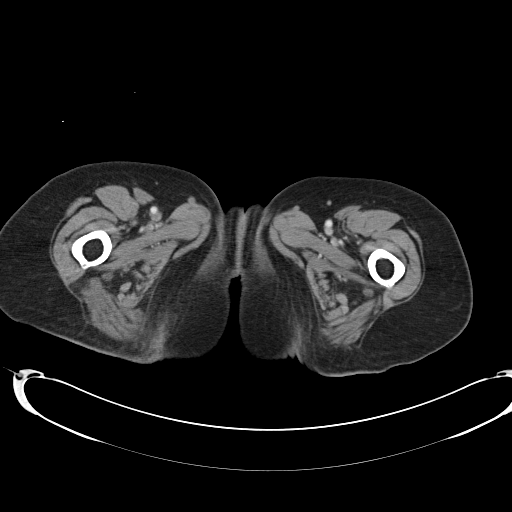
[im 5/89  bone]
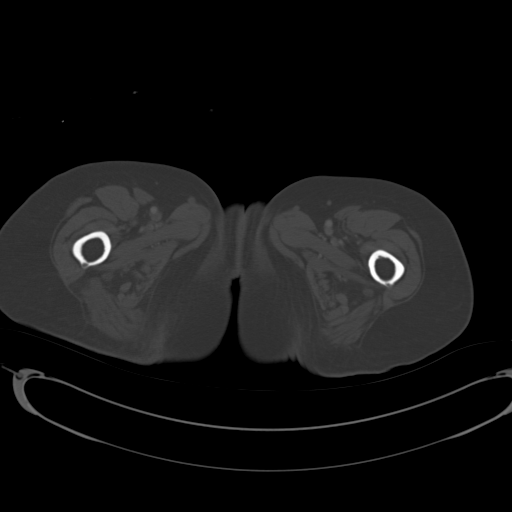
[im 14/89  soft-tissue]
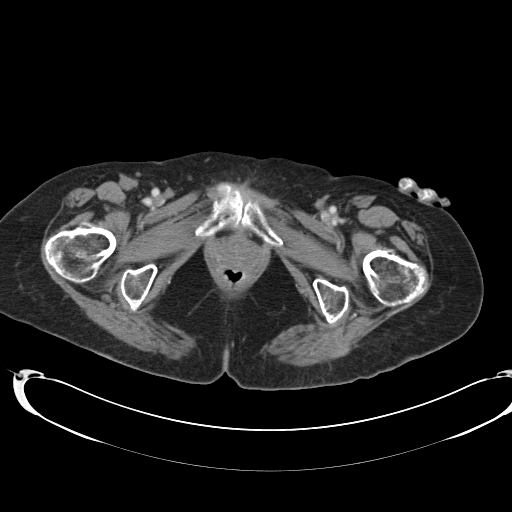
[im 18/89  soft-tissue]
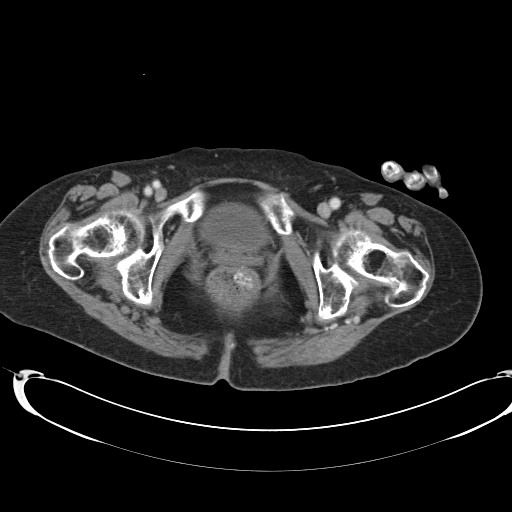
[im 27/89  soft-tissue]
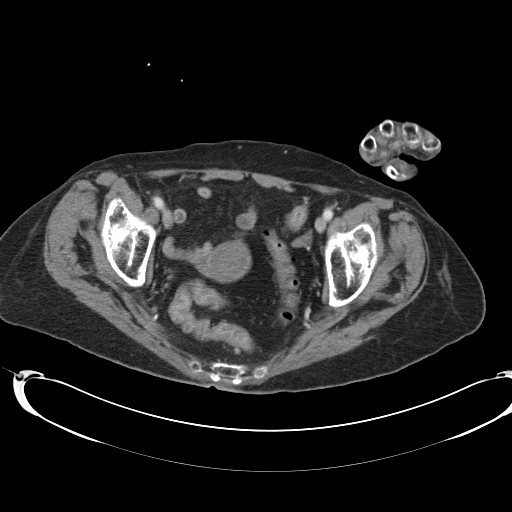
[im 31/89  soft-tissue]
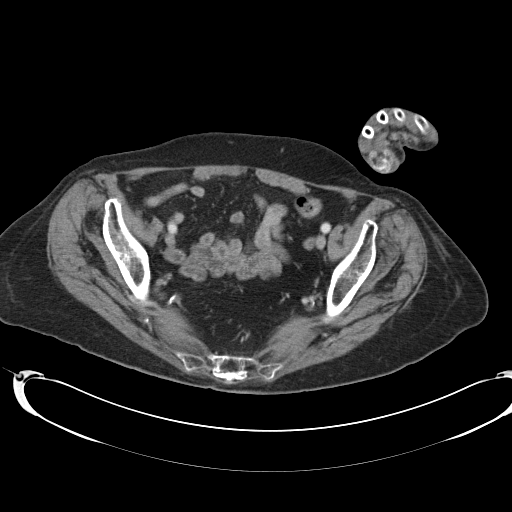
[im 40/89  soft-tissue]
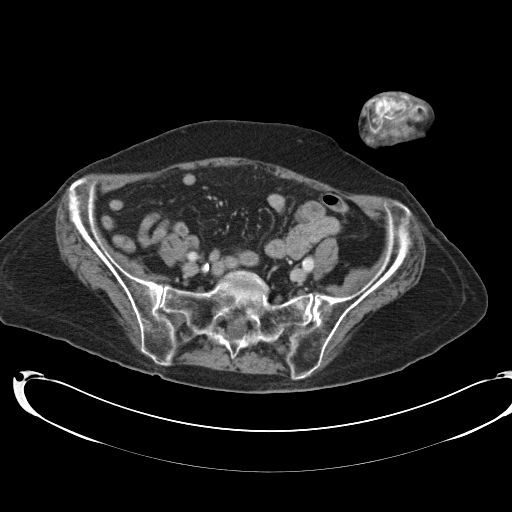
[im 45/89  soft-tissue]
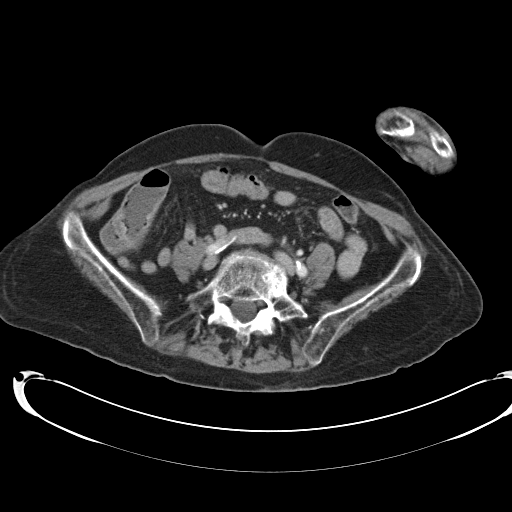
[im 49/89  soft-tissue]
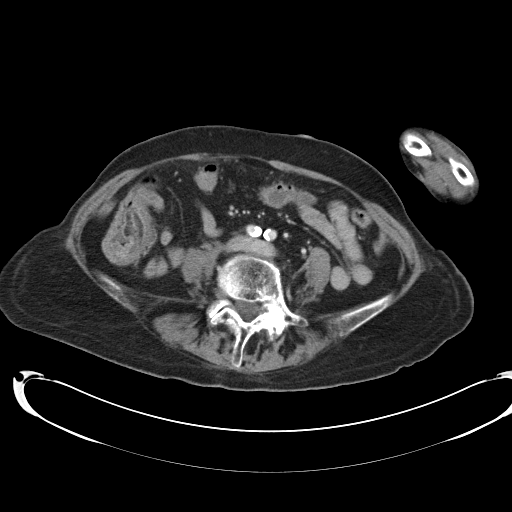
[im 58/89  soft-tissue]
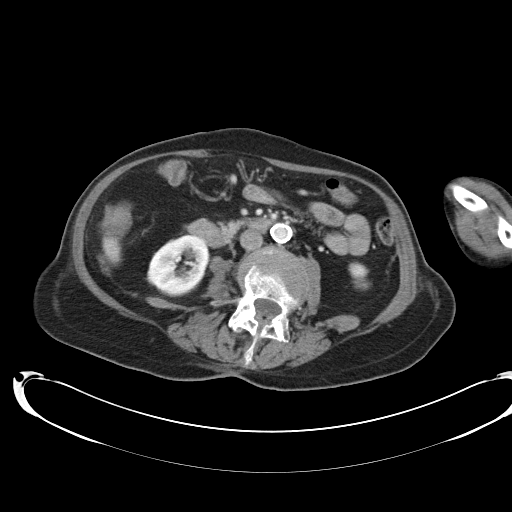
[im 58/89  bone]
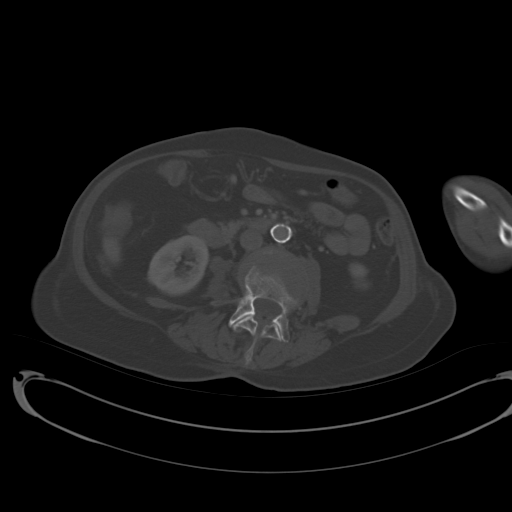
[im 62/89  soft-tissue]
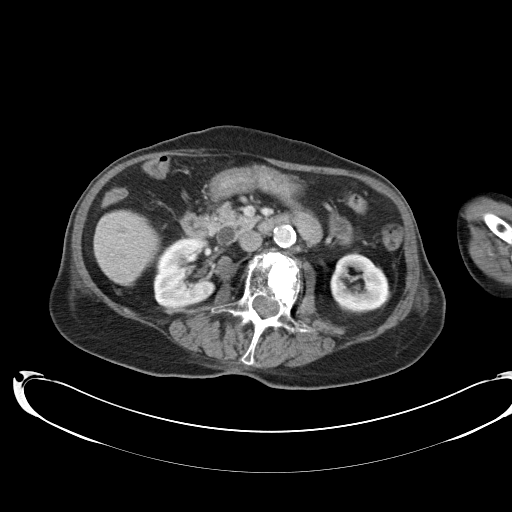
[im 71/89  soft-tissue]
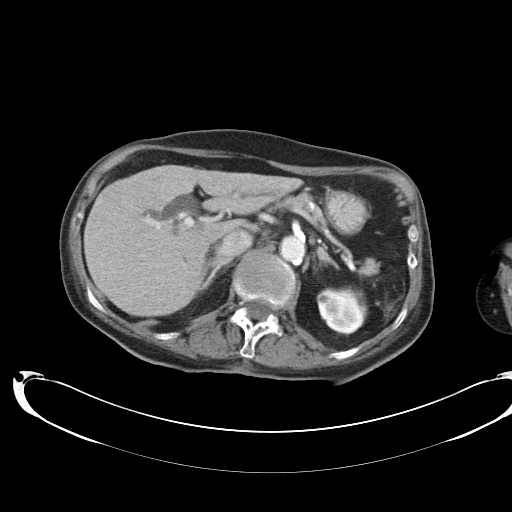
[im 75/89  soft-tissue]
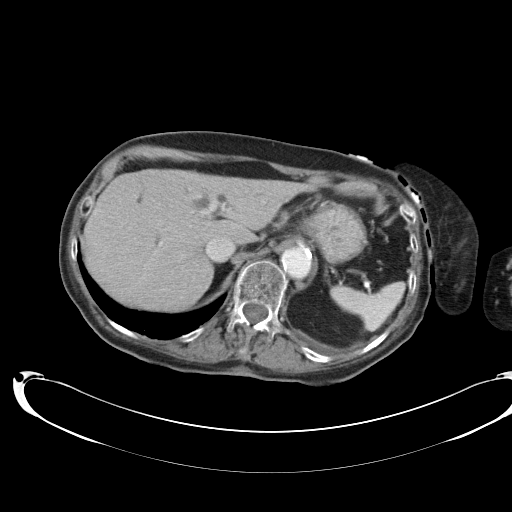
[im 84/89  soft-tissue]
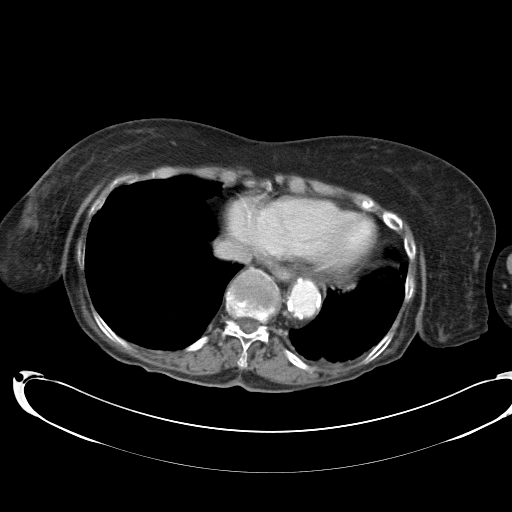

[Series 602: cor · coronal · 0.86mm/px · 3 of 90 slices shown]
[im 30/90  soft-tissue]
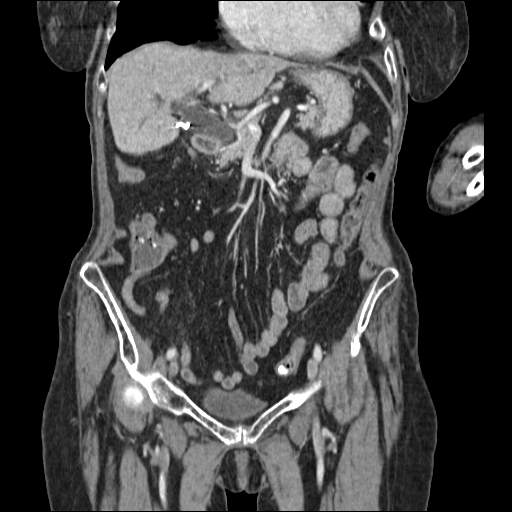
[im 40/90  soft-tissue]
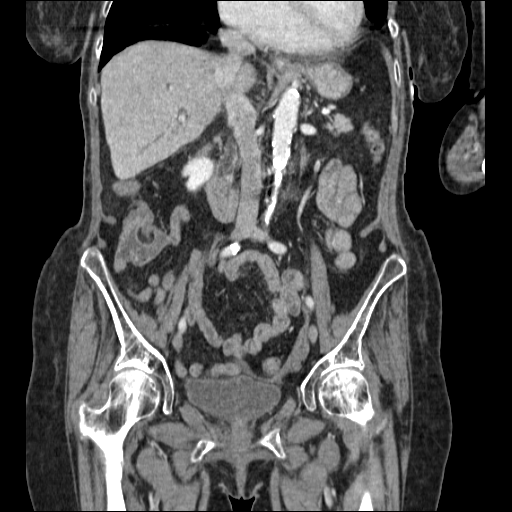
[im 50/90  soft-tissue]
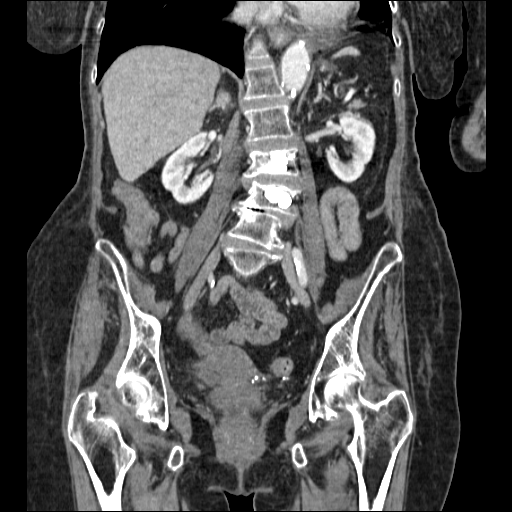

[16 of 46 positions shown; findings below may reference images not displayed]

FINDINGS: There is a small left pleural effusion.  Lung bases are
otherwise clear with changes of emphysema.

No focal liver abnormalities identified.  There is marked
dilatation of the common bile duct which measures up to 1.6 cm,
status post cholecystectomy.  Moderate intrahepatic bile duct
dilatation is identified.  On 04/02/2010 the CBD measured 1.4 cm.
No common bile duct stone or obstructing mass noted.  The pancreas
appears within normal limits.  The spleen is unremarkable.

Bilateral adrenal glands appear normal.  Multiple bilateral renal
cysts are identified. The kidneys are otherwise unremarkable.  No
obstructive uropathy.  The urinary bladder appears within normal
limits.  The uterus and the adnexal structures are unremarkable.

Borderline enlarged gastrohepatic ligament lymph node measures
cm, image 16.
No pelvic or inguinal adenopathy identified.  No free fluid or
fluid collections noted within the abdomen or pelvis.  Advanced
calcified atherosclerotic disease affects the abdominal aorta and
its branches.

The stomach appears within normal limits.  The small bowel loops
are normal no evidence for bowel obstruction.  Normal appearance of
the colon.  There is a scoliosis deformity involving the lumbar
spine which is convex to the right.  There are compression
deformities involving the HU and L3 vertebra. These have been
treated with bone cement.

No acute bony abnormalities noted.
IMPRESSION: 1.  Chronic marked dilatation of the common bile duct with moderate
intrahepatic bile duct dilatation.  No obstructing stone or mass
noted.
2.  Borderline enlarged gastrohepatic ligament lymph node.
3.  Small left pleural effusion.

## 2013-10-04 ENCOUNTER — Other Ambulatory Visit: Payer: Self-pay | Admitting: *Deleted

## 2013-10-04 ENCOUNTER — Non-Acute Institutional Stay (SKILLED_NURSING_FACILITY): Payer: PRIVATE HEALTH INSURANCE | Admitting: Internal Medicine

## 2013-10-04 ENCOUNTER — Encounter: Payer: Self-pay | Admitting: Internal Medicine

## 2013-10-04 DIAGNOSIS — G8929 Other chronic pain: Secondary | ICD-10-CM

## 2013-10-04 DIAGNOSIS — M899 Disorder of bone, unspecified: Secondary | ICD-10-CM

## 2013-10-04 DIAGNOSIS — M545 Low back pain, unspecified: Secondary | ICD-10-CM

## 2013-10-04 DIAGNOSIS — I69993 Ataxia following unspecified cerebrovascular disease: Secondary | ICD-10-CM

## 2013-10-04 DIAGNOSIS — F3289 Other specified depressive episodes: Secondary | ICD-10-CM

## 2013-10-04 DIAGNOSIS — F329 Major depressive disorder, single episode, unspecified: Secondary | ICD-10-CM

## 2013-10-04 DIAGNOSIS — D509 Iron deficiency anemia, unspecified: Secondary | ICD-10-CM

## 2013-10-04 DIAGNOSIS — M949 Disorder of cartilage, unspecified: Secondary | ICD-10-CM

## 2013-10-04 DIAGNOSIS — M316 Other giant cell arteritis: Secondary | ICD-10-CM

## 2013-10-04 DIAGNOSIS — M8580 Other specified disorders of bone density and structure, unspecified site: Secondary | ICD-10-CM

## 2013-10-04 DIAGNOSIS — G4700 Insomnia, unspecified: Secondary | ICD-10-CM | POA: Insufficient documentation

## 2013-10-04 DIAGNOSIS — F32A Depression, unspecified: Secondary | ICD-10-CM

## 2013-10-04 DIAGNOSIS — K219 Gastro-esophageal reflux disease without esophagitis: Secondary | ICD-10-CM

## 2013-10-04 DIAGNOSIS — R21 Rash and other nonspecific skin eruption: Secondary | ICD-10-CM

## 2013-10-04 MED ORDER — FENTANYL 25 MCG/HR TD PT72: MEDICATED_PATCH | TRANSDERMAL | Status: DC

## 2013-10-04 MED ORDER — FENTANYL 12 MCG/HR TD PT72: 12.5000 ug | MEDICATED_PATCH | TRANSDERMAL | Status: DC

## 2013-10-04 NOTE — Progress Notes (Signed)
Patient ID: Erica French, female   DOB: [birth year], 80 y.o.   MRN: [id number]    [facility]  Code Status: full code  Chief complaint- medical management of chronic illness  Allergies reviewed  HPI:   80 y/o female patient is here for long term care. She is seen today for routine visit. She recently underwent skin biopsy for her persistent rash with result pending after seeing dermatology. Her pain is under control with current pain regimen. No other complaints.   Review of Systems:  No fever or chills No nausea or vomiting No abdominal pain No focal weakness No falls reported No new skin concern No recent behavioral changes reported  Medication reviewed. See MAR   Exam- BP 123/66  Pulse 74  Temp(Src) 98.9 F (37.2 C)  Resp 18  SpO2 95%  Constitutional: She is oriented to person, place, and time. She appears well-developed and well-nourished. No distress.   HENT:   Head: Normocephalic and atraumatic.   Mouth/Throat: Oropharynx is clear and moist.   Eyes: Pupils are equal, round, and reactive to light.   Neck: Normal range of motion. Neck supple. No JVD present. No tracheal deviation present.   Cardiovascular: Normal rate and regular rhythm.    Pulmonary/Chest: Effort normal and breath sounds normal. No respiratory distress. She has no wheezes.  Abdominal: Soft. Bowel sounds are normal. She exhibits no distension and no mass.  Musculoskeletal: Normal range of motion. She exhibits no edema. Tenderness in thoracic spine area Left sided hemiparesis. Has scoliosis and kyphosis and is s/p kyphoplasty in past Lymphadenopathy:  She has no cervical adenopathy.  Neurological: She is alert and oriented to person, place, and time.   Skin: Skin is warm and dry. Erythematous rash-circular pattern on both thighs Psychiatric: She has a normal mood and affect. Her behavior is normal.   Labs reviewed-  04/23/13 na 138, k 4.2, bun 30.8, cr 0.8, fe 6, tibc 181, iron  sat 4, ferritin 154, folic acid 11.4, b12 340 04/23/13 wbc 13.6, hb 9.4, hct 30.6, plt 281 04/26/13 bmp wnl, wbc 6.7, hb 10, hct 31.6 04/23/13 cxr- patchy interstital chnages medial LLL consistent with PNA 05/24/13 wbc 6.7, hb 9.7, hct 31, plt 266, na 138, k 4.5, bun 19, cr 0.7, glu 80, ca 8.9 08/02/13 na 136, k 3.9, bun 13, cr 0.7, wbc 6.3, hb 9.3, hct 30.7, plt 343 09/27/13 na 142, k 4.1, glu 86, bun 23, cr 0.9, lft wnl, tg 185, rest of chol panel wnl, wbc 6.4, hb 13.4, plt 215, normal eosinophil count  Assessment/Plan  Chronic back pain Continue fentanyl patch 32.5 mg q72 h. Continue tylenol 650 mg tid prn and gabapentin 100 mg po tid  Temporal arteritis Continue low dose prednisone for now  Rash Unclear cause at present. Pending biopsy result.has hx of temporal arteritis, so this could be from another vasculitis or autoimmune problem  Anemia Continue iron supplement and b12 supplement and monitor h/h  GERD Symptoms under good control. Continue protonix 20 mg bid  Osteopenia contineu ca-vit d supplement. Continue prolia injection 60 mg sq twice a year   Depression Mood remains stable at present. Continue celexa 20 mg daily and remeron 15 mg daily for now. Tolerating it well    Old cva bp remains stable. Continue plavix for now  Insomnia Continue temazepam

## 2013-10-28 ENCOUNTER — Non-Acute Institutional Stay (SKILLED_NURSING_FACILITY): Payer: PRIVATE HEALTH INSURANCE | Admitting: Internal Medicine

## 2013-10-28 DIAGNOSIS — L309 Dermatitis, unspecified: Secondary | ICD-10-CM | POA: Insufficient documentation

## 2013-10-28 DIAGNOSIS — D509 Iron deficiency anemia, unspecified: Secondary | ICD-10-CM

## 2013-10-28 DIAGNOSIS — N3090 Cystitis, unspecified without hematuria: Secondary | ICD-10-CM

## 2013-10-28 DIAGNOSIS — K219 Gastro-esophageal reflux disease without esophagitis: Secondary | ICD-10-CM

## 2013-10-28 DIAGNOSIS — M810 Age-related osteoporosis without current pathological fracture: Secondary | ICD-10-CM

## 2013-10-28 DIAGNOSIS — F32A Depression, unspecified: Secondary | ICD-10-CM

## 2013-10-28 DIAGNOSIS — F3289 Other specified depressive episodes: Secondary | ICD-10-CM

## 2013-10-28 DIAGNOSIS — L259 Unspecified contact dermatitis, unspecified cause: Secondary | ICD-10-CM

## 2013-10-28 DIAGNOSIS — F329 Major depressive disorder, single episode, unspecified: Secondary | ICD-10-CM

## 2013-10-28 DIAGNOSIS — M316 Other giant cell arteritis: Secondary | ICD-10-CM

## 2013-10-28 NOTE — Progress Notes (Signed)
Patient ID: Erica French, female   DOB: 11-04-1929, 80 y.o.   MRN: [id number]   [facility]  Code Status: full code  Chief complaint- medical management of chronic illness  Allergies reviewed  HPI:   80 y/o female patient is here for long term care. She is seen today for routine visit. She has been started on plaquenil for concerns of lupus and dermatitis by dermatology. She complaints of dysuria and increased urinary frequency with lower abdominal pain x 2 days. No nausea or vomiting. Denies fever or chills. Her pain is under control with current pain regimen. No other complaints.   Review of Systems:  No fever or chills No nausea or vomiting No focal weakness No falls reported No new skin concern No recent behavioral changes reported  Past Medical History  Diagnosis Date  . COPD (chronic obstructive pulmonary disease)   . Anxiety   . Hypertension   . Depression   . Thyroid nodule   . Headache(784.0)     temple artiritis  . Stroke     left sided weakness- transfers with walke rand assiast  . Pneumonia 05/2012  . H/O arteritis     Temporal  . Gastritis    Past Surgical History  Procedure Laterality Date  . Hernia repair    . Cholecystectomy    . Thyroid surgery    . Esophagogastroduodenoscopy  06/08/2012    Procedure: ESOPHAGOGASTRODUODENOSCOPY (EGD);  Surgeon: Florencia Reasons, MD;  Location: [facility] ENDOSCOPY;  Service: Endoscopy;  Laterality: N/A;  . Appendectomy    . Oophorectomy    . Kyphoplasty  2012  . Kyphoplasty N/A 11/24/2012    Procedure: KYPHOPLASTY;  Surgeon: Barnett Abu, MD;  Location: [facility] NEURO ORS;  Service: Neurosurgery;  Laterality: N/A;  Thoracic Five Thoracic Six and Thoracic Seven Acrylic Balloon Kyphoplasty   No family history on file.   Medication reviewed. See [facility] Current Outpatient Prescriptions on File Prior to Visit  Medication Sig Dispense Refill  . acetaminophen (TYLENOL) 325 MG tablet Take 650 mg by mouth 2 (two) times  daily.      . calcium-vitamin D (OSCAL WITH D) 500-200 MG-UNIT per tablet Take 1 tablet by mouth 2 (two) times daily.      . cholecalciferol (VITAMIN D) 1000 UNITS tablet Take 2,000 Units by mouth daily.      . clopidogrel (PLAVIX) 75 MG tablet Take 75 mg by mouth daily.      . fentaNYL (DURAGESIC - DOSED MCG/HR) 12 MCG/HR Place 1 patch (12.5 mcg total) onto the skin every 3 (three) days.  10 patch  0  . magnesium oxide (MAG-OX) 400 MG tablet Take 400 mg by mouth daily.      . mirtazapine (REMERON) 7.5 MG tablet Take 15 mg by mouth at bedtime.      . polyethylene glycol powder (GLYCOLAX/MIRALAX) powder Take 17 g by mouth daily.  3350 g  1  . predniSONE (DELTASONE) 1 MG tablet Take 1 tablet (1 mg total) by mouth daily.  30 tablet  3   No current facility-administered medications on file prior to visit.     Medication List       This list is accurate as of: 10/28/13  6:47 PM.  Always use your most recent med list.               acetaminophen 325 MG tablet  Commonly known as:  TYLENOL  Take 650 mg by mouth 2 (two) times daily.  calcium-vitamin D 500-200 MG-UNIT per tablet  Commonly known as:  OSCAL WITH D  Take 1 tablet by mouth 2 (two) times daily.     cholecalciferol 1000 UNITS tablet  Commonly known as:  VITAMIN D  Take 2,000 Units by mouth daily.     citalopram 20 MG tablet  Commonly known as:  CELEXA  Take 20 mg by mouth daily.     clopidogrel 75 MG tablet  Commonly known as:  PLAVIX  Take 75 mg by mouth daily.     denosumab 60 MG/ML Soln injection  Commonly known as:  PROLIA  Inject 60 mg into the skin every 6 (six) months. Administer in upper arm, thigh, or abdomen     fentaNYL 25 MCG/HR patch  Commonly known as:  DURAGESIC - dosed mcg/hr  25 mcg. Apply one patch every 72 hours.*remove old patch*rotate site*     fentaNYL 12 MCG/HR  Commonly known as:  DURAGESIC - dosed mcg/hr  Place 1 patch (12.5 mcg total) onto the skin every 3 (three) days.     folic acid  0.5 MG tablet  Commonly known as:  FOLVITE  Take 1 mg by mouth daily.     gabapentin 100 MG capsule  Commonly known as:  NEURONTIN  Take 100 mg by mouth 3 (three) times daily.     hydroxychloroquine 200 MG tablet  Commonly known as:  PLAQUENIL  Take 200 mg by mouth 2 (two) times daily.     iron polysaccharides 150 MG capsule  Commonly known as:  NIFEREX  Take 150 mg by mouth 2 (two) times daily.     magnesium oxide 400 MG tablet  Commonly known as:  MAG-OX  Take 400 mg by mouth daily.     mirtazapine 7.5 MG tablet  Commonly known as:  REMERON  Take 15 mg by mouth at bedtime.     pantoprazole 40 MG tablet  Commonly known as:  PROTONIX  Take 20 mg by mouth 2 (two) times daily.     polyethylene glycol powder powder  Commonly known as:  GLYCOLAX/MIRALAX  Take 17 g by mouth daily.     predniSONE 1 MG tablet  Commonly known as:  DELTASONE  Take 1 tablet (1 mg total) by mouth daily.     vitamin B-12 1000 MCG tablet  Commonly known as:  CYANOCOBALAMIN  Take 1,000 mcg by mouth daily.        Exam- BP 123/66  Pulse 74  Temp(Src) 98.9 F (37.2 C)  Resp 18  SpO2 95%  Constitutional: She is oriented to person, place, and time. She appears well-developed and well-nourished. No distress.   HENT:   Head: Normocephalic and atraumatic.   Mouth/Throat: Oropharynx is clear and moist.   Eyes: Pupils are equal, round, and reactive to light.   Neck: Normal range of motion. Neck supple. No JVD present. No tracheal deviation present.   Cardiovascular: Normal rate and regular rhythm.    Pulmonary/Chest: Effort normal and breath sounds normal. No respiratory distress. She has no wheezes.   Abdominal: Soft. Bowel sounds are normal. She exhibits no distension and no mass. Has suprapubic tenderness. No cva tenderness Musculoskeletal: Normal range of motion. She exhibits no edema. Tenderness in thoracic spine area Left sided hemiparesis. Has scoliosis and kyphosis and is s/p kyphoplasty  in past Lymphadenopathy:  She has no cervical adenopathy.  Neurological: She is alert and oriented to person, place, and time.   Skin: Skin is warm and dry. Erythematous rash-circular  pattern on both thighs Psychiatric: She has a normal mood and affect. Her behavior is normal.   Labs reviewed-  04/23/13 na 138, k 4.2, bun 30.8, cr 0.8, fe 6, tibc 181, iron sat 4, ferritin 154, folic acid 11.4, b12 340 04/23/13 wbc 13.6, hb 9.4, hct 30.6, plt 281 04/26/13 bmp wnl, wbc 6.7, hb 10, hct 31.6 04/23/13 cxr- patchy interstital chnages medial LLL consistent with PNA 05/24/13 wbc 6.7, hb 9.7, hct 31, plt 266, na 138, k 4.5, bun 19, cr 0.7, glu 80, ca 8.9 08/02/13 na 136, k 3.9, bun 13, cr 0.7, wbc 6.3, hb 9.3, hct 30.7, plt 343 09/27/13 na 142, k 4.1, glu 86, bun 23, cr 0.9, lft wnl, tg 185, rest of chol panel wnl, wbc 6.4, hb 13.4, plt 215, normal eosinophil count  Assessment/Plan  UTI Send ua with c/s. Will start her empirically on bactrim ds 1 tab q12 h x 5 days, encouraged hydration. Will also have her on flavoxate 100 mg bid x 3 days for urinary spasm  dermatitis Possible lupus. Continue plaquenil with folic acid supplement. Monitor h/h. Has follow up with dr Terri Piedralupton 3/16  Chronic back pain Continue fentanyl patch 32.5 mg q72 h. Continue tylenol 650 mg tid prn and gabapentin 100 mg po tid  Temporal arteritis Continue low dose prednisone for now  Anemia Continue iron supplement and b12 supplement and monitor h/h  GERD Symptoms under good control. Continue protonix 20 mg bid  Osteopenia contineu ca-vit d supplement. Continue prolia injection 60 mg sq twice a year   Depression Mood remains stable at present. Continue celexa 20 mg daily and remeron 15 mg daily for now. Tolerating it well    Old cva bp remains stable. Continue plavix for now   [facility] Center[facility]IM[facility] Aunna Snooks, MD  [facility] [phone] (Monday-Friday 8 am - 5 pm) [phone] (afterhours)

## 2013-11-01 ENCOUNTER — Other Ambulatory Visit: Payer: Self-pay | Admitting: *Deleted

## 2013-11-01 MED ORDER — FENTANYL 25 MCG/HR TD PT72: MEDICATED_PATCH | TRANSDERMAL | Status: DC

## 2013-11-01 MED ORDER — FENTANYL 12 MCG/HR TD PT72: MEDICATED_PATCH | TRANSDERMAL | Status: DC

## 2013-11-01 NOTE — Telephone Encounter (Signed)
[facility] 

## 2013-11-26 ENCOUNTER — Non-Acute Institutional Stay (SKILLED_NURSING_FACILITY): Payer: PRIVATE HEALTH INSURANCE | Admitting: Internal Medicine

## 2013-11-26 DIAGNOSIS — M159 Polyosteoarthritis, unspecified: Secondary | ICD-10-CM

## 2013-11-26 DIAGNOSIS — I10 Essential (primary) hypertension: Secondary | ICD-10-CM

## 2013-11-26 DIAGNOSIS — M810 Age-related osteoporosis without current pathological fracture: Secondary | ICD-10-CM

## 2013-11-26 DIAGNOSIS — M359 Systemic involvement of connective tissue, unspecified: Secondary | ICD-10-CM

## 2013-11-30 ENCOUNTER — Other Ambulatory Visit: Payer: Self-pay | Admitting: *Deleted

## 2013-11-30 MED ORDER — FENTANYL 25 MCG/HR TD PT72: MEDICATED_PATCH | TRANSDERMAL | Status: DC

## 2013-11-30 MED ORDER — FENTANYL 12 MCG/HR TD PT72: MEDICATED_PATCH | TRANSDERMAL | Status: DC

## 2013-11-30 NOTE — Telephone Encounter (Signed)
[facility] 

## 2013-12-18 NOTE — Progress Notes (Signed)
Patient ID: Erica French, female   DOB: [birth year], 80 y.o.   MRN: [id number]    Erica French  Code Status: full code  Chief complaint- medical management of chronic illness  Allergies reviewed  HPI:   80 y/o female patient is here for long term French. Continues to be on plaquenil. Her rash has improved. Other chronic medical issues have been stable. No other complaints.   Review of Systems:  No fever or chills No nausea or vomiting No focal weakness No falls reported No new skin concern No recent behavioral changes reported    Past Medical History  Diagnosis Date  . COPD (chronic obstructive pulmonary disease)   . Anxiety   . Hypertension   . Depression   . Thyroid nodule   . Headache(784.0)     temple artiritis  . Stroke     left sided weakness- transfers with walke rand assiast  . Pneumonia 05/2012  . H/O arteritis     Temporal  . Gastritis    Past Surgical History  Procedure Laterality Date  . Hernia repair    . Cholecystectomy    . Thyroid surgery    . Esophagogastroduodenoscopy  06/08/2012    Procedure: ESOPHAGOGASTRODUODENOSCOPY (EGD);  Surgeon: Florencia Reasons, MD;  Location: [facility] ENDOSCOPY;  Service: Endoscopy;  Laterality: N/A;  . Appendectomy    . Oophorectomy    . Kyphoplasty  2012  . Kyphoplasty N/A 11/24/2012    Procedure: KYPHOPLASTY;  Surgeon: Barnett Abu, MD;  Location: [facility] NEURO [facility]S;  Service: Neurosurgery;  Laterality: N/A;  Thoracic Five Thoracic Six and Thoracic Seven Acrylic Balloon Kyphoplasty   Medication reviewed. See [facility]  Physical exam BP 139/76  Pulse 71  Temp(Src) 98.2 F (36.8 C)  Resp 18  Constitutional: She is oriented to person, place, and time. She appears well-developed and well-nourished. No distress.   HENT:   Head: Normocephalic and atraumatic.   Mouth/Throat: Oropharynx is clear and moist.   Eyes: Pupils are equal, round, and reactive to light.   Neck: Normal range of motion. Neck supple. No JVD present.  No tracheal deviation present.   Cardiovascular: Normal rate and regular rhythm.    Pulmonary/Chest: Effort normal and breath sounds normal. No respiratory distress. She has no wheezes.   Abdominal: Soft. Bowel sounds are normal. She exhibits no distension and no mass. Has suprapubic tenderness. No cva tenderness Musculoskeletal: Normal range of motion. She exhibits no edema. Tenderness in thoracic spine area Left sided hemiparesis. Has scoliosis and kyphosis and is s/p kyphoplasty in past Lymphadenopathy:  She has no cervical adenopathy.  Neurological: She is alert and oriented to person, place, and time.   Skin: Skin is warm and dry. geri sleeves in place. Rash in thighs are minimal and almost resolved Psychiatric: She has a normal mood and affect. Her behavior is normal.   Labs reviewed-  04/23/13 na 138, k 4.2, bun 30.8, cr 0.8, fe 6, tibc 181, iron sat 4, ferritin 154, folic acid 11.4, b12 340 04/23/13 wbc 13.6, hb 9.4, hct 30.6, plt 281 04/26/13 bmp wnl, wbc 6.7, hb 10, hct 31.6 04/23/13 cxr- patchy interstital chnages medial LLL consistent with PNA 05/24/13 wbc 6.7, hb 9.7, hct 31, plt 266, na 138, k 4.5, bun 19, cr 0.7, glu 80, ca 8.9 08/02/13 na 136, k 3.9, bun 13, cr 0.7, wbc 6.3, hb 9.3, hct 30.7, plt 343 09/27/13 na 142, k 4.1, glu 86, bun 23, cr 0.9, lft wnl, tg 185, rest  of chol panel wnl, wbc 6.4, hb 13.4, plt 215, normal eosinophil count  Assessment/Plan  HTN Off all medications and bp remains stable, goal SBP < 140  OA Stable on fentanyl patch with ca-vit d. Also on rx for osteoporosis  Senile osteoporosis Stable, no falls, continue ca-vit d and prolia injection bid  Autoimmune disease with dermatitis Continue plaquenil and folic acid. Also on low dose prednisone for her temporal arteritis   Oneal GroutMAHIMA Denaya Horn, MD  [facility] [phone] (Monday-Friday 8 am - 5 pm) [phone] (afterhours)

## 2013-12-19 DIAGNOSIS — M359 Systemic involvement of connective tissue, unspecified: Secondary | ICD-10-CM | POA: Insufficient documentation

## 2013-12-19 DIAGNOSIS — M159 Polyosteoarthritis, unspecified: Secondary | ICD-10-CM | POA: Insufficient documentation

## 2013-12-19 DIAGNOSIS — M810 Age-related osteoporosis without current pathological fracture: Secondary | ICD-10-CM | POA: Insufficient documentation

## 2013-12-19 DIAGNOSIS — I10 Essential (primary) hypertension: Secondary | ICD-10-CM | POA: Insufficient documentation

## 2013-12-31 ENCOUNTER — Other Ambulatory Visit: Payer: Self-pay | Admitting: *Deleted

## 2013-12-31 MED ORDER — FENTANYL 25 MCG/HR TD PT72: MEDICATED_PATCH | TRANSDERMAL | Status: DC

## 2013-12-31 MED ORDER — FENTANYL 12 MCG/HR TD PT72: MEDICATED_PATCH | TRANSDERMAL | Status: DC

## 2013-12-31 NOTE — Telephone Encounter (Signed)
Rx faxed to [facility] @ [phone] 

## 2014-01-10 ENCOUNTER — Non-Acute Institutional Stay (SKILLED_NURSING_FACILITY): Payer: PRIVATE HEALTH INSURANCE | Admitting: Internal Medicine

## 2014-01-10 ENCOUNTER — Other Ambulatory Visit: Payer: Self-pay

## 2014-01-10 DIAGNOSIS — R0902 Hypoxemia: Secondary | ICD-10-CM

## 2014-01-10 DIAGNOSIS — R748 Abnormal levels of other serum enzymes: Secondary | ICD-10-CM

## 2014-01-10 DIAGNOSIS — R6510 Systemic inflammatory response syndrome (SIRS) of non-infectious origin without acute organ dysfunction: Secondary | ICD-10-CM

## 2014-01-10 LAB — CBC WITH DIFFERENTIAL/PLATELET
Basophil #: 0 10*3/uL (ref 0.0–0.1)
Basophil %: 0.2 %
EOS ABS: 0 10*3/uL (ref 0.0–0.7)
EOS PCT: 0.5 %
HCT: 38.3 % (ref 35.0–47.0)
HGB: 12.3 g/dL (ref 12.0–16.0)
LYMPHS PCT: 16.6 %
Lymphocyte #: 1.6 10*3/uL (ref 1.0–3.6)
MCH: 29.8 pg (ref 26.0–34.0)
MCHC: 32 g/dL (ref 32.0–36.0)
MCV: 93 fL (ref 80–100)
Monocyte #: 0.4 x10 3/mm (ref 0.2–0.9)
Monocyte %: 3.9 %
NEUTROS PCT: 78.8 %
Neutrophil #: 7.5 10*3/uL — ABNORMAL HIGH (ref 1.4–6.5)
Platelet: 204 10*3/uL (ref 150–440)
RBC: 4.11 10*6/uL (ref 3.80–5.20)
RDW: 14.9 % — ABNORMAL HIGH (ref 11.5–14.5)
WBC: 9.5 10*3/uL (ref 3.6–11.0)

## 2014-01-10 LAB — COMPREHENSIVE METABOLIC PANEL
ALK PHOS: 385 U/L — AB
AST: 1174 U/L — AB (ref 15–37)
Albumin: 3.5 g/dL (ref 3.4–5.0)
Anion Gap: 6 — ABNORMAL LOW (ref 7–16)
BILIRUBIN TOTAL: 0.9 mg/dL (ref 0.2–1.0)
BUN: 16 mg/dL (ref 7–18)
Calcium, Total: 9.3 mg/dL (ref 8.5–10.1)
Chloride: 102 mmol/L (ref 98–107)
Co2: 32 mmol/L (ref 21–32)
Creatinine: 0.84 mg/dL (ref 0.60–1.30)
GLUCOSE: 85 mg/dL (ref 65–99)
Osmolality: 280 (ref 275–301)
POTASSIUM: 3.8 mmol/L (ref 3.5–5.1)
SGPT (ALT): 497 U/L — ABNORMAL HIGH (ref 12–78)
Sodium: 140 mmol/L (ref 136–145)
Total Protein: 7.2 g/dL (ref 6.4–8.2)

## 2014-01-10 NOTE — Progress Notes (Signed)
Patient ID: Erica French, female   DOB: May 25, 1930, 80 y.o.   MRN: [id number]    [facility]  Chief Complaint  Patient presents with  . Medical Management of Chronic Issues    fever, lethargy, hypoxia   Allergies  Allergen Reactions  . Prozac [Fluoxetine Hcl] Anaphylaxis, Hives, Itching, Swelling and Rash  . Codeine Itching and Rash   HPI 80 y/o female pt seen today for AV. She is a long term resident and has had fever with lethargy and noted to be hypoxic this am.she is seen in her room. She appears weak and lethargic. She complaints of discomfort in her belly and being short of breath at times. She has also been nauseous. No vomiting. She does not have an appetite. Has been feverish and has chills.  Review of Systems  Constitutional: Negative for diaphoresis.  HENT: Negative for congestion.   Eyes: Negative for blurred vision, double vision  Respiratory: Negative for cough, wheezing.   Cardiovascular: Negative for chest pain, palpitations, orthopnea  Gastrointestinal: Negative for heartburn, diarrhea and constipation.  Genitourinary: Negative for dysuria Skin: Negative for itching and rash.  Neurological: Negative for dizziness  Past Medical History  Diagnosis Date  . COPD (chronic obstructive pulmonary disease)   . Anxiety   . Hypertension   . Depression   . Thyroid nodule   . Headache(784.0)     temple artiritis  . Stroke     left sided weakness- transfers with walke rand assiast  . Pneumonia 05/2012  . H/O arteritis     Temporal  . Gastritis    [facility]. [facility]  Physical exam BP 137/65  Pulse 112  Temp(Src) 101.1 F (38.4 C)  Resp 20  SpO2 85%  General- elderly female in no acute distress Head- atraumatic, normocephalic Eyes- pallor present, no icterus noted Neck- no lymphadenopathy Nose- no maxillary sinus tenderness, no frontal sinus tenderness Mouth- dry mucus membrane Cardiovascular-  normal s1,s2, no murmurs Respiratory- poor inspiratory effort, no wheeze, no rhonchi, no crackles Abdomen- bowel sounds present, soft, non tender Musculoskeletal: Normal range of motion. She exhibits no edema. Tenderness in thoracic spine area Left sided hemiparesis. Has scoliosis and kyphosis and is s/p kyphoplasty in past Lymphadenopathy:  She has no cervical adenopathy.  Neurological: She is alert  Skin: Skin is warm and dry.    Labs- 09/27/13 na 142, k 4.1, glu 86, bun 23, cr 0.9, lft wnl, tg 185, rest of chol panel wnl, wbc 6.4, hb 13.4, plt 215, normal eosinophil count 01/10/14 glu 85, bun 16, cr 0.84, na 140, k 3.8, ca 9.3, ast 1174, alt 497, alp 385, t.bil 0.9, alb 3.5, wbc 9.5, hb 12.3, hct 38.3, plt 204,neutrophil 7.5 01/10/14 urine- cloudy but negative for elukocytes and nitrites; no growth in culture  Assessment/plan  SIRS Had fever, tachycardia and was hypoxic and has abdominal discomfort, deranged LFT (new) and is lethargic. Will establish an iv access, start iv fluid normal saline@100  cc/hr for 1 litre. Is afebrile at present. If has another temperature spike of > 101, will need to send 2 set of blood culture. Start her empirically on levofloxacin 750 mg iv once a day for 5 days. Cbc with diff, lft, u/a with c/s and cxr stat. Monitor vitals q shift. If condition declines will get sepsis workup  Hypoxia At present saturating well on 2 l o2. Will get cxr to rule out pneumonia. Aspiration precautions for now.   Deranged LFT Unclear  of the cause at present. Will need a stat abdominal ultrasound to assess her liver and gall bladder. Concern for cholecystitis vs autoimmune hepatitis. Iv fluids, reviewed med list- recently came off plaquenil. Will get repeat lft in am. Off tylenol and hold po meds with her nausea until am

## 2014-01-13 ENCOUNTER — Encounter: Payer: Self-pay | Admitting: Internal Medicine

## 2014-01-13 ENCOUNTER — Non-Acute Institutional Stay (SKILLED_NURSING_FACILITY): Payer: PRIVATE HEALTH INSURANCE | Admitting: Internal Medicine

## 2014-01-13 DIAGNOSIS — D509 Iron deficiency anemia, unspecified: Secondary | ICD-10-CM

## 2014-01-13 DIAGNOSIS — N390 Urinary tract infection, site not specified: Secondary | ICD-10-CM

## 2014-01-13 DIAGNOSIS — D8989 Other specified disorders involving the immune mechanism, not elsewhere classified: Secondary | ICD-10-CM | POA: Insufficient documentation

## 2014-01-13 DIAGNOSIS — K8309 Other cholangitis: Secondary | ICD-10-CM | POA: Insufficient documentation

## 2014-01-13 DIAGNOSIS — F3289 Other specified depressive episodes: Secondary | ICD-10-CM

## 2014-01-13 DIAGNOSIS — F329 Major depressive disorder, single episode, unspecified: Secondary | ICD-10-CM

## 2014-01-13 DIAGNOSIS — M359 Systemic involvement of connective tissue, unspecified: Secondary | ICD-10-CM

## 2014-01-13 DIAGNOSIS — F32A Depression, unspecified: Secondary | ICD-10-CM

## 2014-01-13 NOTE — Progress Notes (Signed)
Patient ID: Erica French, female   DOB: 1930-07-09, 80 y.o.   MRN: [id number]    [facility]  Chief Complaint  Patient presents with  . Medical Management of Chronic Issues    routine follow up   Allergies  Allergen Reactions  . Prozac [Fluoxetine Hcl] Anaphylaxis, Hives, Itching, Swelling and Rash  . Codeine Itching and Rash   Code status- DNR  HPI 80 y/o female patient is seen today for routine visit. She was seen few days back with concerns for SIRS. Her urine culture has no growth to date. She is now afebrile and feels better. Her lft have been reviewed along with her ultrasound Her daughter is present in the room. She is in no distress and denies any complaints.  Review of Systems  Constitutional: Negative for fever, chills, weight loss, malaise/fatigue and diaphoresis.  HENT: Negative for congestion, hearing loss and sore throat.   Eyes: Negative for blurred vision, double vision and discharge.  Respiratory: Negative for cough, sputum production, shortness of breath and wheezing.   Cardiovascular: Negative for chest pain, palpitations, orthopnea and leg swelling.  Gastrointestinal: Negative for heartburn, nausea, vomiting, abdominal pain, diarrhea and constipation.  Genitourinary: Negative for dysuria, urgency, frequency and flank pain.  Musculoskeletal: Negative for back pain, falls, joint pain and myalgias.  Skin: Negative for itching and rash.  Neurological: Negative for dizziness, tingling, focal weakness and headaches.  Psychiatric/Behavioral: Negative for depression. The patient is not nervous/anxious.    Past Medical History  Diagnosis Date  . COPD (chronic obstructive pulmonary disease)   . Anxiety   . Hypertension   . Depression   . Thyroid nodule   . Headache(784.0)     temple artiritis  . Stroke     left sided weakness- transfers with walke rand assiast  . Pneumonia 05/2012  . H/O arteritis     Temporal  . Gastritis     Current Outpatient Prescriptions on File Prior to Visit  Medication Sig Dispense Refill  . calcium-vitamin D (OSCAL WITH D) 500-200 MG-UNIT per tablet Take 1 tablet by mouth 2 (two) times daily.      . cholecalciferol (VITAMIN D) 1000 UNITS tablet Take 2,000 Units by mouth daily.      . citalopram (CELEXA) 20 MG tablet Take 20 mg by mouth daily.      . clopidogrel (PLAVIX) 75 MG tablet Take 75 mg by mouth daily.      Marland Kitchen. denosumab (PROLIA) 60 MG/ML SOLN injection Inject 60 mg into the skin every 6 (six) months. Administer in upper arm, thigh, or abdomen      . fentaNYL (DURAGESIC - DOSED MCG/HR) 25 MCG/HR patch Apply one patch every 72 hours for pain. Remove old patch. External Use only. Rotate sites. Check placement of patch every shift  10 patch  0  . folic acid (FOLVITE) 0.5 MG tablet Take 1 mg by mouth daily.      Marland Kitchen. gabapentin (NEURONTIN) 100 MG capsule Take 100 mg by mouth 3 (three) times daily.      . iron polysaccharides (NIFEREX) 150 MG capsule Take 150 mg by mouth 2 (two) times daily.      . magnesium oxide (MAG-OX) 400 MG tablet Take 400 mg by mouth daily.      . mirtazapine (REMERON) 7.5 MG tablet Take 15 mg by mouth at bedtime.      . pantoprazole (PROTONIX) 40 MG tablet Take 20 mg by mouth 2 (two) times daily.      .Marland Kitchen  polyethylene glycol powder (GLYCOLAX/MIRALAX) powder Take 17 g by mouth daily.  3350 g  1  . predniSONE (DELTASONE) 1 MG tablet Take 1 tablet (1 mg total) by mouth daily.  30 tablet  3  . vitamin B-12 (CYANOCOBALAMIN) 1000 MCG tablet Take 1,000 mcg by mouth daily.      Marland Kitchen. acetaminophen (TYLENOL) 325 MG tablet Take 650 mg by mouth 2 (two) times daily.      . hydroxychloroquine (PLAQUENIL) 200 MG tablet Take 200 mg by mouth 2 (two) times daily.       No current facility-administered medications on file prior to visit.   Past Surgical History  Procedure Laterality Date  . Hernia repair    . Cholecystectomy    . Thyroid surgery    . Esophagogastroduodenoscopy   06/08/2012    Procedure: ESOPHAGOGASTRODUODENOSCOPY (EGD);  Surgeon: Florencia Reasonsobert V Buccini, MD;  [facility];  Service: Endoscopy;  Laterality: N/A;  . Appendectomy    . Oophorectomy    . Kyphoplasty  2012  . Kyphoplasty N/A 11/24/2012    Procedure: KYPHOPLASTY;  Surgeon: Barnett AbuHenry Elsner, MD;  Location: [facility]URO [facility];  Service: Neurosurgery;  Laterality: N/A;  Thoracic Five Thoracic Six and Thoracic Seven Acrylic Balloon Kyphoplasty    Physical exam BP 127/63  Pulse 76  Temp(Src) 97.8 F (36.6 C)  Resp 18  Wt 100 lb (45.36 kg)  SpO2 97%  General- elderly female in no acute distress Head- atraumatic, normocephalic Eyes- PERRLA, EOMI, pallor present, no icterus noted Neck- no lymphadenopathy, no thyromegaly, no jugular vein distension Nose- normal nasaal mucosa, no maxillary sinus tenderness, no frontal sinus tenderness Mouth- normal mucus membrane Cardiovascular- normal s1,s2, no murmurs/ rubs/ gallops, normal distal pulses Respiratory- bilateral clear to auscultation, no wheeze, no rhonchi, no crackles Abdomen- bowel sounds present, soft, non tender, no hepatomegaly Musculoskeletal: Normal range of motion. She exhibits no edema. Tenderness in thoracic spine area Left sided hemiparesis. Has scoliosis and kyphosis and is s/p kyphoplasty in past Lymphadenopathy:  She has no cervical adenopathy.  Neurological: She is alert and oriented to person, place, and time.   Skin: Skin is warm and dry.  Psychiatric: She has a normal mood and affect. Her behavior is normal.   Labs reviewed-  04/23/13 na 138, k 4.2, bun 30.8, cr 0.8, fe 6, tibc 181, iron sat 4, ferritin 154, folic acid 11.4, b12 340 04/23/13 wbc 13.6, hb 9.4, hct 30.6, plt 281 04/26/13 bmp wnl, wbc 6.7, hb 10, hct 31.6 04/23/13 cxr- patchy interstital chnages medial LLL consistent with PNA 05/24/13 wbc 6.7, hb 9.7, hct 31, plt 266, na 138, k 4.5, bun 19, cr 0.7, glu 80, ca 8.9 08/02/13 na 136, k 3.9, bun 13, cr 0.7, wbc 6.3, hb 9.3,  hct 30.7, plt 343 09/27/13 na 142, k 4.1, glu 86, bun 23, cr 0.9, lft wnl, tg 185, rest of chol panel wnl, wbc 6.4, hb 13.4, plt 215, normal eosinophil count 01/10/14 glu 85, bun 16, cr 0.84, na 140, k 3.8, ca 9.3, ast 1174, alt 497, alp 385, t.bil 0.9, alb 3.5, wbc 9.5, hb 12.3, hct 38.3, plt 204,neutrophil 7.5 01/11/14 t.pro 5.7, alb 3, t.bil 0.9, alp 264, ast 414, alt 324 01/10/14 urine- cloudy but negative for elukocytes and nitrites; no growth in culture  01/11/14 Liver ultrasound- surgically removed gall bladder. CBD dilated 2 cm. Also mild dilatation of intrahepatic biliary radicles. At the level of pancreatic head, cbd measures 4 mm. No ascites. Liver measures 11 x 6 x  9. Findings are consistent with stenosis involving CBD    Assessment/Plan  80 y/o female patient appears clinically better today  UTI Had switched her to po levofloxacin yesterday but With no growth in urine culture, will not need further antibiotic for uti rx now. Encouraged hydration  cholangitis With fever, abdominal pain, jaundice on exam and markedly elevated liver enzyme and ultrasound showing dilated CBD with stenosis at distal end, I have concern for biliary stasis, possible choledocholithiasis and cholangitis. She is on levofloxacin. Continue levofloxacin 500 mg daily with flagyl 500 mg tid for total 5 days from today. Monitor clinically for fever and worsening symptom. If this recurs, other differential could be autoimmune hepatitis given her rash with ? Autoimmune disorder that responded well to plaquenil. Repeat lft today  Autoimmune disorder Had rash thought to be from possible autoimmune disorder which responded well to plaquenil. This could be presentation of autoimmune hepatitis as well. Will get  ANA and anti sooth muscle antibody checked if symptom recurs. Less likely to be primary biliary cirrhosis or primary sclerosing cholangitis given normal bilirubin level and AST, ALT more elevated than ALP.    Depression Given her deranged lft, decrease celexa to 20 mg daily  Iron def anemia Will d/c iron supplement given this can also elevated liver enzymes. Monitor h/h   Reviewed care plan with patient, charge nurse, PA.

## 2014-01-26 ENCOUNTER — Other Ambulatory Visit: Payer: Self-pay | Admitting: *Deleted

## 2014-01-26 MED ORDER — FENTANYL 25 MCG/HR TD PT72: MEDICATED_PATCH | TRANSDERMAL | Status: DC

## 2014-01-26 NOTE — Telephone Encounter (Signed)
[facility] 

## 2014-02-10 ENCOUNTER — Non-Acute Institutional Stay (SKILLED_NURSING_FACILITY): Payer: PRIVATE HEALTH INSURANCE | Admitting: Internal Medicine

## 2014-02-10 DIAGNOSIS — R740 Nonspecific elevation of levels of transaminase and lactic acid dehydrogenase [LDH]: Secondary | ICD-10-CM

## 2014-02-10 DIAGNOSIS — R7402 Elevation of levels of lactic acid dehydrogenase (LDH): Secondary | ICD-10-CM

## 2014-02-10 DIAGNOSIS — E611 Iron deficiency: Secondary | ICD-10-CM

## 2014-02-10 DIAGNOSIS — D509 Iron deficiency anemia, unspecified: Secondary | ICD-10-CM

## 2014-02-10 DIAGNOSIS — R7401 Elevation of levels of liver transaminase levels: Secondary | ICD-10-CM

## 2014-02-10 DIAGNOSIS — F339 Major depressive disorder, recurrent, unspecified: Secondary | ICD-10-CM

## 2014-02-28 ENCOUNTER — Non-Acute Institutional Stay (SKILLED_NURSING_FACILITY): Payer: PRIVATE HEALTH INSURANCE | Admitting: Internal Medicine

## 2014-02-28 DIAGNOSIS — M159 Polyosteoarthritis, unspecified: Secondary | ICD-10-CM

## 2014-02-28 DIAGNOSIS — K219 Gastro-esophageal reflux disease without esophagitis: Secondary | ICD-10-CM

## 2014-02-28 DIAGNOSIS — K5900 Constipation, unspecified: Secondary | ICD-10-CM

## 2014-03-02 ENCOUNTER — Other Ambulatory Visit: Payer: Self-pay | Admitting: *Deleted

## 2014-03-02 MED ORDER — FENTANYL 25 MCG/HR TD PT72: MEDICATED_PATCH | TRANSDERMAL | Status: DC

## 2014-03-02 NOTE — Telephone Encounter (Signed)
[facility] 

## 2014-03-31 ENCOUNTER — Other Ambulatory Visit: Payer: Self-pay | Admitting: *Deleted

## 2014-03-31 MED ORDER — FENTANYL 25 MCG/HR TD PT72: MEDICATED_PATCH | TRANSDERMAL | Status: DC

## 2014-03-31 NOTE — Telephone Encounter (Signed)
[facility] 

## 2014-04-11 ENCOUNTER — Non-Acute Institutional Stay (SKILLED_NURSING_FACILITY): Payer: PRIVATE HEALTH INSURANCE | Admitting: Internal Medicine

## 2014-04-11 ENCOUNTER — Encounter: Payer: Self-pay | Admitting: Internal Medicine

## 2014-04-11 DIAGNOSIS — K219 Gastro-esophageal reflux disease without esophagitis: Secondary | ICD-10-CM

## 2014-04-11 DIAGNOSIS — F341 Dysthymic disorder: Secondary | ICD-10-CM

## 2014-04-11 DIAGNOSIS — R740 Nonspecific elevation of levels of transaminase and lactic acid dehydrogenase [LDH]: Secondary | ICD-10-CM

## 2014-04-11 DIAGNOSIS — R7401 Elevation of levels of liver transaminase levels: Secondary | ICD-10-CM | POA: Insufficient documentation

## 2014-04-11 DIAGNOSIS — F32A Depression, unspecified: Secondary | ICD-10-CM | POA: Insufficient documentation

## 2014-04-11 DIAGNOSIS — R7402 Elevation of levels of lactic acid dehydrogenase (LDH): Secondary | ICD-10-CM | POA: Insufficient documentation

## 2014-04-11 DIAGNOSIS — F329 Major depressive disorder, single episode, unspecified: Secondary | ICD-10-CM

## 2014-04-11 DIAGNOSIS — F339 Major depressive disorder, recurrent, unspecified: Secondary | ICD-10-CM | POA: Insufficient documentation

## 2014-04-11 DIAGNOSIS — E611 Iron deficiency: Secondary | ICD-10-CM | POA: Insufficient documentation

## 2014-04-11 DIAGNOSIS — M316 Other giant cell arteritis: Secondary | ICD-10-CM

## 2014-04-11 NOTE — Progress Notes (Signed)
Patient ID: Erica French, female   DOB: 16-May-1930, 80 y.o.   MRN: [id number]    Chief Complaint  Patient presents with  . Medical Management of Chronic Issues   Allergies  Allergen Reactions  . Prozac [Fluoxetine Hcl] Anaphylaxis, Hives, Itching, Swelling and Rash  . Cymbalta [Duloxetine Hcl]   . Sulfa Antibiotics   . Codeine Itching and Rash   HPI 80 y/o female pt is seen for routine visit. She complaints of feeling low and being depressed. Her back pain is under control. No other concerns. No falls or acute behavioral changes reported  Review of Systems  Constitutional: Negative for fever, chills, weight loss, malaise/fatigue and diaphoresis.  HENT: Negative for congestion, hearing loss and sore throat.   Eyes: Negative for blurred vision, double vision and discharge.  Respiratory: Negative for cough, sputum production, shortness of breath and wheezing.   Cardiovascular: Negative for chest pain, palpitations, orthopnea and leg swelling.  Gastrointestinal: Negative for heartburn, nausea, vomiting, abdominal pain, diarrhea and constipation.  Genitourinary: Negative for dysuria, urgency, frequency and flank pain.  Musculoskeletal: Negative for falls Skin: Negative for itching and rash.  Neurological: Negative for dizziness Psychiatric/Behavioral: The patient is not nervous/anxious.   Past Medical History  Diagnosis Date  . COPD (chronic obstructive pulmonary disease)   . Anxiety   . Hypertension   . Depression   . Thyroid nodule   . Headache(784.0)     temple artiritis  . Stroke     left sided weakness- transfers with walke rand assiast  . Pneumonia 05/2012  . H/O arteritis     Temporal  . Gastritis    Medication reviewed. See [facility]  Physical exam BP 104/55  Pulse 75  Temp(Src) 97.8 F (36.6 C)  Resp 16  General- elderly female in no acute distress Head- atraumatic, normocephalic Eyes- PERRLA, EOMI, pallor present, no icterus noted Mouth- normal mucus  membrane Cardiovascular- normal s1,s2, no murmurs Respiratory- bilateral clear to auscultation, no wheeze, no rhonchi, no crackles Abdomen- bowel sounds present, soft, non tender, no hepatomegaly Musculoskeletal: Normal range of motion. She exhibits no edema. Left sided hemiparesis. Has scoliosis and kyphosis and is s/p kyphoplasty in past Lymphadenopathy:  She has no cervical adenopathy.  Neurological: She is alert and oriented to person, place, and time.   Skin: Skin is warm and dry.   Psychiatric: She has flat affect. Her behavior is normal.   Labs reviewed-  04/23/13 na 138, k 4.2, bun 30.8, cr 0.8, fe 6, tibc 181, iron sat 4, ferritin 154, folic acid 11.4, b12 340 04/23/13 wbc 13.6, hb 9.4, hct 30.6, plt 281 04/26/13 bmp wnl, wbc 6.7, hb 10, hct 31.6 04/23/13 cxr- patchy interstital chnages medial LLL consistent with PNA 05/24/13 wbc 6.7, hb 9.7, hct 31, plt 266, na 138, k 4.5, bun 19, cr 0.7, glu 80, ca 8.9 08/02/13 na 136, k 3.9, bun 13, cr 0.7, wbc 6.3, hb 9.3, hct 30.7, plt 343 09/27/13 na 142, k 4.1, glu 86, bun 23, cr 0.9, lft wnl, tg 185, rest of chol panel wnl, wbc 6.4, hb 13.4, plt 215, normal eosinophil count 01/10/14 glu 85, bun 16, cr 0.84, na 140, k 3.8, ca 9.3, ast 1174, alt 497, alp 385, t.bil 0.9, alb 3.5, wbc 9.5, hb 12.3, hct 38.3, plt 204,neutrophil 7.5 01/11/14 t.pro 5.7, alb 3, t.bil 0.9, alp 264, ast 414, alt 324 01/10/14 urine- cloudy but negative for elukocytes and nitrites; no growth in culture  01/11/14 Liver ultrasound- surgically removed gall  bladder. CBD dilated 2 cm. Also mild dilatation of intrahepatic biliary radicles. At the level of pancreatic head, cbd measures 4 mm. No ascites. Liver measures 11 x 6 x 9. Findings are consistent with stenosis involving CBD     Assessment/Plan  Depression On celexa 20 mg daily and remeron 15 mg daily. Mentions feeling low and sad mostly. Has failed SSRI. Will try SNRI- pristiq 25 mg daily for now and go up to 50 mg daily if no  improvement noted in 2 weeks. Reassess. Will taper celexa to 10 mg daily for a week and stop it. Continue remeron for now  Temporal arteritis No recent flare up. Continue chronic 1 mg prednisone on daily basis  GERD Stable. Continue pantoprazole 20 mg daily

## 2014-04-11 NOTE — Progress Notes (Signed)
Patient ID: Erica French, female   DOB: Dec 24, 1929, 80 y.o.   MRN: [id number]    Code status- DNR  HPI 80 y/o female patient is seen today for routine visit. She is in no distress and denies any complaints. No new concern from staff  Review of Systems  Constitutional: Negative for fever, chills, weight loss Respiratory: Negative for cough, sputum production, shortness of breath and wheezing.   Cardiovascular: Negative for chest pain, palpitations, orthopnea and leg swelling.  Gastrointestinal: Negative for heartburn, nausea, vomiting, abdominal pain, diarrhea and constipation.  Genitourinary: Negative for dysuria Psychiatric/Behavioral: positive for depression.   Physical exam Vital signs stable    General- elderly female in no acute distress, thin built Neck- no lymphadenopathy Cardiovascular- normal s1,s2, no murmurs Respiratory- bilateral clear to auscultation, no wheeze, no rhonchi, no crackles Abdomen- bowel sounds present, soft, non tender Neurological: She is alert and oriented to person, place, and time.   Skin: Skin is warm and dry.   Psychiatric: She has a normal mood and affect. Her behavior is normal.   Labs reviewed-  04/23/13 na 138, k 4.2, bun 30.8, cr 0.8, fe 6, tibc 181, iron sat 4, ferritin 154, folic acid 11.4, b12 340 04/23/13 wbc 13.6, hb 9.4, hct 30.6, plt 281 04/26/13 bmp wnl, wbc 6.7, hb 10, hct 31.6 04/23/13 cxr- patchy interstital chnages medial LLL consistent with PNA 05/24/13 wbc 6.7, hb 9.7, hct 31, plt 266, na 138, k 4.5, bun 19, cr 0.7, glu 80, ca 8.9 08/02/13 na 136, k 3.9, bun 13, cr 0.7, wbc 6.3, hb 9.3, hct 30.7, plt 343 09/27/13 na 142, k 4.1, glu 86, bun 23, cr 0.9, lft wnl, tg 185, rest of chol panel wnl, wbc 6.4, hb 13.4, plt 215, normal eosinophil count 01/10/14 glu 85, bun 16, cr 0.84, na 140, k 3.8, ca 9.3, ast 1174, alt 497, alp 385, t.bil 0.9, alb 3.5, wbc 9.5, hb 12.3, hct 38.3, plt 204,neutrophil 7.5 01/11/14 t.pro 5.7, alb 3, t.bil 0.9, alp  264, ast 414, alt 324 01/10/14 urine- cloudy but negative for elukocytes and nitrites; no growth in culture  01/11/14 Liver ultrasound- surgically removed gall bladder. CBD dilated 2 cm. Also mild dilatation of intrahepatic biliary radicles. At the level of pancreatic head, cbd measures 4 mm. No ascites. Liver measures 11 x 6 x 9. Findings are consistent with stenosis involving CBD     Assessment/Plan  Depression Tolerating remeron well for now. Monitor clinically. Continue celexa 20 mg daily  Deranged LFT Much improved, monitor clinically for now, gi referral has been declined   Iron deficiency anemia Continue iron and folic acid

## 2014-04-29 ENCOUNTER — Other Ambulatory Visit: Payer: Self-pay | Admitting: *Deleted

## 2014-04-29 MED ORDER — FENTANYL 25 MCG/HR TD PT72: MEDICATED_PATCH | TRANSDERMAL | Status: DC

## 2014-04-29 NOTE — Telephone Encounter (Signed)
[facility] 

## 2014-05-05 ENCOUNTER — Non-Acute Institutional Stay (SKILLED_NURSING_FACILITY): Payer: PRIVATE HEALTH INSURANCE | Admitting: Internal Medicine

## 2014-05-05 DIAGNOSIS — F339 Major depressive disorder, recurrent, unspecified: Secondary | ICD-10-CM

## 2014-05-05 DIAGNOSIS — K219 Gastro-esophageal reflux disease without esophagitis: Secondary | ICD-10-CM | POA: Insufficient documentation

## 2014-05-05 DIAGNOSIS — G8190 Hemiplegia, unspecified affecting unspecified side: Secondary | ICD-10-CM

## 2014-05-05 DIAGNOSIS — M316 Other giant cell arteritis: Secondary | ICD-10-CM

## 2014-05-05 DIAGNOSIS — I69354 Hemiplegia and hemiparesis following cerebral infarction affecting left non-dominant side: Secondary | ICD-10-CM | POA: Insufficient documentation

## 2014-05-05 DIAGNOSIS — M159 Polyosteoarthritis, unspecified: Secondary | ICD-10-CM | POA: Insufficient documentation

## 2014-05-05 NOTE — Progress Notes (Signed)
Patient ID: Erica French, female   DOB: 11-19-1929, 80 y.o.   MRN: [id number]    [facility]  Code status- DNR  Allergies reviewed  Chief Complaint  Patient presents with  . Medical Management of Chronic Issues   HPI 80 y/o female patient is seen today for routine visit. She is in no distress and denies any complaints. S/p upper teeth extractions and on mechanical soft diet.weight is stable  Review of Systems  Constitutional: Negative for fever, chills, weight loss, malaise/fatigue and diaphoresis.  HENT: Negative for congestion, hearing loss and sore throat.   Eyes: Negative for blurred vision, double vision and discharge.  Respiratory: Negative for cough, sputum production, shortness of breath and wheezing.   Cardiovascular: Negative for chest pain, palpitations, orthopnea and leg swelling.  Gastrointestinal: Negative for heartburn, nausea, vomiting, abdominal pain, diarrhea and constipation.  Psychiatric/Behavioral: Negative for depression. The patient is not nervous/anxious.      [facility]  Physical exam BP 130/89  Pulse 81  Resp 18  General- elderly female in no acute distress Head- atraumatic, normocephalic Eyes- PERRLA, EOMI, pallor present, no icterus noted Neck- no lymphadenopathy, no thyromegaly, no jugular vein distension Mouth- normal mucus membrane Cardiovascular- normal s1,s2, no murmurs/ rubs/ gallops, normal distal pulses Respiratory- bilateral clear to auscultation, no wheeze, no rhonchi, no crackles Musculoskeletal: Normal range of motion. She exhibits no edema. Left sided hemiparesis. Has scoliosis and kyphosis and is s/p kyphoplasty in past  Skin: Skin is warm and dry.   Psychiatric: She has a normal mood and affect. Her behavior is normal.     Assessment/Plan  gerd Continue protonix 20 mg bid and monitor  OA Continue her neurontin with duragesic  Constipation  Continue miralax

## 2014-05-05 NOTE — Progress Notes (Signed)
Patient ID: Erica French, female   DOB: Jan 28, 1930, 80 y.o.   MRN: [id number]    [facility]  Code status- DNR  Cc- RV  Allergies reviewed  HPI 80 y/o female patient is seen today for routine visit. No new concern from pt today. She has giant cell arteritis with hemiplegia, back issues, depression among others  Review of Systems  Constitutional: Negative for fever, chills, weight loss, malaise/fatigue and diaphoresis.  Respiratory: Negative for cough, sputum production, shortness of breath and wheezing.   Cardiovascular: Negative for chest pain, palpitations, orthopnea and leg swelling.  Gastrointestinal: Negative for heartburn, nausea, vomiting, abdominal pain Neurological: Negative for dizziness  Past Medical History  Diagnosis Date  . COPD (chronic obstructive pulmonary disease)   . Anxiety   . Hypertension   . Depression   . Thyroid nodule   . Headache(784.0)     temple artiritis  . Stroke     left sided weakness- transfers with walke rand assiast  . Pneumonia 05/2012  . H/O arteritis     Temporal  . Gastritis    [facility]  Physical exam BP 104/60  Pulse 66  Temp(Src) 97.9 F (36.6 C)  Resp 17  SpO2 96%  General- elderly female in no acute distress Cardiovascular- normal s1,s2, no murmurs/ rubs/ gallops, normal distal pulses Respiratory- bilateral clear to auscultation, no wheeze, no rhonchi, no crackles Abdomen- bowel sounds present, soft, non tender, no hepatomegaly Musculoskeletal: Normal range of motion. She exhibits no edema. Tenderness in thoracic spine area Left sided hemiparesis. Has scoliosis and kyphosis and is s/p kyphoplasty in past Psychiatric: She has a normal mood and affect. Her behavior is normal.   Labs reviewed-  04/23/13 na 138, k 4.2, bun 30.8, cr 0.8, fe 6, tibc 181, iron sat 4, ferritin 154, folic acid 11.4, b12 340 04/23/13 wbc 13.6, hb 9.4, hct 30.6, plt 281 04/26/13  bmp wnl, wbc 6.7, hb 10, hct 31.6 04/23/13 cxr- patchy interstital chnages medial LLL consistent with PNA 05/24/13 wbc 6.7, hb 9.7, hct 31, plt 266, na 138, k 4.5, bun 19, cr 0.7, glu 80, ca 8.9 08/02/13 na 136, k 3.9, bun 13, cr 0.7, wbc 6.3, hb 9.3, hct 30.7, plt 343 09/27/13 na 142, k 4.1, glu 86, bun 23, cr 0.9, lft wnl, tg 185, rest of chol panel wnl, wbc 6.4, hb 13.4, plt 215, normal eosinophil count 01/10/14 glu 85, bun 16, cr 0.84, na 140, k 3.8, ca 9.3, ast 1174, alt 497, alp 385, t.bil 0.9, alb 3.5, wbc 9.5, hb 12.3, hct 38.3, plt 204,neutrophil 7.5 01/11/14 t.pro 5.7, alb 3, t.bil 0.9, alp 264, ast 414, alt 324 01/10/14 urine- cloudy but negative for elukocytes and nitrites; no growth in culture  01/11/14 Liver ultrasound- surgically removed gall bladder. CBD dilated 2 cm. Also mild dilatation of intrahepatic biliary radicles. At the level of pancreatic head, cbd measures 4 mm. No ascites. Liver measures 11 x 6 x 9. Findings are consistent with stenosis involving CBD     Assessment/Plan  giant cell arteritis Stable and on chronic prednisone, continue this  cva with hemiplegia Continue plavix, stable  Depression Stable, continue remeron and celexa for now

## 2014-06-13 ENCOUNTER — Non-Acute Institutional Stay (SKILLED_NURSING_FACILITY): Payer: PRIVATE HEALTH INSURANCE | Admitting: Internal Medicine

## 2014-06-13 DIAGNOSIS — M159 Polyosteoarthritis, unspecified: Secondary | ICD-10-CM

## 2014-06-13 DIAGNOSIS — E039 Hypothyroidism, unspecified: Secondary | ICD-10-CM

## 2014-06-13 DIAGNOSIS — K219 Gastro-esophageal reflux disease without esophagitis: Secondary | ICD-10-CM

## 2014-06-21 NOTE — Progress Notes (Signed)
Patient ID: Erica French, female   DOB: [birth year], 80 y.o.   MRN: [id number]  [facility]  Code status- DNR  Chief complaint- routine visit  Allergies reviewed  Code status- DNR  HPI 80 y/o female patient is seen today for routine visit. Her daughter is present in her room. She is in no distress and denies any complaints. No concern from staff.   Review of Systems  Constitutional: Negative for fever, chills, malaise/fatigue and diaphoresis.  HENT: Negative for congestion  Respiratory: Negative for cough, sputum production, shortness of breath and wheezing.   Cardiovascular: Negative for chest pain, palpitations, orthopnea and leg swelling.  Gastrointestinal: Negative for heartburn, nausea, vomiting, abdominal pain.  Genitourinary: Negative for dysuria.  Musculoskeletal: Negative for falls.  Skin: Negative for itching and rash.  Neurological: Negative for dizziness, tingling, headaches.  Psychiatric/Behavioral: Negative for depression.   Past Medical History  Diagnosis Date  . COPD (chronic obstructive pulmonary disease)   . Anxiety   . Hypertension   . Depression   . Thyroid nodule   . Headache(784.0)     temple artiritis  . Stroke     left sided weakness- transfers with walke rand assiast  . Pneumonia 05/2012  . H/O arteritis     Temporal  . Gastritis    [facility]  Physical exam BP 108/62  Pulse 88  Temp(Src) 98 F (36.7 C)  Resp 16  SpO2 95%    General- elderly female in no acute distress Head- atraumatic, normocephalic Eyes- PERRLA, EOMI Neck- no thyromegaly, no jugular vein distension Mouth- normal mucus membrane Cardiovascular- normal s1,s2, no murmurs, diminished pedal pulses, no edema Respiratory- bilateral clear to auscultation, no wheeze, no rhonchi, no crackles Abdomen- bowel sounds present, soft, non tender Musculoskeletal: left hemiparesis, normal ROM on right sided. Assist with  transfers Lymphadenopathy: She has no cervical adenopathy.  Neurological: She is alert and oriented to person, place, and time.   Skin: Skin is warm and dry.  gerisleeves in place Psychiatric: She has a normal mood and affect. Her behavior is normal.   Labs reviewed-  01/10/14 glu 85, bun 16, cr 0.84, na 140, k 3.8, ca 9.3, ast 1174, alt 497, alp 385, t.bil 0.9, alb 3.5, wbc 9.5, hb 12.3, hct 38.3, plt 204,neutrophil 7.5 01/11/14 t.pro 5.7, alb 3, t.bil 0.9, alp 264, ast 414, alt 324 01/10/14 urine- cloudy but negative for elukocytes and nitrites; no growth in culture 05/12/14- iron 51, ferritin 29, b12 1500, wbc 6.2, hb 10.4, hct 34.9, mcv 95.1, plt 288, na 138, k 4.2, bun 23, cr 0.8, glu 74, ca 9.4    Assessment/Plan  gerd On prilosec 20 mg bid, tolerating lowered dosing well.   OA Stable on tylenol, neurontin and duragesic. Monitor bowel movement. Continue miralax  Hypothyroidism Continue her levothyroxine and monitor tsh, s/p partial thyroidectomy

## 2014-06-26 DIAGNOSIS — E039 Hypothyroidism, unspecified: Secondary | ICD-10-CM | POA: Insufficient documentation

## 2014-06-28 ENCOUNTER — Other Ambulatory Visit: Payer: Self-pay | Admitting: *Deleted

## 2014-06-28 MED ORDER — FENTANYL 25 MCG/HR TD PT72: MEDICATED_PATCH | TRANSDERMAL | Status: DC

## 2014-06-28 NOTE — Telephone Encounter (Signed)
[facility] 

## 2014-07-11 ENCOUNTER — Non-Acute Institutional Stay (SKILLED_NURSING_FACILITY): Payer: PRIVATE HEALTH INSURANCE | Admitting: Internal Medicine

## 2014-07-11 DIAGNOSIS — K219 Gastro-esophageal reflux disease without esophagitis: Secondary | ICD-10-CM

## 2014-07-11 DIAGNOSIS — M545 Low back pain, unspecified: Secondary | ICD-10-CM

## 2014-07-11 DIAGNOSIS — M810 Age-related osteoporosis without current pathological fracture: Secondary | ICD-10-CM

## 2014-07-11 DIAGNOSIS — F329 Major depressive disorder, single episode, unspecified: Secondary | ICD-10-CM

## 2014-07-11 DIAGNOSIS — R1314 Dysphagia, pharyngoesophageal phase: Secondary | ICD-10-CM

## 2014-07-11 DIAGNOSIS — F32A Depression, unspecified: Secondary | ICD-10-CM

## 2014-07-11 DIAGNOSIS — G8929 Other chronic pain: Secondary | ICD-10-CM

## 2014-07-11 DIAGNOSIS — E46 Unspecified protein-calorie malnutrition: Secondary | ICD-10-CM

## 2014-07-11 DIAGNOSIS — M316 Other giant cell arteritis: Secondary | ICD-10-CM

## 2014-07-11 DIAGNOSIS — E039 Hypothyroidism, unspecified: Secondary | ICD-10-CM

## 2014-07-11 DIAGNOSIS — G8190 Hemiplegia, unspecified affecting unspecified side: Secondary | ICD-10-CM

## 2014-07-11 DIAGNOSIS — Z0000 Encounter for general adult medical examination without abnormal findings: Secondary | ICD-10-CM

## 2014-07-11 DIAGNOSIS — G8191 Hemiplegia, unspecified affecting right dominant side: Secondary | ICD-10-CM

## 2014-07-11 NOTE — Progress Notes (Signed)
Patient ID: Erica French, female   DOB: 04/22/1930, 80 y.o.   MRN: [id number]     Facility: [facility]  Chief complaint- annual exam  Allergies reviewed  Code status- DNR  HPI 80 y/o female patient is seen today for annual visit. She is at her baseline with no no new nursing concerns. She is lying in her bed and is reading newspaper She has hx of HTN, temporal arteritis, CVA with hemiplegia, PVD, GERD, COPD, osteoporosis among others. She is uptodate with influenza vaccine. She is in no distress and denies any complaints. She is working with therapy team for strengthening, transfers and positioning. Her weight has been stable, infact has gained some pounds. No new behavior changes. No wounds reported. No falls reported.  Review of Systems   Constitutional: Negative for fever, chills, malaise/fatigue and diaphoresis.   HENT: Negative for congestion   Respiratory: Negative for cough, sputum production, shortness of breath and wheezing.    Cardiovascular: Negative for chest pain, palpitations.   Gastrointestinal: Negative for heartburn, nausea, vomiting, abdominal pain.   Genitourinary: Negative for dysuria.   Musculoskeletal: Negative for falls.   Skin: Negative for itching and rash.   Neurological: Negative for dizziness, tingling, headaches.   Psychiatric/Behavioral: Negative for depression.  Past Medical History  Diagnosis Date  . COPD (chronic obstructive pulmonary disease)   . Anxiety   . Hypertension   . Depression   . Thyroid nodule   . Headache(784.0)     temple artiritis  . Stroke     left sided weakness- transfers with walke rand assiast  . Pneumonia 05/2012  . H/O arteritis     Temporal  . Gastritis    Past Surgical History  Procedure Laterality Date  . Hernia repair    . Cholecystectomy    . Thyroid surgery    . Esophagogastroduodenoscopy  06/08/2012    Procedure: ESOPHAGOGASTRODUODENOSCOPY (EGD);  Surgeon: Florencia Reasonsobert  V Buccini, MD;  [facility];  Service: Endoscopy;  Laterality: N/A;  . Appendectomy    . Oophorectomy    . Kyphoplasty  2012  . Kyphoplasty N/A 11/24/2012    Procedure: KYPHOPLASTY;  Surgeon: Barnett AbuHenry Elsner, MD;  Location: [facility] NEURO ORS;  Service: Neurosurgery;  Laterality: N/A;  Thoracic Five Thoracic Six and Thoracic Seven Acrylic Balloon Kyphoplasty   Medication [facility]. [facility]  Physical exam BP 134/70  Pulse 88  Temp(Src) 97.8 F (36.6 C)  Resp 18  Ht 5\' 6"  (1.676 m)  Wt 116 lb 12.8 oz (52.98 kg)  BMI 18.86 kg/m2  SpO2 97%  General- elderly female in no acute distress Head- atraumatic, normocephalic Eyes- PERRLA, EOMI Neck- no thyromegaly, no jugular vein distension Mouth- normal mucus membrane Cardiovascular- normal s1,s2, no murmurs, diminished pedal pulses, no edema Respiratory- bilateral clear to auscultation, no wheeze, no rhonchi, no crackles Abdomen- bowel sounds present, soft, non tender Musculoskeletal: left hemiparesis, normal ROM on right side. Assist with transfers. Needs to be pushed in wheelchair Lymphadenopathy: She has no cervical adenopathy.  Neurological: She is alert and oriented to person, place, and time.   Skin: Skin is warm and dry.  gerisleeves in place Psychiatric: She has a normal mood and affect. Her behavior is normal.   Labs reviewed-  01/10/14 glu 85, bun 16, cr 0.84, na 140, k 3.8, ca 9.3, ast 1174, alt 497, alp 385, t.bil 0.9, alb 3.5, wbc 9.5, hb 12.3, hct 38.3, plt 204,neutrophil 7.5 01/11/14 t.pro 5.7, alb 3, t.bil 0.9,  alp 264, ast 414, alt 324 01/10/14 urine- cloudy but negative for elukocytes and nitrites; no growth in culture 05/12/14- iron 51, ferritin 29, b12 1500, wbc 6.2, hb 10.4, hct 34.9, mcv 95.1, plt 288, na 138, k 4.2, bun 23, cr 0.8, glu 74, ca 9.4 05/28/14 na 136, k 4.5, bun 22, cr 0.8, glu 85, ca 9.6 05/30/14 wbc 7.7, hb 10.7, hct 35.5, plt 256    Assessment/Plan  Dysphagia S/p CVA, continue mechanical soft diet  with thin liquids, aspiration precautions  Protein calorie malnutrition Continue mighty shakes, weight has been stable, has gained few pounds. Continue remeron  CVA with hemiparesis Continue lavix, bp stable, off all bp meds. Continue restorative therapy  Temporal arteritis Continue her low dose prednisone 1 mg daily  Depression Mood stable, continue desvenlafaxine  Hypothyroidism Continue levothyroxine 25 mcg daily and monitor thyroid panel. s/p partial thyroidectomy  Osteoporosis Continue prolia injections, ca-vit d, fall precautions, continue to work with therapy team  gerd On prilosec 20 mg bid  Hypomagnesemia Continue magnesium oxide supplement   Chronic pain Stable on tylenol, neurontin and duragesic. Monitor bowel movement. Continue miralax  Annual exam uptodate with influenza and pneumococcal vaccine. uptodate with eye exam 03/08/14 and podiatry exam 12/15/13.

## 2014-08-01 ENCOUNTER — Other Ambulatory Visit: Payer: Self-pay | Admitting: *Deleted

## 2014-08-01 MED ORDER — FENTANYL 25 MCG/HR TD PT72: MEDICATED_PATCH | TRANSDERMAL | Status: DC

## 2014-08-01 NOTE — Telephone Encounter (Signed)
[facility] 

## 2014-08-15 ENCOUNTER — Non-Acute Institutional Stay (SKILLED_NURSING_FACILITY): Payer: PRIVATE HEALTH INSURANCE | Admitting: Internal Medicine

## 2014-08-15 DIAGNOSIS — E890 Postprocedural hypothyroidism: Secondary | ICD-10-CM

## 2014-08-15 DIAGNOSIS — M810 Age-related osteoporosis without current pathological fracture: Secondary | ICD-10-CM

## 2014-08-15 DIAGNOSIS — F331 Major depressive disorder, recurrent, moderate: Secondary | ICD-10-CM

## 2014-08-15 DIAGNOSIS — E440 Moderate protein-calorie malnutrition: Secondary | ICD-10-CM

## 2014-08-15 NOTE — Progress Notes (Signed)
Patient ID: Erica French, female   DOB: 05-18-1930, 80 y.o.   MRN: [id number]    [facility]  Chief complaint- routine visit  Allergies reviewed  Code status- DNR  HPI 80 y/o female patient is seen today for annual visit. She is at her baseline with no no new nursing concerns. She is lying in her bed and is reading newspaper. She had a good thanksgiving and was away with her family that day. Mood stable She has hx of HTN, temporal arteritis, CVA with hemiplegia, PVD, GERD, COPD, osteoporosis   Review of Systems   Constitutional: Negative for fever, chills, malaise/fatigue and diaphoresis.   HENT: Negative for congestion   Respiratory: Negative for cough, sputum production, shortness of breath and wheezing.    Cardiovascular: Negative for chest pain, palpitations.   Gastrointestinal: Negative for heartburn, nausea, vomiting, abdominal pain.   Genitourinary: Negative for dysuria.   Musculoskeletal: Negative for falls.   Skin: Negative for itching and rash.   Neurological: Negative for dizziness, tingling, headaches.   Psychiatric/Behavioral: Negative for depression.  Past Medical History  Diagnosis Date  . COPD (chronic obstructive pulmonary disease)   . Anxiety   . Hypertension   . Depression   . Thyroid nodule   . Headache(784.0)     temple artiritis  . Stroke     left sided weakness- transfers with walke rand assiast  . Pneumonia 05/2012  . H/O arteritis     Temporal  . Gastritis    Medication [facility]. [facility]  Physical exam BP 123/67 mmHg  Pulse 80  Temp(Src) 98.2 F (36.8 C)  Resp 16  SpO2 94%  General- elderly female in no acute distress Head- atraumatic, normocephalic Eyes- PERRLA, EOMI Neck- no thyromegaly, no jugular vein distension Mouth- normal mucus membrane Cardiovascular- normal s1,s2, no murmurs, diminished pedal pulses, no edema Respiratory- bilateral clear to auscultation, no wheeze, no  rhonchi, no crackles Abdomen- bowel sounds present, soft, non tender Musculoskeletal: left hemiparesis, normal ROM on right side. Assist with transfers. Needs to be pushed in wheelchair Lymphadenopathy: She has no cervical adenopathy.  Neurological: She is alert and oriented to person, place, and time.   Skin: Skin is warm and dry.  gerisleeves in place Psychiatric: She has a normal mood and affect. Her behavior is normal.   Labs reviewed-  01/10/14 glu 85, bun 16, cr 0.84, na 140, k 3.8, ca 9.3, ast 1174, alt 497, alp 385, t.bil 0.9, alb 3.5, wbc 9.5, hb 12.3, hct 38.3, plt 204,neutrophil 7.5 01/11/14 t.pro 5.7, alb 3, t.bil 0.9, alp 264, ast 414, alt 324 01/10/14 urine- cloudy but negative for elukocytes and nitrites; no growth in culture 05/12/14- iron 51, ferritin 29, b12 1500, wbc 6.2, hb 10.4, hct 34.9, mcv 95.1, plt 288, na 138, k 4.2, bun 23, cr 0.8, glu 74, ca 9.4 05/28/14 na 136, k 4.5, bun 22, cr 0.8, glu 85, ca 9.6 05/30/14 wbc 7.7, hb 10.7, hct 35.5, plt 256    Assessment/Plan  Depression Mood stable, continue pristiq and remeron for now.   Protein calorie malnutrition Continue remeron and mighty shakes, weight has been stable. No skin breakdown  Hypothyroidism Continue levothyroxine 25 mcg daily and monitor thyroid panel. s/p partial thyroidectomy  Osteoporosis Continue ca-vit d, fall precautions, continue to work with therapy team, prolia d/c due to denial by insurance

## 2014-08-29 ENCOUNTER — Other Ambulatory Visit: Payer: Self-pay | Admitting: *Deleted

## 2014-08-29 MED ORDER — FENTANYL 25 MCG/HR TD PT72: MEDICATED_PATCH | TRANSDERMAL | Status: DC

## 2014-08-29 NOTE — Telephone Encounter (Signed)
[facility] 

## 2014-09-12 ENCOUNTER — Non-Acute Institutional Stay (SKILLED_NURSING_FACILITY): Payer: PRIVATE HEALTH INSURANCE | Admitting: Internal Medicine

## 2014-09-12 DIAGNOSIS — E440 Moderate protein-calorie malnutrition: Secondary | ICD-10-CM

## 2014-09-12 DIAGNOSIS — K2100 Gastro-esophageal reflux disease with esophagitis, without bleeding: Secondary | ICD-10-CM

## 2014-09-12 DIAGNOSIS — H04123 Dry eye syndrome of bilateral lacrimal glands: Secondary | ICD-10-CM

## 2014-09-12 DIAGNOSIS — M150 Primary generalized (osteo)arthritis: Secondary | ICD-10-CM

## 2014-09-21 DIAGNOSIS — H04123 Dry eye syndrome of bilateral lacrimal glands: Secondary | ICD-10-CM | POA: Insufficient documentation

## 2014-09-21 DIAGNOSIS — E440 Moderate protein-calorie malnutrition: Secondary | ICD-10-CM | POA: Insufficient documentation

## 2014-09-21 DIAGNOSIS — M150 Primary generalized (osteo)arthritis: Secondary | ICD-10-CM | POA: Insufficient documentation

## 2014-09-21 NOTE — Progress Notes (Signed)
Patient ID: Erica French, female   DOB: November 24, 1929, 81 y.o.   MRN: [id number]    [facility]  Chief complaint- routine visit  Allergies reviewed  HPI 81 y/o female patient is seen today for routine visit. She has been complaining of dry eyes. She is at her baseline with no other nursing concerns. She is lying in her bed and is reading newspaper. Weight stable, infact gained some. Tolerating magic cup well. Mood stable She has hx of HTN, temporal arteritis, CVA with hemiplegia, PVD, GERD, COPD, osteoporosis   Review of Systems   Constitutional: Negative for fever, chills, malaise/fatigue and diaphoresis.   HENT: Negative for congestion   Respiratory: Negative for cough, sputum production, shortness of breath and wheezing.    Cardiovascular: Negative for chest pain, palpitations.   Gastrointestinal: Negative for heartburn, nausea, vomiting, abdominal pain.   Genitourinary: Negative for dysuria.   Musculoskeletal: Negative for falls.   Skin: Negative for itching and rash.   Neurological: Negative for dizziness, tingling, headaches.   Psychiatric/Behavioral: Negative for depression.  Past Medical History  Diagnosis Date  . COPD (chronic obstructive pulmonary disease)   . Anxiety   . Hypertension   . Depression   . Thyroid nodule   . Headache(784.0)     temple artiritis  . Stroke     left sided weakness- transfers with walke rand assiast  . Pneumonia 05/2012  . H/O arteritis     Temporal  . Gastritis    Med[facility]. [facility]  Physical exam BP 132/60 mmHg  Pulse 88  Temp(Src) 98 F (36.7 C)  Resp 18  SpO2 97%  General- elderly thin built female in no acute distress Head- atraumatic, normocephalic Eyes- PERRLA, EOMI Neck- no thyromegaly, no jugular vein distension Mouth- normal mucus membrane Cardiovascular- normal s1,s2, no murmurs, diminished pedal pulses, no edema Respiratory- bilateral clear to  auscultation, no wheeze, no rhonchi, no crackles Abdomen- bowel sounds present, soft, non tender Musculoskeletal: left hemiparesis, normal ROM on right side. Assist with transfers. Needs to be pushed in wheelchair Lymphadenopathy: She has no cervical adenopathy.  Neurological: She is alert and oriented to person, place, and time.   Skin: Skin is warm and dry.  gerisleeves in place Psychiatric: She has a normal mood and affect. Her behavior is normal.   Labs reviewed-  01/10/14 glu 85, bun 16, cr 0.84, na 140, k 3.8, ca 9.3, ast 1174, alt 497, alp 385, t.bil 0.9, alb 3.5, wbc 9.5, hb 12.3, hct 38.3, plt 204,neutrophil 7.5 01/11/14 t.pro 5.7, alb 3, t.bil 0.9, alp 264, ast 414, alt 324 01/10/14 urine- cloudy but negative for elukocytes and nitrites; no growth in culture 05/12/14- iron 51, ferritin 29, b12 1500, wbc 6.2, hb 10.4, hct 34.9, mcv 95.1, plt 288, na 138, k 4.2, bun 23, cr 0.8, glu 74, ca 9.4 05/28/14 na 136, k 4.5, bun 22, cr 0.8, glu 85, ca 9.6 05/30/14 wbc 7.7, hb 10.7, hct 35.5, plt 256 06/28/14 tsh 2.69 08/22/14 wbc 7.4, hb 11.9, hct 35.2, plt 259, na 138, k 4.6, bun 28, cr 0.8, glu 88, ca 9.9  Assessment/plan  Dry eyes Add artificial tear eye drops to both eyes bid  Protein calorie malnutrition Weight gained, also tolerating magic cup well, monitor weight ad encourage po intake  gerd with esophagitis Stable, on protonix 20 mg bid, continue for now  OA Fentanyl patch and neurontin have been helpful, continue this

## 2014-09-30 ENCOUNTER — Other Ambulatory Visit: Payer: Self-pay | Admitting: *Deleted

## 2014-09-30 MED ORDER — FENTANYL 25 MCG/HR TD PT72: MEDICATED_PATCH | TRANSDERMAL | Status: DC

## 2014-09-30 NOTE — Telephone Encounter (Signed)
[facility] 

## 2014-10-31 ENCOUNTER — Other Ambulatory Visit: Payer: Self-pay | Admitting: *Deleted

## 2014-10-31 MED ORDER — FENTANYL 25 MCG/HR TD PT72: MEDICATED_PATCH | TRANSDERMAL | Status: DC

## 2014-10-31 NOTE — Telephone Encounter (Signed)
[facility] 

## 2014-11-17 ENCOUNTER — Non-Acute Institutional Stay (SKILLED_NURSING_FACILITY): Payer: Medicare Other | Admitting: Internal Medicine

## 2014-11-17 DIAGNOSIS — E890 Postprocedural hypothyroidism: Secondary | ICD-10-CM

## 2014-11-17 DIAGNOSIS — M810 Age-related osteoporosis without current pathological fracture: Secondary | ICD-10-CM | POA: Diagnosis not present

## 2014-11-17 DIAGNOSIS — I10 Essential (primary) hypertension: Secondary | ICD-10-CM | POA: Diagnosis not present

## 2014-11-17 DIAGNOSIS — D519 Vitamin B12 deficiency anemia, unspecified: Secondary | ICD-10-CM

## 2014-12-05 ENCOUNTER — Other Ambulatory Visit: Payer: Self-pay | Admitting: *Deleted

## 2014-12-05 MED ORDER — FENTANYL 25 MCG/HR TD PT72: MEDICATED_PATCH | TRANSDERMAL | Status: DC

## 2014-12-05 NOTE — Telephone Encounter (Signed)
[facility] 

## 2015-01-02 DIAGNOSIS — E890 Postprocedural hypothyroidism: Secondary | ICD-10-CM | POA: Insufficient documentation

## 2015-01-02 DIAGNOSIS — I10 Essential (primary) hypertension: Secondary | ICD-10-CM | POA: Insufficient documentation

## 2015-01-02 DIAGNOSIS — D519 Vitamin B12 deficiency anemia, unspecified: Secondary | ICD-10-CM | POA: Insufficient documentation

## 2015-01-02 NOTE — Progress Notes (Signed)
Patient ID: Erica French, female   DOB: 22-Nov-1929, 81 y.o.   MRN: [id number]    [facility]  Chief complaint- routine visit  Allergies reviewed  HPI 81 y/o female patient is seen today for routine visit. She has been more out of bed and particpating in group activities lately. She had pneumonia in jan 2016 and was treated for it. Denies any concerns today. She has hx of HTN, temporal arteritis, CVA with hemiplegia, PVD, GERD, COPD, osteoporosis   Review of Systems   Constitutional: Negative for fever, chills, diaphoresis.   HENT: Negative for congestion   Respiratory: Negative for cough, sputum production, shortness of breath and wheezing.    Cardiovascular: Negative for chest pain, palpitations.   Gastrointestinal: Negative for heartburn, nausea, vomiting, abdominal pain.   Genitourinary: Negative for dysuria.   Musculoskeletal: Negative for falls.   Skin: Negative for itching and rash.   Neurological: Negative for dizziness, tingling, headaches.   Psychiatric/Behavioral: Negative for depression.  Past medical history reviewed  [facility]. [facility]  Physical exam BP 134/76 mmHg  Pulse 70  Temp(Src) 98 F (36.7 C)  Resp 18  General- elderly thin built female in no acute distress Head- atraumatic, normocephalic Eyes- PERRLA, EOMI Neck- no thyromegaly, no jugular vein distension Mouth- normal mucus membrane Cardiovascular- normal s1,s2, no murmurs, diminished pedal pulses, no edema Respiratory- bilateral clear to auscultation, no wheeze, no rhonchi, no crackles Abdomen- bowel sounds present, soft, non tender Musculoskeletal: left hemiparesis, normal ROM on right side. Assist with transfers. Needs to be pushed in wheelchair Lymphadenopathy: She has no cervical adenopathy.  Neurological: She is alert and oriented to person, place, and time.   Skin: Skin is warm and dry.  gerisleeves in place Psychiatric: She has  a normal mood and affect. Her behavior is normal.    Labs reviewed  01/10/14 glu 85, bun 16, cr 0.84, na 140, k 3.8, ca 9.3, ast 1174, alt 497, alp 385, t.bil 0.9, alb 3.5, wbc 9.5, hb 12.3, hct 38.3, plt 204,neutrophil 7.5 01/11/14 t.pro 5.7, alb 3, t.bil 0.9, alp 264, ast 414, alt 324 01/10/14 urine- cloudy but negative for elukocytes and nitrites; no growth in culture 05/12/14- iron 51, ferritin 29, b12 1500, wbc 6.2, hb 10.4, hct 34.9, mcv 95.1, plt 288, na 138, k 4.2, bun 23, cr 0.8, glu 74, ca 9.4 05/28/14 na 136, k 4.5, bun 22, cr 0.8, glu 85, ca 9.6 05/30/14 wbc 7.7, hb 10.7, hct 35.5, plt 256 06/28/14 tsh 2.69 08/22/14 wbc 7.4, hb 11.9, hct 35.2, plt 259, na 138, k 4.6, bun 28, cr 0.8, glu 88, ca 9.9 10/11/14 wbc 8.9, hb 11, plt 305, na 138, k 4.7, bun 18, cr 0.8, gluc 79   Assessment/plan  Hypothyroidism S/p thyroidectomy, continue levothyroxine 25 mcg daily  Age related osteoporosis Continue oyster shell calcium vitamin d3 with additional vitamin d supplement  b12 def Continue b12 supplemwnt  HTN Stable, off all meds for now

## 2015-01-04 ENCOUNTER — Other Ambulatory Visit: Payer: Self-pay | Admitting: *Deleted

## 2015-01-04 MED ORDER — FENTANYL 25 MCG/HR TD PT72: MEDICATED_PATCH | TRANSDERMAL | Status: DC

## 2015-01-11 ENCOUNTER — Non-Acute Institutional Stay (SKILLED_NURSING_FACILITY): Payer: Medicare Other | Admitting: Internal Medicine

## 2015-01-11 DIAGNOSIS — F329 Major depressive disorder, single episode, unspecified: Secondary | ICD-10-CM

## 2015-01-11 DIAGNOSIS — I6350 Cerebral infarction due to unspecified occlusion or stenosis of unspecified cerebral artery: Secondary | ICD-10-CM

## 2015-01-11 DIAGNOSIS — K219 Gastro-esophageal reflux disease without esophagitis: Secondary | ICD-10-CM

## 2015-01-11 DIAGNOSIS — R634 Abnormal weight loss: Secondary | ICD-10-CM | POA: Diagnosis not present

## 2015-01-11 DIAGNOSIS — D638 Anemia in other chronic diseases classified elsewhere: Secondary | ICD-10-CM | POA: Insufficient documentation

## 2015-01-11 DIAGNOSIS — M316 Other giant cell arteritis: Secondary | ICD-10-CM | POA: Diagnosis not present

## 2015-01-11 DIAGNOSIS — J449 Chronic obstructive pulmonary disease, unspecified: Secondary | ICD-10-CM

## 2015-01-11 DIAGNOSIS — IMO0002 Reserved for concepts with insufficient information to code with codable children: Secondary | ICD-10-CM

## 2015-01-11 NOTE — Progress Notes (Signed)
Patient ID: Erica French, female   DOB: 1930/03/27, 81 y.o.   MRN: [id number]     [facility]  Chief complaint- routine visit  Allergies reviewed  Code status- DNR  HPI 81 y/o female patient is seen today for routine visit. She mentions feeling low and is tired of being in her room all day. Denies any other complaints. She has hx of HTN, temporal arteritis, CVA with hemiplegia, PVD, GERD, COPD, osteoporosis   Review of Systems   Constitutional: Negative for fever, chills, diaphoresis.   HENT: Negative for congestion   Respiratory: Negative for cough, sputum production, shortness of breath and wheezing.    Cardiovascular: Negative for chest pain, palpitations.   Gastrointestinal: Negative for heartburn, nausea, vomiting, abdominal pain.   Genitourinary: Negative for dysuria.   Musculoskeletal: Negative for falls.   Skin: Negative for itching and rash.   Neurological: Negative for dizziness, tingling, headaches.   Psychiatric/Behavioral: Negative for depression.  Past medical history reviewed  [facility]ication [facility]. [facility]   Medication List       This list is accurate as of: 01/11/15  3:30 PM.  Always use your most recent med list.               calcium-vitamin D 500-200 MG-UNIT per tablet  Commonly known as:  OSCAL WITH D  Take 1 tablet by mouth 2 (two) times daily.     cholecalciferol 1000 UNITS tablet  Commonly known as:  VITAMIN D  Take 2,000 Units by mouth daily.     clopidogrel 75 MG tablet  Commonly known as:  PLAVIX  Take 75 mg by mouth daily.     Cranberry 200 MG Caps  Take 400 mg by mouth 2 (two) times daily.     desvenlafaxine 50 MG 24 hr tablet  Commonly known as:  PRISTIQ  Take 50 mg by mouth daily.     fentaNYL 25 MCG/HR patch  Commonly known as:  DURAGESIC - dosed mcg/hr  Apply one patch every 72 hours for pain. Remove old patch. External Use only. Rotate sites. Check placement of patch every  shift     Fluticasone-Salmeterol 250-50 MCG/DOSE Aepb  Commonly known as:  ADVAIR  Inhale 1 puff into the lungs 2 (two) times daily.     folic acid 0.5 MG tablet  Commonly known as:  FOLVITE  Take 1 mg by mouth daily.     gabapentin 100 MG capsule  Commonly known as:  NEURONTIN  Take 100 mg by mouth 3 (three) times daily.     levothyroxine 25 MCG tablet  Commonly known as:  SYNTHROID, LEVOTHROID  Take 25 mcg by mouth daily before breakfast.     magnesium oxide 400 MG tablet  Commonly known as:  MAG-OX  Take 400 mg by mouth daily.     mirtazapine 7.5 MG tablet  Commonly known as:  REMERON  Take 15 mg by mouth at bedtime.     pantoprazole 40 MG tablet  Commonly known as:  PROTONIX  Take 20 mg by mouth 2 (two) times daily.     polyethylene glycol powder powder  Commonly known as:  GLYCOLAX/MIRALAX  Take 17 g by mouth daily.     polyvinyl alcohol 1.4 % ophthalmic solution  Commonly known as:  LIQUIFILM TEARS  Place 1 drop into both eyes as needed for dry eyes.     predniSONE 1 MG tablet  Commonly known as:  DELTASONE  Take 1  tablet (1 mg total) by mouth daily.     vitamin B-12 1000 MCG tablet  Commonly known as:  CYANOCOBALAMIN  Take 1,000 mcg by mouth daily.        Physical exam BP 119/64 mmHg  Pulse 80  Temp(Src) 98.2 F (36.8 C)  Resp 18  Ht  (1.676 m)  Wt 135 lb 8 oz (61.462 kg)  BMI 21.88 kg/m2  SpO2 95%  Wt Readings from Last 3 Encounters:  01/11/15 135 lb 8 oz (61.462 kg)  07/11/14 116 lb 12.8 oz (52.98 kg)  01/13/14 100 lb (45.36 kg)    General- elderly female in no acute distress Head- atraumatic, normocephalic Eyes- PERRLA, EOMI Neck- no thyromegaly, no jugular vein distension Mouth- normal mucus membrane Cardiovascular- normal s1,s2, no murmurs, diminished pedal pulses, no edema Respiratory- bilateral clear to auscultation, no wheeze, no rhonchi, no crackles Abdomen- bowel sounds present, soft, non tender Musculoskeletal: left  hemiparesis, normal ROM on right side. Assist with transfers. Needs to be pushed in wheelchair Lymphadenopathy: She has no cervical adenopathy.  Neurological: She is alert and oriented to person, place, and time.   Skin: Skin is warm and dry.  gerisleeves in place Psychiatric: She has a normal mood and affect. Her behavior is normal.    Labs reviewed  01/10/14 glu 85, bun 16, cr 0.84, na 140, k 3.8, ca 9.3, ast 1174, alt 497, alp 385, t.bil 0.9, alb 3.5, wbc 9.5, hb 12.3, hct 38.3, plt 204,neutrophil 7.5 01/11/14 t.pro 5.7, alb 3, t.bil 0.9, alp 264, ast 414, alt 324 01/10/14 urine- cloudy but negative for elukocytes and nitrites; no growth in culture 05/12/14- iron 51, ferritin 29, b12 1500, wbc 6.2, hb 10.4, hct 34.9, mcv 95.1, plt 288, na 138, k 4.2, bun 23, cr 0.8, glu 74, ca 9.4 05/28/14 na 136, k 4.5, bun 22, cr 0.8, glu 85, ca 9.6 05/30/14 wbc 7.7, hb 10.7, hct 35.5, plt 256 06/28/14 tsh 2.69 08/22/14 wbc 7.4, hb 11.9, hct 35.2, plt 259, na 138, k 4.6, bun 28, cr 0.8, glu 88, ca 9.9 10/11/14 wbc 8.9, hb 11, plt 305, na 138, k 4.7, bun 18, cr 0.8, gluc 79 12/12/14 wbc 9.1, hb 10, hct 31.3, plt 318, na 138, k 4.4, bun 28, cr 1.31, glu 75 12/20/14 wbc 8.1, hb 9.8, mcv 88.6, plt 372   Assessment/plan  gerd Stable, continue protonix 20 mg bid with prn zofran  Depression Continue pristiq 50 mg daily, will have activities take her out in geri chair to the courtyard daily to be outside in nice weather. Pt does not want to eat meals with other residents. Encouraged group activity participation  Giant cell arteritis Stable, on chronic prednisone 1 mg daily  Copd  stable, on room air, continue prn duonebs and advair daily  Weight loss Stable, has gained weight, appetite is good. Is on small dosing of remeron for appetite stimulation. D/c remeron for now  Anemia of chronic disease Her impaired renal function with long standing HTN could be contributing to this along with remeron. D/c  remeron

## 2015-01-31 ENCOUNTER — Other Ambulatory Visit: Payer: Self-pay | Admitting: *Deleted

## 2015-01-31 MED ORDER — FENTANYL 25 MCG/HR TD PT72: MEDICATED_PATCH | TRANSDERMAL | Status: DC

## 2015-01-31 NOTE — Telephone Encounter (Signed)
[name] 

## 2015-02-03 ENCOUNTER — Non-Acute Institutional Stay (SKILLED_NURSING_FACILITY): Payer: Medicare Other | Admitting: Internal Medicine

## 2015-02-03 DIAGNOSIS — E039 Hypothyroidism, unspecified: Secondary | ICD-10-CM

## 2015-02-03 DIAGNOSIS — R630 Anorexia: Secondary | ICD-10-CM | POA: Diagnosis not present

## 2015-02-03 DIAGNOSIS — F322 Major depressive disorder, single episode, severe without psychotic features: Secondary | ICD-10-CM | POA: Diagnosis not present

## 2015-02-03 DIAGNOSIS — F329 Major depressive disorder, single episode, unspecified: Secondary | ICD-10-CM

## 2015-02-03 DIAGNOSIS — R5383 Other fatigue: Secondary | ICD-10-CM

## 2015-02-03 DIAGNOSIS — F32A Depression, unspecified: Secondary | ICD-10-CM

## 2015-02-03 NOTE — Progress Notes (Signed)
Patient ID: Erica French, female   DOB: 1930-02-04, 81 y.o.   MRN: [id number]      Facility: [facility]  Chief complaint- routine visit  Allergies reviewed  Code status- DNR  HPI 81 y/o female patient is seen today for routine visit. She mentions she is depressed. Denies any suicidal thoughts but feels tearful and sad. Her energy level is low and she has poor appetite. Denies any other complaints. She has pmh of HTN, temporal arteritis, CVA with hemiplegia, PVD, GERD, COPD, osteoporosis   Review of Systems   Constitutional: Negative for fever, chills, diaphoresis.   HENT: Negative for congestion   Respiratory: Negative for cough, sputum production, shortness of breath and wheezing.    Cardiovascular: Negative for chest pain, palpitations.   Gastrointestinal: Negative for heartburn, nausea, vomiting, abdominal pain.   Genitourinary: Negative for dysuria.   Musculoskeletal: Negative for falls.   Skin: Negative for itching and rash.   Neurological: Negative for dizziness, tingling, headaches.   Psychiatric/Behavioral: positive for depression.  Past medical history reviewed  Medication reviewed. See [facility]   Medication List       This list is accurate as of: 02/03/15  3:29 PM.  Always use your most recent med list.               calcium-vitamin D 500-200 MG-UNIT per tablet  Commonly known as:  OSCAL WITH D  Take 1 tablet by mouth 2 (two) times daily.     cholecalciferol 1000 UNITS tablet  Commonly known as:  VITAMIN D  Take 2,000 Units by mouth daily.     clopidogrel 75 MG tablet  Commonly known as:  PLAVIX  Take 75 mg by mouth daily.     Cranberry 200 MG Caps  Take 400 mg by mouth 2 (two) times daily.     desvenlafaxine 50 MG 24 hr tablet  Commonly known as:  PRISTIQ  Take 50 mg by mouth daily.     fentaNYL 25 MCG/HR patch  Commonly known as:  DURAGESIC - dosed mcg/hr  Apply one patch every 72 hours for pain. Remove  old patch. External Use only. Rotate sites. Check placement of patch every shift     Fluticasone-Salmeterol 250-50 MCG/DOSE Aepb  Commonly known as:  ADVAIR  Inhale 1 puff into the lungs 2 (two) times daily.     folic acid 0.5 MG tablet  Commonly known as:  FOLVITE  Take 1 mg by mouth daily.     gabapentin 100 MG capsule  Commonly known as:  NEURONTIN  Take 100 mg by mouth 3 (three) times daily.     levothyroxine 25 MCG tablet  Commonly known as:  SYNTHROID, LEVOTHROID  Take 25 mcg by mouth daily before breakfast.     magnesium oxide 400 MG tablet  Commonly known as:  MAG-OX  Take 400 mg by mouth daily.     mirtazapine 7.5 MG tablet  Commonly known as:  REMERON  Take 15 mg by mouth at bedtime.     pantoprazole 40 MG tablet  Commonly known as:  PROTONIX  Take 20 mg by mouth 2 (two) times daily.     polyethylene glycol powder powder  Commonly known as:  GLYCOLAX/MIRALAX  Take 17 g by mouth daily.     polyvinyl alcohol 1.4 % ophthalmic solution  Commonly known as:  LIQUIFILM TEARS  Place 1 drop into both eyes as needed for dry eyes.     predniSONE 1  MG tablet  Commonly known as:  DELTASONE  Take 1 tablet (1 mg total) by mouth daily.     vitamin B-12 1000 MCG tablet  Commonly known as:  CYANOCOBALAMIN  Take 1,000 mcg by mouth daily.        Physical exam BP 100/71 mmHg  Pulse 85  Temp(Src) 97.7 F (36.5 C)  Resp 18  Ht 5\' 6"  (1.676 m)  Wt 132 lb 9.6 oz (60.147 kg)  BMI 21.41 kg/m2  Wt Readings from Last 3 Encounters:  02/03/15 132 lb 9.6 oz (60.147 kg)  01/11/15 135 lb 8 oz (61.462 kg)  07/11/14 116 lb 12.8 oz (52.98 kg)    General- elderly female in no acute distress Head- atraumatic, normocephalic Eyes- PERRLA, EOMI Neck- no thyromegaly, no jugular vein distension Mouth- normal mucus membrane Cardiovascular- normal s1,s2, no murmurs, diminished pedal pulses, no edema Respiratory- bilateral clear to auscultation, no wheeze, no rhonchi, no  crackles Abdomen- bowel sounds present, soft, non tender Musculoskeletal: left hemiparesis, normal ROM on right side. Assist with transfers. Needs to be pushed in wheelchair Lymphadenopathy: She has no cervical adenopathy.  Neurological: She is alert and oriented to person, place, and time.   Skin: Skin is warm and dry.  gerisleeves in place Psychiatric: She has flat affect. Her behavior is normal.    Labs reviewed  01/10/14 glu 85, bun 16, cr 0.84, na 140, k 3.8, ca 9.3, ast 1174, alt 497, alp 385, t.bil 0.9, alb 3.5, wbc 9.5, hb 12.3, hct 38.3, plt 204,neutrophil 7.5 01/11/14 t.pro 5.7, alb 3, t.bil 0.9, alp 264, ast 414, alt 324 01/10/14 urine- cloudy but negative for elukocytes and nitrites; no growth in culture 05/12/14- iron 51, ferritin 29, b12 1500, wbc 6.2, hb 10.4, hct 34.9, mcv 95.1, plt 288, na 138, k 4.2, bun 23, cr 0.8, glu 74, ca 9.4 05/28/14 na 136, k 4.5, bun 22, cr 0.8, glu 85, ca 9.6 05/30/14 wbc 7.7, hb 10.7, hct 35.5, plt 256 06/28/14 tsh 2.69 08/22/14 wbc 7.4, hb 11.9, hct 35.2, plt 259, na 138, k 4.6, bun 28, cr 0.8, glu 88, ca 9.9 10/11/14 wbc 8.9, hb 11, plt 305, na 138, k 4.7, bun 18, cr 0.8, gluc 79 12/12/14 wbc 9.1, hb 10, hct 31.3, plt 318, na 138, k 4.4, bun 28, cr 1.31, glu 75 12/20/14 wbc 8.1, hb 9.8, mcv 88.6, plt 372   Assessment/plan  Depression Continue pristiq 50 mg daily for now and increase remeron to 30 mg daily. This should help both with her mood and appetite. Reassess  Poor appetite Her depression could be causing this. Also has lost 3 lbs since last visit. remeron has been re-started, monitor weight  Fatigue Her depression could be mainly contributing to this along with her anemia of chronic disease and age. Check thyroid function given hx of hypothyroidism  Hypothyroidism Check tsh.continue levothyroxine 25 mcg daily  Oneal GroutMAHIMA Mckinnley Smithey, MD  [facility] [phone] (Monday-Friday 8 am - 5 pm) [phone] (afterhours)

## 2015-02-06 LAB — TSH: TSH: 1.02 u[IU]/mL (ref <=5.90)

## 2015-02-23 ENCOUNTER — Non-Acute Institutional Stay (SKILLED_NURSING_FACILITY): Payer: Medicare Other | Admitting: Internal Medicine

## 2015-02-23 DIAGNOSIS — R0782 Intercostal pain: Secondary | ICD-10-CM

## 2015-02-23 NOTE — Progress Notes (Signed)
Patient ID: Erica French, female   DOB: 07/28/1930, 81 y.o.   MRN: [id number]      Facility: [facility]  Allergies reviewed  Code status- DNR  Chief Complaint  Patient presents with  . Acute Visit    chest pain    HPI 81 y/o female patient is seen today for acute visit. She has been having chest pain on the central area and left side for a week. She mentions it started post near fall when a staff supported her and prevented her fall. At that point she experienced a twitching in her chest area. Pain is constant but prominent and worse with Coughing and movement and deep breathing. Denies any radiation of the pain. Chest xray was done showing no acute abnormality. Denies any other concerns. Currently taking tylenol for pain without relief.  She has pmh of HTN, temporal arteritis, CVA with hemiplegia, PVD, GERD, COPD, osteoporosis   Review of Systems   Constitutional: Negative for fever, chills, diaphoresis.   HENT: Negative for congestion   Respiratory: Negative for cough, sputum production, shortness of breath and wheezing.    Cardiovascular: Negative for palpitations.   Gastrointestinal: Negative for heartburn, nausea, vomiting, abdominal pain.   Genitourinary: Negative for dysuria.   Musculoskeletal: Negative for falls.   Skin: Negative for itching and rash.   Neurological: Negative for dizziness, tingling in her extremities. No headache     Past medical history reviewed  Medication reviewed. See [facility]  Physical exam BP 124/70 mmHg  Pulse 68  Temp(Src) 98 F (36.7 C)  Resp 16  SpO2 95%  General- elderly female in no acute distress Head- atraumatic, normocephalic Neck- no thyromegaly, no jugular vein distension Mouth- normal mucus membrane Chest- reproducible pain in right 4th, 5th and sixth ribs and intercostal space and sternal area Cardiovascular- normal s1,s2, no murmurs, diminished pedal pulses, no edema Respiratory-  bilateral clear to auscultation, no wheeze, no rhonchi, no crackles Abdomen- bowel sounds present, soft, non tender Musculoskeletal: left hemiparesis, normal ROM on right side. Assist with transfers. Needs to be pushed in wheelchair Lymphadenopathy: She has no cervical adenopathy.  Neurological: She is alert and oriented to person, place, and time.   Skin: Skin is warm and dry.  gerisleeves in place Psychiatric: She has flat affect. Her behavior is normal.    Labs reviewed  01/10/14 glu 85, bun 16, cr 0.84, na 140, k 3.8, ca 9.3, ast 1174, alt 497, alp 385, t.bil 0.9, alb 3.5, wbc 9.5, hb 12.3, hct 38.3, plt 204,neutrophil 7.5 01/11/14 t.pro 5.7, alb 3, t.bil 0.9, alp 264, ast 414, alt 324 01/10/14 urine- cloudy but negative for elukocytes and nitrites; no growth in culture 05/12/14- iron 51, ferritin 29, b12 1500, wbc 6.2, hb 10.4, hct 34.9, mcv 95.1, plt 288, na 138, k 4.2, bun 23, cr 0.8, glu 74, ca 9.4 05/28/14 na 136, k 4.5, bun 22, cr 0.8, glu 85, ca 9.6 05/30/14 wbc 7.7, hb 10.7, hct 35.5, plt 256 06/28/14 tsh 2.69 08/22/14 wbc 7.4, hb 11.9, hct 35.2, plt 259, na 138, k 4.6, bun 28, cr 0.8, glu 88, ca 9.9 10/11/14 wbc 8.9, hb 11, plt 305, na 138, k 4.7, bun 18, cr 0.8, gluc 79 12/12/14 wbc 9.1, hb 10, hct 31.3, plt 318, na 138, k 4.4, bun 28, cr 1.31, glu 75 12/20/14 wbc 8.1, hb 9.8, mcv 88.6, plt 372 02/06/15 tsh 1.021   Assessment/plan  Chest pain Appears to be musculoskeletal in nature  with it being reproducible. Will get xray of the left ribs to rule out fracture. Naproxen 500 mg bid x 5 days with ice pack prn and reassess. From history and absence of fever, does not appear to be cardiac in nature. Will get EKG to assess for pericarditis.    Oneal Grout, MD  [facility] [phone] (Monday-Friday 8 am - 5 pm) [phone] (afterhours)

## 2015-03-06 ENCOUNTER — Non-Acute Institutional Stay (SKILLED_NURSING_FACILITY): Payer: Medicare Other | Admitting: Internal Medicine

## 2015-03-06 ENCOUNTER — Other Ambulatory Visit: Payer: Self-pay | Admitting: *Deleted

## 2015-03-06 DIAGNOSIS — J209 Acute bronchitis, unspecified: Secondary | ICD-10-CM | POA: Diagnosis not present

## 2015-03-06 DIAGNOSIS — I739 Peripheral vascular disease, unspecified: Secondary | ICD-10-CM | POA: Diagnosis not present

## 2015-03-06 DIAGNOSIS — E039 Hypothyroidism, unspecified: Secondary | ICD-10-CM

## 2015-03-06 DIAGNOSIS — G8100 Flaccid hemiplegia affecting unspecified side: Secondary | ICD-10-CM | POA: Diagnosis not present

## 2015-03-06 MED ORDER — FENTANYL 25 MCG/HR TD PT72: MEDICATED_PATCH | TRANSDERMAL | Status: DC

## 2015-03-06 NOTE — Telephone Encounter (Signed)
[name] 

## 2015-03-09 NOTE — Progress Notes (Signed)
Patient ID: Erica French, female   DOB: 05-04-1930, 81 y.o.   MRN: [id number]      Facility: [facility]  Chief complaint- routine visit  Allergies reviewed  Code status- DNR  HPI 81 y/o female patient is seen today for routine visit. She complaints of dry cough for a week. She mentions this to have worsened over past few days. Her chest pain is better with naproxen. Denies any other concerns. She has pmh of HTN, temporal arteritis, CVA with hemiplegia, PVD, GERD, COPD, osteoporosis   Review of Systems   Constitutional: Negative for fever, chills, diaphoresis.   HENT: positive for congestion   Respiratory: Negative for sputum production, shortness of breath and wheezing.    Cardiovascular: Negative for chest pain, palpitations.   Gastrointestinal: Negative for heartburn, nausea, vomiting, abdominal pain.   Genitourinary: Negative for dysuria.   Musculoskeletal: Negative for falls.   Skin: Negative for itching and rash.   Neurological: Negative for dizziness, tingling, headaches.   Psychiatric/Behavioral: positive for depression.  Past medical history reviewed  Medication reviewed. See [facility]   Medication List       This list is accurate as of: 03/06/15 11:59 PM.  Always use your most recent med list.               calcium-vitamin D 500-200 MG-UNIT per tablet  Commonly known as:  OSCAL WITH D  Take 1 tablet by mouth 2 (two) times daily.     cholecalciferol 1000 UNITS tablet  Commonly known as:  VITAMIN D  Take 2,000 Units by mouth daily.     clopidogrel 75 MG tablet  Commonly known as:  PLAVIX  Take 75 mg by mouth daily.     Cranberry 200 MG Caps  Take 400 mg by mouth 2 (two) times daily.     desvenlafaxine 50 MG 24 hr tablet  Commonly known as:  PRISTIQ  Take 50 mg by mouth daily.     fentaNYL 25 MCG/HR patch  Commonly known as:  DURAGESIC - dosed mcg/hr  Apply one patch every 72 hours for pain. Remove old patch.  External Use only. Rotate sites. Check placement of patch every shift     Fluticasone-Salmeterol 250-50 MCG/DOSE Aepb  Commonly known as:  ADVAIR  Inhale 1 puff into the lungs 2 (two) times daily.     folic acid 0.5 MG tablet  Commonly known as:  FOLVITE  Take 1 mg by mouth daily.     gabapentin 100 MG capsule  Commonly known as:  NEURONTIN  Take 100 mg by mouth 3 (three) times daily.     levothyroxine 25 MCG tablet  Commonly known as:  SYNTHROID, LEVOTHROID  Take 25 mcg by mouth daily before breakfast.     magnesium oxide 400 MG tablet  Commonly known as:  MAG-OX  Take 400 mg by mouth daily.     mirtazapine 7.5 MG tablet  Commonly known as:  REMERON  Take 15 mg by mouth at bedtime.     pantoprazole 40 MG tablet  Commonly known as:  PROTONIX  Take 20 mg by mouth 2 (two) times daily.     polyethylene glycol powder powder  Commonly known as:  GLYCOLAX/MIRALAX  Take 17 g by mouth daily.     polyvinyl alcohol 1.4 % ophthalmic solution  Commonly known as:  LIQUIFILM TEARS  Place 1 drop into both eyes as needed for dry eyes.     predniSONE 1 MG  tablet  Commonly known as:  DELTASONE  Take 1 tablet (1 mg total) by mouth daily.     vitamin B-12 1000 MCG tablet  Commonly known as:  CYANOCOBALAMIN  Take 1,000 mcg by mouth daily.        Physical exam BP 124/64 mmHg  Pulse 88  Temp(Src) 97.7 F (36.5 C)  Resp 18  Wt 135 lb 3.2 oz (61.326 kg)  Wt Readings from Last 3 Encounters:  03/06/15 135 lb 3.2 oz (61.326 kg)  02/03/15 132 lb 9.6 oz (60.147 kg)  01/11/15 135 lb 8 oz (61.462 kg)    General- elderly female in no acute distress Head- atraumatic, normocephalic Eyes- PERRLA, EOMI Neck- no thyromegaly, no jugular vein distension Mouth- normal mucus membrane Cardiovascular- normal s1,s2, no murmurs, diminished pedal pulses, no edema Respiratory- bilateral clear to auscultation, no wheeze, + rhonchi, no crackles Abdomen- bowel sounds present, soft, non  tender Musculoskeletal: left hemiparesis, normal ROM on right side. Assist with transfers. Needs to be pushed in wheelchair Lymphadenopathy: She has no cervical adenopathy.  Neurological: She is alert and oriented to person, place, and time.   Skin: Skin is warm and dry.  gerisleeves in place Psychiatric: She has flat affect. Her behavior is normal.    Labs reviewed  01/10/14 glu 85, bun 16, cr 0.84, na 140, k 3.8, ca 9.3, ast 1174, alt 497, alp 385, t.bil 0.9, alb 3.5, wbc 9.5, hb 12.3, hct 38.3, plt 204,neutrophil 7.5 01/11/14 t.pro 5.7, alb 3, t.bil 0.9, alp 264, ast 414, alt 324 01/10/14 urine- cloudy but negative for elukocytes and nitrites; no growth in culture 05/12/14- iron 51, ferritin 29, b12 1500, wbc 6.2, hb 10.4, hct 34.9, mcv 95.1, plt 288, na 138, k 4.2, bun 23, cr 0.8, glu 74, ca 9.4 05/28/14 na 136, k 4.5, bun 22, cr 0.8, glu 85, ca 9.6 05/30/14 wbc 7.7, hb 10.7, hct 35.5, plt 256 06/28/14 tsh 2.69 08/22/14 wbc 7.4, hb 11.9, hct 35.2, plt 259, na 138, k 4.6, bun 28, cr 0.8, glu 88, ca 9.9 10/11/14 wbc 8.9, hb 11, plt 305, na 138, k 4.7, bun 18, cr 0.8, gluc 79 12/12/14 wbc 9.1, hb 10, hct 31.3, plt 318, na 138, k 4.4, bun 28, cr 1.31, glu 75 12/20/14 wbc 8.1, hb 9.8, mcv 88.6, plt 372   Assessment/plan  Acute bronchitis Has hx of copd, treat empirically with 5 days of azithromycin course. Will have her on mucinex and duoneb for symptomatic treatment and reassess. Continue advair. If not improved, get a repeat cxr  pvd Stable, continue plavix  Hypothyroidism continue levothyroxine 25 mcg daily  Flaccid hemiplegia Stable, continue plavix 75 mg daily snd her fentanyl patch with neurontin   Oneal Grout, MD  [facility] [phone] (Monday-Friday 8 am - 5 pm) [phone] (afterhours)

## 2015-03-21 LAB — CBC AND DIFFERENTIAL
HCT: 34 % — AB (ref 36–46)
Hemoglobin: 10.6 g/dL — AB (ref 12.0–16.0)
Platelets: 372 10*3/uL (ref 150–399)
WBC: 6.9 10*3/mL

## 2015-03-21 LAB — HEPATIC FUNCTION PANEL
ALK PHOS: 95 U/L (ref 25–125)
ALT: 13 U/L (ref 7–35)
AST: 29 U/L (ref 13–35)
Bilirubin, Total: 0.3 mg/dL

## 2015-03-21 LAB — BASIC METABOLIC PANEL
BUN: 17 mg/dL (ref 4–21)
CREATININE: 0.8 mg/dL (ref 0.5–1.1)
Glucose: 79 mg/dL
POTASSIUM: 4.7 mmol/L (ref 3.4–5.3)
Sodium: 139 mmol/L (ref 137–147)

## 2015-04-06 ENCOUNTER — Other Ambulatory Visit: Payer: Self-pay

## 2015-04-06 MED ORDER — FENTANYL 25 MCG/HR TD PT72: MEDICATED_PATCH | TRANSDERMAL | Status: DC

## 2015-04-06 NOTE — Telephone Encounter (Signed)
Rx faxed to [facility] @ [phone], phone number [phone]  

## 2015-04-13 ENCOUNTER — Encounter: Payer: Self-pay | Admitting: Internal Medicine

## 2015-04-13 ENCOUNTER — Non-Acute Institutional Stay (SKILLED_NURSING_FACILITY): Payer: Medicare Other | Admitting: Internal Medicine

## 2015-04-13 DIAGNOSIS — M150 Primary generalized (osteo)arthritis: Secondary | ICD-10-CM

## 2015-04-13 DIAGNOSIS — F1120 Opioid dependence, uncomplicated: Secondary | ICD-10-CM | POA: Diagnosis not present

## 2015-04-13 DIAGNOSIS — R110 Nausea: Secondary | ICD-10-CM

## 2015-04-13 DIAGNOSIS — M810 Age-related osteoporosis without current pathological fracture: Secondary | ICD-10-CM | POA: Diagnosis not present

## 2015-04-13 NOTE — Progress Notes (Signed)
Patient ID: Erica French, female   DOB: [birth year], 81 y.o.   MRN: [id number]       Facility: [facility]  Chief complaint- routine visit  Allergies reviewed  Code status- DNR  HPI 81 y/o female patient is seen today for routine visit. She complaints of nausea in the morning for past couple of days. Denies any vomiting. She says it gets better as day progresses. This has affected her morning appetite. No abdominal pain, diarrhea or constipation reported. Denies chest pain or dyspnea with the nausea. Denies any other concerns. She has pmh of HTN, temporal arteritis, CVA with hemiplegia, PVD, GERD, COPD, osteoporosis   Review of Systems   Constitutional: Negative for fever, chills, diaphoresis.   HENT: positive for congestion   Respiratory: Negative for sputum production, shortness of breath and wheezing.    Cardiovascular: Negative for chest pain, palpitations.   Gastrointestinal: Negative for heartburn   Genitourinary: Negative for dysuria.   Musculoskeletal: Negative for falls.   Skin: Negative for itching and rash.   Neurological: Negative for dizziness, tingling, headaches.    Past medical history reviewed  Medication reviewed. See [facility]   Medication List       This list is accurate as of: 04/13/15  4:04 PM.  Always use your most recent med list.               acetaminophen 325 MG tablet  Commonly known as:  TYLENOL  Take 650 mg by mouth every 4 (four) hours as needed.     calcium-vitamin D 500-200 MG-UNIT per tablet  Commonly known as:  OSCAL WITH D  Take 1 tablet by mouth 2 (two) times daily.     clopidogrel 75 MG tablet  Commonly known as:  PLAVIX  Take 75 mg by mouth daily.     Cranberry 200 MG Caps  Take 400 mg by mouth 2 (two) times daily.     desvenlafaxine 50 MG 24 hr tablet  Commonly known as:  PRISTIQ  Take 50 mg by mouth daily.     fentaNYL 25 MCG/HR patch  Commonly known as:  DURAGESIC - dosed mcg/hr    Apply one patch every 72 hours for pain. Remove old patch. External Use only. Rotate sites. Check placement of patch every shift     Fluticasone-Salmeterol 250-50 MCG/DOSE Aepb  Commonly known as:  ADVAIR  Inhale 1 puff into the lungs 2 (two) times daily.     folic acid 1 MG tablet  Commonly known as:  FOLVITE  Take 1 mg by mouth daily. For supplement     gabapentin 100 MG capsule  Commonly known as:  NEURONTIN  Take 100 mg by mouth 3 (three) times daily.     ipratropium-albuterol 0.5-2.5 (3) MG/3ML Soln  Commonly known as:  DUONEB  Take 3 mLs by nebulization every 4 (four) hours as needed. For SOB     levothyroxine 25 MCG tablet  Commonly known as:  SYNTHROID, LEVOTHROID  Take 25 mcg by mouth daily before breakfast.     magnesium oxide 400 MG tablet  Commonly known as:  MAG-OX  Take 400 mg by mouth daily.     mirtazapine 30 MG tablet  Commonly known as:  REMERON  Take 30 mg by mouth at bedtime.     ondansetron 4 MG tablet  Commonly known as:  ZOFRAN  Take 4 mg by mouth every 6 (six) hours as needed for nausea or vomiting.  polyethylene glycol powder powder  Commonly known as:  GLYCOLAX/MIRALAX  Take 17 g by mouth daily.     polyvinyl alcohol 1.4 % ophthalmic solution  Commonly known as:  LIQUIFILM TEARS  Place 1 drop into both eyes as needed for dry eyes.     predniSONE 1 MG tablet  Commonly known as:  DELTASONE  Take 1 tablet (1 mg total) by mouth daily.     PROTONIX 20 MG tablet  Generic drug:  pantoprazole  Take 20 mg by mouth daily.     vitamin B-12 1000 MCG tablet  Commonly known as:  CYANOCOBALAMIN  Take 1,000 mcg by mouth daily.     Vitamin D3 1000 UNITS Caps  Take by mouth daily. For Vit D Deficiency        Physical exam BP 169/78 mmHg  Pulse 90  Temp(Src) 97.3 F (36.3 C) (Oral)  Resp 19  Ht 5\' 6"  (1.676 m)  Wt 135 lb (61.236 kg)  BMI 21.80 kg/m2  SpO2 93%  Wt Readings from Last 3 Encounters:  04/13/15 135 lb (61.236 kg)   03/06/15 135 lb 3.2 oz (61.326 kg)  02/03/15 132 lb 9.6 oz (60.147 kg)    General- elderly female in no acute distress Head- atraumatic, normocephalic Eyes- PERRLA, EOMI Neck- no thyromegaly, no jugular vein distension Mouth- normal mucus membrane Cardiovascular- normal s1,s2, no murmurs, diminished pedal pulses, no edema Respiratory- bilateral clear to auscultation, no wheeze, + rhonchi, no crackles Abdomen- bowel sounds present, soft, non tender, no guarding or rigidity Musculoskeletal: left hemiparesis, normal ROM on right side. Assist with transfers. Needs to be pushed in wheelchair Lymphadenopathy: She has no cervical adenopathy.  Neurological: She is alert and oriented to person, place, and time.   Skin: Skin is warm and dry.  gerisleeves in place Psychiatric: She has flat affect. Her behavior is normal.    Labs reviewed 01/10/14 glu 85, bun 16, cr 0.84, na 140, k 3.8, ca 9.3, ast 1174, alt 497, alp 385, t.bil 0.9, alb 3.5, wbc 9.5, hb 12.3, hct 38.3, plt 204,neutrophil 7.5 01/11/14 t.pro 5.7, alb 3, t.bil 0.9, alp 264, ast 414, alt 324 01/10/14 urine- cloudy but negative for elukocytes and nitrites; no growth in culture 05/12/14- iron 51, ferritin 29, b12 1500, wbc 6.2, hb 10.4, hct 34.9, mcv 95.1, plt 288, na 138, k 4.2, bun 23, cr 0.8, glu 74, ca 9.4 05/28/14 na 136, k 4.5, bun 22, cr 0.8, glu 85, ca 9.6 05/30/14 wbc 7.7, hb 10.7, hct 35.5, plt 256 06/28/14 tsh 2.69 08/22/14 wbc 7.4, hb 11.9, hct 35.2, plt 259, na 138, k 4.6, bun 28, cr 0.8, glu 88, ca 9.9 10/11/14 wbc 8.9, hb 11, plt 305, na 138, k 4.7, bun 18, cr 0.8, gluc 79 12/12/14 wbc 9.1, hb 10, hct 31.3, plt 318, na 138, k 4.4, bun 28, cr 1.31, glu 75 12/20/14 wbc 8.1, hb 9.8, mcv 88.6, plt 372 03/21/15 wbc 6.9, hb 10.6, hct 33.6, plt 372, na 139, k 4.7, bun 17, cr 0.80, glu 79   Assessment/plan  Nausea Without vomiting. Has hx of reflux disease, this could be contributing some. No new meds on review. increase protonix to  20 mg bid and reassess. reglan prn for nausea  Generalized OA Stable, continue current regimen of tylenol prn and fentanyl patch  Age related osteoporosis Continue calcium and vitamin d supplement, fall precautions  Opioid dependence Uncomplicated, stable, continue fentanyl patch 25 mcg patch   Oneal Grout, MD  [facility] [phone] (Monday-Friday 8 am - 5 pm) [phone] (afterhours)

## 2015-05-01 ENCOUNTER — Other Ambulatory Visit: Payer: Self-pay | Admitting: *Deleted

## 2015-05-01 MED ORDER — FENTANYL 25 MCG/HR TD PT72: MEDICATED_PATCH | TRANSDERMAL | Status: DC

## 2015-05-01 NOTE — Telephone Encounter (Signed)
[facility] 

## 2015-05-17 ENCOUNTER — Encounter: Payer: Self-pay | Admitting: Internal Medicine

## 2015-05-17 ENCOUNTER — Non-Acute Institutional Stay (SKILLED_NURSING_FACILITY): Payer: Medicare Other | Admitting: Internal Medicine

## 2015-05-17 DIAGNOSIS — K219 Gastro-esophageal reflux disease without esophagitis: Secondary | ICD-10-CM | POA: Diagnosis not present

## 2015-05-17 DIAGNOSIS — M810 Age-related osteoporosis without current pathological fracture: Secondary | ICD-10-CM

## 2015-05-17 DIAGNOSIS — J449 Chronic obstructive pulmonary disease, unspecified: Secondary | ICD-10-CM | POA: Diagnosis not present

## 2015-05-17 DIAGNOSIS — M818 Other osteoporosis without current pathological fracture: Secondary | ICD-10-CM

## 2015-05-17 NOTE — Progress Notes (Signed)
Patient ID: Erica French, female   DOB: 03-Dec-1929, 81 y.o.   MRN: [id number]       Facility: [facility]  Chief complaint- routine visit  Allergies reviewed  Code status- DNR  HPI 81 y/o female patient is seen today for routine visit. She is comfortable in her bed and denies any concerns this morning. No new concern from staff. Her reflux has been under control with increased dosing of the PPI.   Review of Systems   Constitutional: Negative for fever, chills, diaphoresis.   HENT: positive for congestion   Respiratory: Negative for sputum production, shortness of breath and wheezing.    Cardiovascular: Negative for chest pain, palpitations.   Gastrointestinal: Negative for heartburn   Genitourinary: Negative for dysuria.   Musculoskeletal: Negative for falls.   Skin: Negative for itching and rash.   Neurological: Negative for dizziness, tingling, headaches.   Past medical history reviewed   Medication reviewed. See [facility]   Medication List       This list is accurate as of: 05/17/15 11:12 AM.  Always use your most recent med list.               acetaminophen 325 MG tablet  Commonly known as:  TYLENOL  Take 650 mg by mouth every 4 (four) hours as needed.     calcium-vitamin D 500-200 MG-UNIT per tablet  Commonly known as:  OSCAL WITH D  Take 1 tablet by mouth 2 (two) times daily.     clopidogrel 75 MG tablet  Commonly known as:  PLAVIX  Take 75 mg by mouth daily.     Cranberry 200 MG Caps  Take 400 mg by mouth 2 (two) times daily.     desvenlafaxine 50 MG 24 hr tablet  Commonly known as:  PRISTIQ  Take 50 mg by mouth daily.     fentaNYL 25 MCG/HR patch  Commonly known as:  DURAGESIC - dosed mcg/hr  Apply one patch every 72 hours for pain. Remove old patch. External Use only. Rotate sites. Check placement of patch every shift     Fluticasone-Salmeterol 250-50 MCG/DOSE Aepb  Commonly known as:  ADVAIR  Inhale 1  puff into the lungs 2 (two) times daily.     folic acid 1 MG tablet  Commonly known as:  FOLVITE  Take 1 mg by mouth daily. For supplement     gabapentin 100 MG capsule  Commonly known as:  NEURONTIN  Take 100 mg by mouth 3 (three) times daily.     ipratropium-albuterol 0.5-2.5 (3) MG/3ML Soln  Commonly known as:  DUONEB  Take 3 mLs by nebulization every 4 (four) hours as needed. For SOB     levothyroxine 25 MCG tablet  Commonly known as:  SYNTHROID, LEVOTHROID  Take 25 mcg by mouth daily before breakfast.     magnesium oxide 400 MG tablet  Commonly known as:  MAG-OX  Take 400 mg by mouth daily.     mirtazapine 30 MG tablet  Commonly known as:  REMERON  Take 30 mg by mouth at bedtime.     ondansetron 4 MG tablet  Commonly known as:  ZOFRAN  Take 4 mg by mouth every 6 (six) hours as needed for nausea or vomiting.     polyethylene glycol powder powder  Commonly known as:  GLYCOLAX/MIRALAX  Take 17 g by mouth daily.     polyvinyl alcohol 1.4 % ophthalmic solution  Commonly known as:  LIQUIFILM  TEARS  Place 1 drop into both eyes as needed for dry eyes.     predniSONE 1 MG tablet  Commonly known as:  DELTASONE  Take 1 tablet (1 mg total) by mouth daily.     PROTONIX 20 MG tablet  Generic drug:  pantoprazole  Take 20 mg by mouth 2 (two) times daily.     vitamin B-12 1000 MCG tablet  Commonly known as:  CYANOCOBALAMIN  Take 1,000 mcg by mouth daily.     Vitamin D3 2000 UNITS Tabs  Take by mouth. 1 by mouth daily for Vit D Deficiency        Physical exam BP 108/78 mmHg  Pulse 59  Temp(Src) 97.7 F (36.5 C) (Oral)  Resp 22  Ht 5\' 6"  (1.676 m)  Wt 134 lb 3.2 oz (60.873 kg)  BMI 21.67 kg/m2  SpO2 94%  Wt Readings from Last 3 Encounters:  05/17/15 134 lb 3.2 oz (60.873 kg)  04/13/15 135 lb (61.236 kg)  03/06/15 135 lb 3.2 oz (61.326 kg)    General- elderly female in no acute distress Head- atraumatic, normocephalic Eyes- PERRLA, EOMI Neck- no  thyromegaly, no jugular vein distension Mouth- normal mucus membrane Cardiovascular- normal s1,s2, no murmurs, diminished pedal pulses, no edema Respiratory- bilateral clear to auscultation, no wheeze, no rhonchi, no crackles Abdomen- bowel sounds present, soft, non tender, no guarding or rigidity Musculoskeletal: left hemiparesis, normal ROM on right side. Assist with transfers. Needs to be pushed in wheelchair Lymphadenopathy: She has no cervical adenopathy.  Neurological: She is alert and oriented to person, place, and time.   Skin: Skin is warm and dry.  gerisleeves in place Psychiatric: She has flat affect. Her behavior is normal.    Labs reviewed 05/28/14 na 136, k 4.5, bun 22, cr 0.8, glu 85, ca 9.6 05/30/14 wbc 7.7, hb 10.7, hct 35.5, plt 256 06/28/14 tsh 2.69 08/22/14 wbc 7.4, hb 11.9, hct 35.2, plt 259, na 138, k 4.6, bun 28, cr 0.8, glu 88, ca 9.9 10/11/14 wbc 8.9, hb 11, plt 305, na 138, k 4.7, bun 18, cr 0.8, gluc 79 12/12/14 wbc 9.1, hb 10, hct 31.3, plt 318, na 138, k 4.4, bun 28, cr 1.31, glu 75 12/20/14 wbc 8.1, hb 9.8, mcv 88.6, plt 372 03/21/15 wbc 6.9, hb 10.6, hct 33.6, plt 372, na 139, k 4.7, bun 17, cr 0.80, glu 79   Assessment/plan  gerd Symptoms controlled. Continue protonix 20 mg bid for now and monitor  Age related osteoporosis Continue calcium and vitamin d supplement, fall precautions  Copd Breathing stable on room air. Continue advair and duoneb and monitor   Oneal Grout, MD  [facility] [phone] (Monday-Friday 8 am - 5 pm) [phone] (afterhours)

## 2015-06-05 ENCOUNTER — Other Ambulatory Visit: Payer: Self-pay | Admitting: *Deleted

## 2015-06-05 MED ORDER — FENTANYL 25 MCG/HR TD PT72: MEDICATED_PATCH | TRANSDERMAL | Status: DC

## 2015-06-05 NOTE — Telephone Encounter (Signed)
[name] 

## 2015-06-12 ENCOUNTER — Non-Acute Institutional Stay (SKILLED_NURSING_FACILITY): Payer: Medicare Other | Admitting: Internal Medicine

## 2015-06-12 DIAGNOSIS — G8191 Hemiplegia, unspecified affecting right dominant side: Secondary | ICD-10-CM

## 2015-06-12 DIAGNOSIS — G8190 Hemiplegia, unspecified affecting unspecified side: Secondary | ICD-10-CM

## 2015-06-12 DIAGNOSIS — M316 Other giant cell arteritis: Secondary | ICD-10-CM | POA: Diagnosis not present

## 2015-06-12 DIAGNOSIS — M545 Low back pain, unspecified: Secondary | ICD-10-CM

## 2015-06-12 DIAGNOSIS — G8929 Other chronic pain: Secondary | ICD-10-CM | POA: Diagnosis not present

## 2015-06-12 NOTE — Progress Notes (Signed)
Patient ID: Erica French, female   DOB: April 02, 1930, 81 y.o.   MRN: [id number]      [facility]     [facility] Service[facility] SN[facility] (31)    PCP: Oneal Grout, MD   Code Status: DNR    Allergies  Allergen Reactions  . Prozac [Fluoxetine Hcl] Anaphylaxis, Hives, Itching, Swelling and Rash  . Cymbalta [Duloxetine Hcl]   . Sulfa Antibiotics   . Codeine Itching and Rash    Chief Complaint  Patient presents with  . Medical Management of Chronic Issues     HPI:  81 y.o. patient is seen for routine visit. She has been moved to a new room and likes her room and roommate. she has been at her baseline and denies any concerns this am.    Review of Systems   Constitutional: Negative for fever, chills, diaphoresis.   HENT: positive for congestion   Respiratory: Negative for sputum production, shortness of breath and wheezing.    Cardiovascular: Negative for chest pain, palpitations.   Gastrointestinal: Negative for heartburn   Genitourinary: Negative for dysuria.   Musculoskeletal: Negative for falls.   Skin: Negative for itching and rash.   Neurological: Negative for dizziness, tingling, headaches.   Past Medical History  Diagnosis Date  . COPD (chronic obstructive pulmonary disease)   . Anxiety   . Hypertension   . Depression   . Thyroid nodule   . Headache(784.0)     temple artiritis  . Stroke     left sided weakness- transfers with walke rand assiast  . Pneumonia 05/2012  . H/O arteritis     Temporal  . Gastritis      Past Surgical History  Procedure Laterality Date  . Hernia repair    . Cholecystectomy    . Thyroid surgery    . Esophagogastroduodenoscopy  06/08/2012    Procedure: ESOPHAGOGASTRODUODENOSCOPY (EGD);  Surgeon: Florencia Reasons, MD;  Location[facility] END[facility]COPY;  Service: Endoscopy;  Laterality: N/A;  . Appendectomy    . Oophorectomy    . Kyphoplasty  2012  . Kyphoplasty N/A 11/24/2012    Procedure: KYPHOPLASTY;  Surgeon: Barnett Abu, MD;  Location: [facility] NEURO ORS;  Service: Neurosurgery;  Laterality: N/A;  Thoracic Five Thoracic Six and Thoracic Seven Acrylic Balloon Kyphoplasty     Medications:   Medication List       This list is accurate as of: 06/12/15  5:18 PM.  Always use your most recent med list.               acetaminophen 325 MG tablet  Commonly known as:  TYLENOL  Take 650 mg by mouth every 4 (four) hours as needed.     calcium-vitamin D 500-200 MG-UNIT per tablet  Commonly known as:  OSCAL WITH D  Take 1 tablet by mouth 2 (two) times daily.     clopidogrel 75 MG tablet  Commonly known as:  PLAVIX  Take 75 mg by mouth daily.     Cranberry 200 MG Caps  Take 400 mg by mouth 2 (two) times daily.     desvenlafaxine 50 MG 24 hr tablet  Commonly known as:  PRISTIQ  Take 50 mg by mouth daily.     fentaNYL 25 MCG/HR patch  Commonly known as:  DURAGESIC - dosed mcg/hr  Apply one patch every 72 hours for pain. Remove old patch. External Use only. Rotate sites. Check placement of patch every shift     Fluticasone-Salmeterol 250-50  MCG/DOSE Aepb  Commonly known as:  ADVAIR  Inhale 1 puff into the lungs 2 (two) times daily.     folic acid 1 MG tablet  Commonly known as:  FOLVITE  Take 1 mg by mouth daily. For supplement     gabapentin 100 MG capsule  Commonly known as:  NEURONTIN  Take 100 mg by mouth 3 (three) times daily.     ipratropium-albuterol 0.5-2.5 (3) MG/3ML Soln  Commonly known as:  DUONEB  Take 3 mLs by nebulization every 4 (four) hours as needed. For SOB     levothyroxine 25 MCG tablet  Commonly known as:  SYNTHROID, LEVOTHROID  Take 25 mcg by mouth daily before breakfast.     magnesium oxide 400 MG tablet  Commonly known as:  MAG-OX  Take 400 mg by mouth daily.     mirtazapine 30 MG tablet  Commonly known as:  REMERON  Take 30 mg by mouth at bedtime.     ondansetron 4 MG tablet  Commonly known as:  ZOFRAN  Take 4 mg by mouth every 6 (six) hours as needed for  nausea or vomiting.     polyethylene glycol powder powder  Commonly known as:  GLYCOLAX/MIRALAX  Take 17 g by mouth daily.     polyvinyl alcohol 1.4 % ophthalmic solution  Commonly known as:  LIQUIFILM TEARS  Place 1 drop into both eyes as needed for dry eyes.     predniSONE 1 MG tablet  Commonly known as:  DELTASONE  Take 1 tablet (1 mg total) by mouth daily.     PROTONIX 20 MG tablet  Generic drug:  pantoprazole  Take 20 mg by mouth 2 (two) times daily.     vitamin B-12 1000 MCG tablet  Commonly known as:  CYANOCOBALAMIN  Take 1,000 mcg by mouth daily.     Vitamin D3 2000 UNITS Tabs  Take by mouth. 1 by mouth daily for Vit D Deficiency         Physical Exam: Filed Vitals:   06/12/15 1713  BP: 110/74  Pulse: 78  Temp: 97 F (36.1 C)  Resp: 16  Weight: 140 lb (63.504 kg)   Body mass index is 22.61 kg/(m^2).  General- elderly thin built female in no acute distress Head- atraumatic, normocephalic Eyes- PERRLA, EOMI Neck- no thyromegaly, no jugular vein distension Mouth- normal mucus membrane Cardiovascular- normal s1,s2, no murmurs, diminished pedal pulses, no edema Respiratory- bilateral clear to auscultation, no wheeze, no rhonchi, no crackles Abdomen- bowel sounds present, soft, non tender, no guarding or rigidity Musculoskeletal: left hemiplegia, normal ROM on right side. Assist with transfers. Needs to be pushed in wheelchair Lymphadenopathy: She has no cervical adenopathy.  Neurological: She is alert and oriented to person, place, and time.   Skin: Skin is warm and dry.  gerisleeves in place Psychiatric: She has flat affect. Her behavior is normal.    Labs reviewed 05/28/14 na 136, k 4.5, bun 22, cr 0.8, glu 85, ca 9.6 05/30/14 wbc 7.7, hb 10.7, hct 35.5, plt 256 06/28/14 tsh 2.69 08/22/14 wbc 7.4, hb 11.9, hct 35.2, plt 259, na 138, k 4.6, bun 28, cr 0.8, glu 88, ca 9.9 10/11/14 wbc 8.9, hb 11, plt 305, na 138, k 4.7, bun 18, cr 0.8, gluc 79 12/12/14 wbc  9.1, hb 10, hct 31.3, plt 318, na 138, k 4.4, bun 28, cr 1.31, glu 75 12/20/14 wbc 8.1, hb 9.8, mcv 88.6, plt 372 03/21/15 wbc 6.9, hb 10.6, hct 33.6, plt  372, na 139, k 4.7, bun 17, cr 0.80, glu 79   Assessment/plan  Chronic low back pain Stable, continue fentanyl patch 25 mcg daily with prn tylenol and monitor  Temporal arteritis Stable, continue prednisone 1 mg daily  CVA with left hemiplegia Continue plavix 75 mg daily   MAHIMA PANDEY, MD  [facility] [address] [place], [place] [address] Monday-Friday 8 am - 5 pm: [phone] Afterhours: [phone]  Fax: [phone]

## 2015-07-05 ENCOUNTER — Other Ambulatory Visit: Payer: Self-pay | Admitting: *Deleted

## 2015-07-05 MED ORDER — FENTANYL 25 MCG/HR TD PT72: MEDICATED_PATCH | TRANSDERMAL | Status: DC

## 2015-07-05 NOTE — Telephone Encounter (Signed)
[name] 

## 2015-07-06 ENCOUNTER — Other Ambulatory Visit: Payer: Self-pay | Admitting: *Deleted

## 2015-07-06 MED ORDER — FENTANYL 25 MCG/HR TD PT72: MEDICATED_PATCH | TRANSDERMAL | Status: DC

## 2015-07-06 NOTE — Telephone Encounter (Signed)
[name] 

## 2015-07-12 ENCOUNTER — Encounter: Payer: Self-pay | Admitting: Nurse Practitioner

## 2015-07-12 NOTE — Progress Notes (Signed)
This encounter was created in error - please disregard.

## 2015-09-15 LAB — BASIC METABOLIC PANEL
BUN: 15 mg/dL (ref 4–21)
Creatinine: 1.3 mg/dL — AB (ref 0.5–1.1)
GLUCOSE: 131 mg/dL
Potassium: 4.5 mmol/L (ref 3.4–5.3)
SODIUM: 138 mmol/L (ref 137–147)

## 2015-09-15 LAB — CBC AND DIFFERENTIAL
HEMATOCRIT: 35 % — AB (ref 36–46)
HEMOGLOBIN: 11.4 g/dL — AB (ref 12.0–16.0)
Platelets: 278 10*3/uL (ref 150–399)
WBC: 18.4 10^3/mL

## 2015-10-10 ENCOUNTER — Other Ambulatory Visit: Payer: Self-pay | Admitting: *Deleted

## 2015-10-10 MED ORDER — FENTANYL 25 MCG/HR TD PT72: MEDICATED_PATCH | TRANSDERMAL | Status: DC

## 2015-10-10 NOTE — Telephone Encounter (Signed)
[name] 

## 2015-10-19 ENCOUNTER — Encounter: Payer: Self-pay | Admitting: Internal Medicine

## 2015-10-19 ENCOUNTER — Non-Acute Institutional Stay (SKILLED_NURSING_FACILITY): Payer: Medicare Other | Admitting: Internal Medicine

## 2015-10-19 DIAGNOSIS — M810 Age-related osteoporosis without current pathological fracture: Secondary | ICD-10-CM

## 2015-10-19 DIAGNOSIS — J449 Chronic obstructive pulmonary disease, unspecified: Secondary | ICD-10-CM

## 2015-10-19 DIAGNOSIS — D638 Anemia in other chronic diseases classified elsewhere: Secondary | ICD-10-CM | POA: Diagnosis not present

## 2015-10-19 NOTE — Progress Notes (Signed)
Patient ID: Erica French, female   DOB: April 14, 1930, 82 y.o.   MRN: [id number]      [facility] Room Number: 1104- B  Place of Service: SNF (31)    PCP: Erica Grout, MD   Code Status: DNR    Allergies  Allergen Reactions  . Prozac [Fluoxetine Hcl] Anaphylaxis, Hives, Itching, Swelling and Rash  . Cymbalta [Duloxetine Hcl]   . Sulfa Antibiotics   . Codeine Itching and Rash    Chief Complaint  Patient presents with  . Medical Management of Chronic Issues    Routine Visit     HPI:  82 y.o. patient is seen for routine visit. She has been at her baseline. Denies any concerns. No pressure sores. No falls reported by staff.   Review of Systems   Constitutional: Negative for fever, chills, diaphoresis.   HENT: negative for headache, blurred vision Respiratory: Negative for sputum production, shortness of breath and wheezing.    Cardiovascular: Negative for chest pain, palpitations.   Gastrointestinal: Negative for heartburn, nausea, vomiting, abdominal pain   Genitourinary: Negative for dysuria.   Musculoskeletal: Negative for falls.   Skin: Negative for itching and rash.   Neurological: Negative for dizziness   Past Medical History  Diagnosis Date  . COPD (chronic obstructive pulmonary disease) (HCC)   . Anxiety   . Hypertension   . Depression   . Thyroid nodule   . Headache(784.0)     temple artiritis  . Stroke [facility])     left sided weakness- transfers with walke rand assiast  . Pneumonia 05/2012  . H/O arteritis     Temporal  . Gastritis      Medications:   Medication List       This list is accurate as of: 10/19/15  3:54 PM.  Always use your most recent med list.               acetaminophen 325 MG tablet  Commonly known as:  TYLENOL  Take 650 mg by mouth every 4 (four) hours as needed.     calcium-vitamin D 500-200 MG-UNIT tablet  Commonly known as:  OSCAL WITH D  Take 1 tablet by mouth 2 (two)  times daily.     clopidogrel 75 MG tablet  Commonly known as:  PLAVIX  Take 75 mg by mouth daily.     Cranberry 200 MG Caps  Take 400 mg by mouth 2 (two) times daily.     desvenlafaxine 50 MG 24 hr tablet  Commonly known as:  PRISTIQ  Take 50 mg by mouth daily.     fentaNYL 25 MCG/HR patch  Commonly known as:  DURAGESIC - dosed mcg/hr  Apply one patch every 72 hours for pain. Remove old patch. External Use only. Rotate sites. Check placement of patch every shift     ferrous sulfate 325 (65 FE) MG tablet  Take 325 mg by mouth daily with breakfast.     Fluticasone-Salmeterol 250-50 MCG/DOSE Aepb  Commonly known as:  ADVAIR  Inhale 1 puff into the lungs every 12 (twelve) hours. Rinse mouth after use     folic acid 1 MG tablet  Commonly known as:  FOLVITE  Take 1 mg by mouth daily. For supplement     gabapentin 100 MG capsule  Commonly known as:  NEURONTIN  Take 100 mg by mouth 3 (three) times daily.     levothyroxine 25 MCG tablet  Commonly known as:  SYNTHROID,  LEVOTHROID  Take 25 mcg by mouth daily before breakfast.     magnesium oxide 400 MG tablet  Commonly known as:  MAG-OX  Take 400 mg by mouth daily.     mirtazapine 30 MG tablet  Commonly known as:  REMERON  Take 30 mg by mouth at bedtime.     ondansetron 4 MG tablet  Commonly known as:  ZOFRAN  Take 4 mg by mouth every 6 (six) hours as needed for nausea or vomiting.     polyethylene glycol powder powder  Commonly known as:  GLYCOLAX/MIRALAX  Take 17 g by mouth daily.     polyvinyl alcohol 1.4 % ophthalmic solution  Commonly known as:  LIQUIFILM TEARS  Place 1 drop into both eyes as needed for dry eyes.     predniSONE 1 MG tablet  Commonly known as:  DELTASONE  Take 1 tablet (1 mg total) by mouth daily.     PROTONIX 20 MG tablet  Generic drug:  pantoprazole  Take 20 mg by mouth 2 (two) times daily.     senna 8.6 MG Tabs tablet  Commonly known as:  SENOKOT  Take 1 tablet by mouth at bedtime.      vitamin B-12 1000 MCG tablet  Commonly known as:  CYANOCOBALAMIN  Take 1,000 mcg by mouth daily.     Vitamin D3 2000 units Tabs  Take by mouth. 1 by mouth daily for Vit D Deficiency         Physical Exam: Filed Vitals:   10/19/15 1539  BP: 113/70  Temp: 97.5 F (36.4 C)  TempSrc: Oral  Resp: 19    General- elderly thin built female in no acute distress Head- atraumatic, normocephalic Eyes- PERRLA, EOMI Neck- no thyromegaly, no jugular vein distension Mouth- normal mucus membrane Cardiovascular- normal s1,s2, no murmurs, diminished pedal pulses Respiratory- bilateral clear to auscultation, no wheeze, no rhonchi, no crackles Abdomen- bowel sounds present, soft, non tender, no guarding or rigidity Musculoskeletal: left hemiplegia, normal ROM on right side. Assist with transfers. Needs to be wheeled in wheelchair. Brace to left arm. Has leg wraps to both legs Lymphadenopathy: She has no cervical adenopathy.  Neurological: She is alert and oriented to person, place, and time.   Skin: Skin is warm and dry.  gerisleeves in place Psychiatric: normal mood and affect   Labs reviewed CBC Latest Ref Rng 09/15/2015 03/21/2015 01/10/2014  WBC - 18.4 6.9 9.5  Hemoglobin 12.0 - 16.0 g/dL 11.4(A) 10.6(A) 12.3  Hematocrit 36 - 46 % 35(A) 34(A) 38.3  Platelets 150 - 399 K/L 278 372 204   CMP Latest Ref Rng 09/15/2015 03/21/2015 01/10/2014  Glucose 65-99 mg/dL - - 85  BUN 4 - 21 mg/dL Creatinine 0.5 - 1.1 mg/dL 1.3(A) 0.8 0.84  Sodium 137 - 147 mmol/L 138 139 140  Potassium 3.4 - 5.3 mmol/L 4.5 4.7 3.8  Chloride 98-107 mmol/L - - 102  CO2 21-32 mmol/L - - 32  Calcium 8.5-10.1 mg/dL - - 9.3  Total Protein 6.4-8.2 g/dL - - 7.2  Total Bilirubin 0.2-1.0 mg/dL - - 0.9  Alkaline Phos 25 - 125 U/L - 95 385(H)  AST 13 - 35 U/L - 29 1174(H)  ALT 7 - 35 U/L - 13 497(H)   Lab Results  Component Value Date   TSH 1.02 02/06/2015    Assessment/plan  Anemia of chronic  disease Reviewed Hb. Continue ferrous sulfate 325 mg daily and b12 1000 mcg daily. Continue folic acid.  Monitor h&h periodically  Senile osteoporosis With hx of fractures. Continue oscal and vitamin d supplement  COPD Stable, continue advair for now. Was treated for pneumonia in December. Breathing currently stable    Erica Amory, MD  [facility] [address] [place], [place] [address] Monday-Friday 8 am - 5 pm: [phone] Afterhours: [phone]  Fax: [phone]

## 2015-11-06 ENCOUNTER — Other Ambulatory Visit: Payer: Self-pay | Admitting: *Deleted

## 2015-11-06 MED ORDER — FENTANYL 25 MCG/HR TD PT72: MEDICATED_PATCH | TRANSDERMAL | Status: DC

## 2015-11-06 NOTE — Telephone Encounter (Signed)
[name] 

## 2015-11-14 LAB — BASIC METABOLIC PANEL
BUN: 16 mg/dL (ref 4–21)
CREATININE: 0.9 mg/dL (ref 0.5–1.1)
Glucose: 90 mg/dL
POTASSIUM: 4.5 mmol/L (ref 3.4–5.3)
SODIUM: 142 mmol/L (ref 137–147)

## 2015-11-14 LAB — HEPATIC FUNCTION PANEL
ALT: 13 U/L (ref 7–35)
AST: 19 U/L (ref 13–35)
Alkaline Phosphatase: 87 U/L (ref 25–125)
BILIRUBIN, TOTAL: 0.4 mg/dL

## 2015-11-14 LAB — TSH: TSH: 2.58 u[IU]/mL (ref 0.41–5.90)

## 2015-11-14 LAB — CBC AND DIFFERENTIAL
HCT: 39 % (ref 36–46)
Hemoglobin: 11.9 g/dL — AB (ref 12.0–16.0)
Platelets: 284 10*3/uL (ref 150–399)
WBC: 7 10^3/mL

## 2015-11-14 LAB — LIPID PANEL
CHOLESTEROL: 206 mg/dL — AB (ref 0–200)
HDL: 61 mg/dL (ref 35–70)
LDL Cholesterol: 15 mg/dL
TRIGLYCERIDES: 77 mg/dL (ref 40–160)

## 2015-11-21 LAB — CBC AND DIFFERENTIAL
HEMATOCRIT: 40 % (ref 36–46)
Hemoglobin: 12.5 g/dL (ref 12.0–16.0)
PLATELETS: 257 10*3/uL (ref 150–399)
WBC: 7.4 10^3/mL

## 2015-11-23 ENCOUNTER — Non-Acute Institutional Stay (SKILLED_NURSING_FACILITY): Payer: Medicare Other | Admitting: Internal Medicine

## 2015-11-23 ENCOUNTER — Encounter: Payer: Self-pay | Admitting: Internal Medicine

## 2015-11-23 DIAGNOSIS — F329 Major depressive disorder, single episode, unspecified: Secondary | ICD-10-CM | POA: Diagnosis not present

## 2015-11-23 DIAGNOSIS — M316 Other giant cell arteritis: Secondary | ICD-10-CM

## 2015-11-23 DIAGNOSIS — G8190 Hemiplegia, unspecified affecting unspecified side: Secondary | ICD-10-CM

## 2015-11-23 DIAGNOSIS — K2100 Gastro-esophageal reflux disease with esophagitis, without bleeding: Secondary | ICD-10-CM

## 2015-11-23 DIAGNOSIS — E890 Postprocedural hypothyroidism: Secondary | ICD-10-CM | POA: Diagnosis not present

## 2015-11-23 NOTE — Progress Notes (Signed)
Patient ID: Erica French, female   DOB: [birth year], 82 y.o.   MRN: [id number]      [facility]  N[facility]ing Home Room Number: 1104-B  Place of Service: SNF (31)    PCP: Oneal Grout, MD   Code Status: DNR    Allergies  Allergen Reactions  . Prozac [Fluoxetine Hcl] Anaphylaxis, Hives, Itching, Swelling and Rash  . Cymbalta [Duloxetine Hcl]   . Sulfa Antibiotics   . Codeine Itching and Rash    Chief Complaint  Patient presents with  . Medical Management of Chronic Issues    Routine Visit     HPI:  82 y.o. patient is seen for routine visit. She has been at her baseline. Denies any concerns.   Review of Systems   Constitutional: Negative for fever, chills, diaphoresis.   HENT: negative for headache, blurred vision Respiratory: Negative for shortness of breath and wheezing.    Cardiovascular: Negative for chest pain, palpitations.   Gastrointestinal: Negative for heartburn, nausea, vomiting Genitourinary: Negative for dysuria.   Musculoskeletal: Negative for fall   Neurological: Negative for dizziness   Past Medical History  Diagnosis Date  . COPD (chronic obstructive pulmonary disease) (HCC)   . Anxiety   . Hypertension   . Depression   . Thyroid nodule   . Headache(784.0)     temple artiritis  . Stroke [facility])     left sided weakness- transfers with walke rand assiast  . Pneumonia 05/2012  . H/O arteritis     Temporal  . Gastritis      Medications:   Medication List       This list is accurate as of: 11/23/15  3:37 PM.  Always use your most recent med list.               acetaminophen 325 MG tablet  Commonly known as:  TYLENOL  Take 650 mg by mouth every 4 (four) hours as needed.     calcium-vitamin D 500-200 MG-UNIT tablet  Commonly known as:  OSCAL WITH D  Take 1 tablet by mouth 2 (two) times daily.     clopidogrel 75 MG tablet  Commonly known as:  PLAVIX  Take 75 mg by mouth daily.     Cranberry 200 MG Caps    Take 400 mg by mouth 2 (two) times daily.     fentaNYL 25 MCG/HR patch  Commonly known as:  DURAGESIC - dosed mcg/hr  Apply one patch every 72 hours for pain. Remove old patch. External Use only. Rotate sites. Check placement of patch every shift     ferrous sulfate 325 (65 FE) MG tablet  Take 325 mg by mouth daily with breakfast.     Fluticasone-Salmeterol 250-50 MCG/DOSE Aepb  Commonly known as:  ADVAIR  Inhale 1 puff into the lungs every 12 (twelve) hours. Rinse mouth after use     folic acid 1 MG tablet  Commonly known as:  FOLVITE  Take 1 mg by mouth daily. For supplement     gabapentin 100 MG capsule  Commonly known as:  NEURONTIN  Take 100 mg by mouth 3 (three) times daily.     levothyroxine 25 MCG tablet  Commonly known as:  SYNTHROID, LEVOTHROID  Take 25 mcg by mouth daily before breakfast.     magnesium oxide 400 MG tablet  Commonly known as:  MAG-OX  Take 400 mg by mouth daily.     mirtazapine 30 MG tablet  Commonly known as:  REMERON  Take 30 mg by mouth at bedtime.     ondansetron 4 MG tablet  Commonly known as:  ZOFRAN  Take 4 mg by mouth every 6 (six) hours as needed for nausea or vomiting.     polyethylene glycol powder powder  Commonly known as:  GLYCOLAX/MIRALAX  Take 17 g by mouth daily.     polyvinyl alcohol 1.4 % ophthalmic solution  Commonly known as:  LIQUIFILM TEARS  Place 1 drop into both eyes as needed for dry eyes.     predniSONE 1 MG tablet  Commonly known as:  DELTASONE  Take 1 tablet (1 mg total) by mouth daily.     PRISTIQ 25 MG Tb24  Generic drug:  Desvenlafaxine Succinate ER  Take 25 mg by mouth daily.     PROTONIX 20 MG tablet  Generic drug:  pantoprazole  Take 20 mg by mouth 2 (two) times daily.     senna 8.6 MG Tabs tablet  Commonly known as:  SENOKOT  Take 1 tablet by mouth at bedtime.     vitamin B-12 1000 MCG tablet  Commonly known as:  CYANOCOBALAMIN  Take 1,000 mcg by mouth daily.     Vitamin D3 2000 units  Tabs  Take by mouth. 1 by mouth daily for Vit D Deficiency         Physical Exam: Filed Vitals:   11/23/15 1524  BP: 101/59  Pulse: 93  Temp: 97 F (36.1 C)  TempSrc: Oral  Resp: 18  Height:  (1.676 m)  Weight: 140 lb 12.8 oz (63.866 kg)    General- elderly thin built female in no acute distress Head- atraumatic, normocephalic Eyes- PERRLA, EOMI Neck- no thyromegaly Mouth- normal mucus membrane Cardiovascular- normal s1,s2, no murmur Respiratory- bilateral clear to auscultation, no wheeze, no rhonchi, no crackles Abdomen- bowel sounds present, soft, non tender, no guarding or rigidity Musculoskeletal: left hemiplegia, normal ROM on right side. Assist with transfers.  Lymphadenopathy: She has no cervical adenopathy.  Neurological: She is alert and oriented  Skin: Skin is warm and dry.  gerisleeves in place Psychiatric: normal mood and affect   Labs reviewed CBC Latest Ref Rng 11/14/2015 09/15/2015 03/21/2015  WBC - 7.0 18.4 6.9  Hemoglobin 12.0 - 16.0 g/dL 11.9(A) 11.4(A) 10.6(A)  Hematocrit 36 - 46 % 39 35(A) 34(A)  Platelets 150 - 399 K/L 284 278 372   CMP Latest Ref Rng 11/14/2015 09/15/2015 03/21/2015  Glucose 65-99 mg/dL - - -  BUN 4 - 21 mg/dL Creatinine 0.5 - 1.1 mg/dL 0.9 4.5(W) 0.8  Sodium 137 - 147 mmol/L 142 138 139  Potassium 3.4 - 5.3 mmol/L 4.5 4.5 4.7  Chloride 98-107 mmol/L - - -  CO2 21-32 mmol/L - - -  Calcium 8.5-10.1 mg/dL - - -  Total Protein 0.9-8.1 g/dL - - -  Total Bilirubin 0.2-1.0 mg/dL - - -  Alkaline Phos 25 - 125 U/L 87 - 95  AST 13 - 35 U/L 19 - 29  ALT 7 - 35 U/L 13 - 13   Lab Results  Component Value Date   TSH 2.58 11/14/2015    Assessment/plan  Hypothyroidism Reviewed tsh continue levothyroxine 25 mcg daily  gerd Stable and controlled symptom, decrease protonix to 20 mg daily and monitor.   Temporal arteritis Stable, continue chronic prednisone 1 mg daily  Depression Stable mood. Continue pristiq 25  mg daily (reduced in feb) and remeron and monitor.   CVA With residual  hemiplegia. Continue plavix for now   [facility] Center[facility]HIMA Ryon Layton, MD  [facility] [address] [place], [place] [address] Monday-Friday 8 am - 5 pm: [phone] Afterhours: [phone]  Fax: [phone]

## 2015-12-08 ENCOUNTER — Other Ambulatory Visit: Payer: Self-pay | Admitting: *Deleted

## 2015-12-08 MED ORDER — FENTANYL 25 MCG/HR TD PT72: MEDICATED_PATCH | TRANSDERMAL | Status: DC

## 2015-12-08 NOTE — Telephone Encounter (Signed)
[name] 

## 2015-12-25 ENCOUNTER — Encounter: Payer: Self-pay | Admitting: Internal Medicine

## 2015-12-25 ENCOUNTER — Non-Acute Institutional Stay (SKILLED_NURSING_FACILITY): Payer: Medicare Other | Admitting: Internal Medicine

## 2015-12-25 DIAGNOSIS — M818 Other osteoporosis without current pathological fracture: Secondary | ICD-10-CM

## 2015-12-25 DIAGNOSIS — F329 Major depressive disorder, single episode, unspecified: Secondary | ICD-10-CM

## 2015-12-25 DIAGNOSIS — E038 Other specified hypothyroidism: Secondary | ICD-10-CM | POA: Diagnosis not present

## 2015-12-25 DIAGNOSIS — R002 Palpitations: Secondary | ICD-10-CM | POA: Diagnosis not present

## 2015-12-25 DIAGNOSIS — R450 Nervousness: Secondary | ICD-10-CM | POA: Diagnosis not present

## 2015-12-25 DIAGNOSIS — M810 Age-related osteoporosis without current pathological fracture: Secondary | ICD-10-CM

## 2015-12-25 DIAGNOSIS — F32A Depression, unspecified: Secondary | ICD-10-CM

## 2015-12-25 NOTE — Progress Notes (Signed)
Patient ID: Erica French, female   DOB: 06/19/1930, 82 y.o.   MRN: [id number]      [facility]  [facility] Room Number: 1104-B  Place of Service: SNF (31)    PCP: Oneal Grout, MD   Code Status: DNR    Allergies  Allergen Reactions  . Prozac [Fluoxetine Hcl] Anaphylaxis, Hives, Itching, Swelling and Rash  . Cymbalta [Duloxetine Hcl]   . Sulfa Antibiotics   . Codeine Itching and Rash    Chief Complaint  Patient presents with  . Medical Management of Chronic Issues    Routine Visit     HPI:  82 y.o. patient is seen for routine visit. She complaints of feeling restless and having occassional racing of her heart. She feels low in terms of her energy. She mentions being anxious and feeling low. Denies any other concerns.   Review of Systems   Constitutional: Negative for fever, chills, diaphoresis.   HENT: negative for headache, blurred vision Respiratory: Negative for shortness of breath and wheezing.    Cardiovascular: Negative for chest pain.   Gastrointestinal: Negative for heartburn, nausea, vomiting Genitourinary: Negative for dysuria.   Musculoskeletal: Negative for fall   Neurological: Negative for dizziness   Past Medical History  Diagnosis Date  . COPD (chronic obstructive pulmonary disease) (HCC)   . Anxiety   . Hypertension   . Depression   . Thyroid nodule   . Headache(784.0)     temple artiritis  . Stroke [facility])     left sided weakness- transfers with walke rand assiast  . Pneumonia 05/2012  . H/O arteritis     Temporal  . Gastritis      Medications:   Medication List       This list is accurate as of: 12/25/15  4:38 PM.  Always use your most recent med list.               acetaminophen 325 MG tablet  Commonly known as:  TYLENOL  Take 650 mg by mouth every 4 (four) hours as needed.     calcium-vitamin D 500-200 MG-UNIT tablet  Commonly known as:  OSCAL WITH D  Take 1 tablet by mouth 2 (two) times  daily.     clopidogrel 75 MG tablet  Commonly known as:  PLAVIX  Take 75 mg by mouth daily.     Cranberry 200 MG Caps  Take 400 mg by mouth 2 (two) times daily.     fentaNYL 25 MCG/HR patch  Commonly known as:  DURAGESIC - dosed mcg/hr  Apply one patch every 72 hours for pain. Remove old patch. External Use only. Rotate sites. Check placement of patch every shift     ferrous sulfate 325 (65 FE) MG tablet  Take 325 mg by mouth daily with breakfast.     Fluticasone-Salmeterol 250-50 MCG/DOSE Aepb  Commonly known as:  ADVAIR  Inhale 1 puff into the lungs every 12 (twelve) hours. Rinse mouth after use     folic acid 1 MG tablet  Commonly known as:  FOLVITE  Take 1 mg by mouth daily. For supplement     gabapentin 100 MG capsule  Commonly known as:  NEURONTIN  Take 100 mg by mouth 3 (three) times daily.     levothyroxine 25 MCG tablet  Commonly known as:  SYNTHROID, LEVOTHROID  Take 25 mcg by mouth daily before breakfast.     magnesium oxide 400 MG tablet  Commonly known as:  MAG-OX  Take 400 mg by mouth daily.     mirtazapine 30 MG tablet  Commonly known as:  REMERON  Take 30 mg by mouth at bedtime.     ondansetron 4 MG tablet  Commonly known as:  ZOFRAN  Take 4 mg by mouth every 6 (six) hours as needed for nausea or vomiting.     polyethylene glycol powder powder  Commonly known as:  GLYCOLAX/MIRALAX  Take 17 g by mouth daily.     polyvinyl alcohol 1.4 % ophthalmic solution  Commonly known as:  LIQUIFILM TEARS  Place 1 drop into both eyes as needed for dry eyes.     predniSONE 1 MG tablet  Commonly known as:  DELTASONE  Take 1 tablet (1 mg total) by mouth daily.     PRISTIQ 25 MG Tb24  Generic drug:  Desvenlafaxine Succinate ER  Take 25 mg by mouth daily.     PROTONIX 20 MG tablet  Generic drug:  pantoprazole  Take 20 mg by mouth 2 (two) times daily.     senna 8.6 MG Tabs tablet  Commonly known as:  SENOKOT  Take 1 tablet by mouth at bedtime.      vitamin B-12 1000 MCG tablet  Commonly known as:  CYANOCOBALAMIN  Take 1,000 mcg by mouth daily.     Vitamin D3 2000 units Tabs  Take by mouth. 1 by mouth daily for Vit D Deficiency         Physical Exam: Filed Vitals:   12/25/15 1630  BP: 119/63  Pulse: 100  Temp: 97.4 F (36.3 C)  TempSrc: Oral  Resp: 17  Height:  (1.676 m)  Weight: 143 lb 6.4 oz (65.046 kg)  Body mass index is 23.16 kg/(m^2).   General- elderly thin built female in no acute distress Head- atraumatic, normocephalic Eyes- PERRLA, EOMI Neck- no thyromegaly Mouth- normal mucus membrane Cardiovascular- normal s1,s2, no murmur Respiratory- bilateral clear to auscultation, no wheeze, no rhonchi, no crackles Abdomen- bowel sounds present, soft, non tender, no guarding or rigidity Musculoskeletal: left hemiplegia, normal ROM on right side. Assist with transfers.  Lymphadenopathy: She has no cervical adenopathy.  Neurological: She is alert and oriented  Skin: Skin is warm and dry.  gerisleeves in place Psychiatric: appears anxious   Labs reviewed CBC Latest Ref Rng 11/21/2015 11/14/2015 09/15/2015  WBC - 7.4 7.0 18.4  Hemoglobin 12.0 - 16.0 g/dL 40.9 11.9(A) 11.4(A)  Hematocrit 36 - 46 % 40 39 35(A)  Platelets 150 - 399 K/L 257 284 278   CMP Latest Ref Rng 11/14/2015 09/15/2015 03/21/2015  Glucose 65-99 mg/dL - - -  BUN 4 - 21 mg/dL Creatinine 0.5 - 1.1 mg/dL 0.9 8.1(X) 0.8  Sodium 137 - 147 mmol/L 142 138 139  Potassium 3.4 - 5.3 mmol/L 4.5 4.5 4.7  Chloride 98-107 mmol/L - - -  CO2 21-32 mmol/L - - -  Calcium 8.5-10.1 mg/dL - - -  Total Protein 9.1-4.7 g/dL - - -  Total Bilirubin 0.2-1.0 mg/dL - - -  Alkaline Phos 25 - 125 U/L 87 - 95  AST 13 - 35 U/L 19 - 29  ALT 7 - 35 U/L 13 - 13   Lab Results  Component Value Date   TSH 2.58 11/14/2015    Assessment/plan  Nervousness Patient has been experiencing this for 3 weeks and has been anxious and depressed. She also has been  experiencing palpitation. Check thyroid function, cbc with diff. Had reduction in pristiq  recently. Will increase her pristiq to 50 mg daily and also have her on lorazepam 0.5 mg daily x 3 days, then daily as needed for 1 week and monitor  Palpitation Normal heart sound and regular heart rate on clinical exam, likely from anxiety component but check thyroid panel with her hx of hypothyroidism  Hypothyroidism continue levothyroxine 25 mcg daily. With her recent palpitations, check tsh with free t4  Age related osteoporosis Continue vitamin d and calcium supplement  Chronic depression Did not tolerate reduction of pristiq, increase it to 50 mg daily and monitor   Oneal GroutMAHIMA Vandora Jaskulski, MD  [facility] [address] [place], [place] [address] Monday-Friday 8 am - 5 pm: [phone] Afterhours: [phone]  Fax: [phone]

## 2015-12-26 ENCOUNTER — Other Ambulatory Visit: Payer: Self-pay | Admitting: *Deleted

## 2015-12-26 DIAGNOSIS — F411 Generalized anxiety disorder: Secondary | ICD-10-CM

## 2015-12-26 LAB — BASIC METABOLIC PANEL
BUN: 16 mg/dL (ref 4–21)
CREATININE: 0.8 mg/dL (ref 0.5–1.1)
Glucose: 86 mg/dL
Potassium: 4.9 mmol/L (ref 3.4–5.3)
Sodium: 141 mmol/L (ref 137–147)

## 2015-12-26 LAB — CBC AND DIFFERENTIAL
HCT: 42 % (ref 36–46)
Hemoglobin: 13 g/dL (ref 12.0–16.0)
PLATELETS: 327 10*3/uL (ref 150–399)
WBC: 10.2 10^3/mL

## 2015-12-26 LAB — HEPATIC FUNCTION PANEL
ALK PHOS: 119 U/L (ref 25–125)
ALT: 11 U/L (ref 7–35)
AST: 15 U/L (ref 13–35)
Bilirubin, Total: 0.4 mg/dL

## 2015-12-26 LAB — TSH: TSH: 2.35 u[IU]/mL (ref 0.41–5.90)

## 2015-12-26 MED ORDER — LORAZEPAM 0.5 MG PO TABS: ORAL_TABLET | ORAL | Status: DC

## 2016-01-15 ENCOUNTER — Encounter: Payer: Self-pay | Admitting: Internal Medicine

## 2016-01-15 ENCOUNTER — Non-Acute Institutional Stay (SKILLED_NURSING_FACILITY): Payer: Medicare Other | Admitting: Internal Medicine

## 2016-01-15 DIAGNOSIS — D509 Iron deficiency anemia, unspecified: Secondary | ICD-10-CM

## 2016-01-15 DIAGNOSIS — M316 Other giant cell arteritis: Secondary | ICD-10-CM

## 2016-01-15 DIAGNOSIS — J449 Chronic obstructive pulmonary disease, unspecified: Secondary | ICD-10-CM | POA: Diagnosis not present

## 2016-01-15 NOTE — Progress Notes (Signed)
Patient ID: Erica French, female   DOB: [birth year], 82 y.o.   MRN: [id number]      [facility]  [facility] Room Number: 1104-B  Place of Service: SNF (31)    PCP: Oneal Grout, MD   Code Status: DNR    Allergies  Allergen Reactions  . Prozac [Fluoxetine Hcl] Anaphylaxis, Hives, Itching, Swelling and Rash  . Cymbalta [Duloxetine Hcl]   . Sulfa Antibiotics   . Codeine Itching and Rash    Chief Complaint  Patient presents with  . Medical Management of Chronic Issues    Routine Visit     HPI:  82 y.o. patient is seen for routine visit. She denies any concern today. Her mood is stable. She denies pain. No new concern from nursing staff   Review of Systems   Constitutional: Negative for fever, chills, diaphoresis.   HENT: negative for headache, blurred vision Respiratory: Negative for shortness of breath and wheezing.    Cardiovascular: Negative for chest pain.   Gastrointestinal: Negative for heartburn, nausea, vomiting Genitourinary: Negative for dysuria.   Musculoskeletal: Negative for fall   Neurological: Negative for dizziness   Past Medical History  Diagnosis Date  . COPD (chronic obstructive pulmonary disease) (HCC)   . Anxiety   . Hypertension   . Depression   . Thyroid nodule   . Headache(784.0)     temple artiritis  . Stroke [facility])     left sided weakness- transfers with walke rand assiast  . Pneumonia 05/2012  . H/O arteritis     Temporal  . Gastritis      Medications:   Medication List       This list is accurate as of: 01/15/16  2:40 PM.  Always use your most recent med list.               acetaminophen 325 MG tablet  Commonly known as:  TYLENOL  Take 650 mg by mouth every 4 (four) hours as needed.     calcium-vitamin D 500-200 MG-UNIT tablet  Commonly known as:  OSCAL WITH D  Take 1 tablet by mouth 2 (two) times daily.     clopidogrel 75 MG tablet  Commonly known as:  PLAVIX  Take 75 mg by mouth  daily.     Cranberry 200 MG Caps  Take 400 mg by mouth 2 (two) times daily.     desvenlafaxine 50 MG 24 hr tablet  Commonly known as:  PRISTIQ  Take 50 mg by mouth daily.     fentaNYL 25 MCG/HR patch  Commonly known as:  DURAGESIC - dosed mcg/hr  Apply one patch every 72 hours for pain. Remove old patch. External Use only. Rotate sites. Check placement of patch every shift     ferrous sulfate 325 (65 FE) MG tablet  Take 325 mg by mouth daily with breakfast.     Fluticasone-Salmeterol 250-50 MCG/DOSE Aepb  Commonly known as:  ADVAIR  Inhale 1 puff into the lungs every 12 (twelve) hours. Rinse mouth after use     folic acid 1 MG tablet  Commonly known as:  FOLVITE  Take 1 mg by mouth daily. For supplement     gabapentin 100 MG capsule  Commonly known as:  NEURONTIN  Take 100 mg by mouth 3 (three) times daily.     levothyroxine 25 MCG tablet  Commonly known as:  SYNTHROID, LEVOTHROID  Take 25 mcg by mouth daily before breakfast.     magnesium  oxide 400 MG tablet  Commonly known as:  MAG-OX  Take 400 mg by mouth daily.     mirtazapine 30 MG tablet  Commonly known as:  REMERON  Take 30 mg by mouth at bedtime.     ondansetron 4 MG tablet  Commonly known as:  ZOFRAN  Take 4 mg by mouth every 6 (six) hours as needed for nausea or vomiting.     polyethylene glycol powder powder  Commonly known as:  GLYCOLAX/MIRALAX  Take 17 g by mouth daily.     polyvinyl alcohol 1.4 % ophthalmic solution  Commonly known as:  LIQUIFILM TEARS  Place 1 drop into both eyes 2 (two) times daily.     predniSONE 1 MG tablet  Commonly known as:  DELTASONE  Take 1 tablet (1 mg total) by mouth daily.     PROTONIX 20 MG tablet  Generic drug:  pantoprazole  Take 20 mg by mouth 2 (two) times daily.     senna 8.6 MG Tabs tablet  Commonly known as:  SENOKOT  Take 1 tablet by mouth at bedtime.     vitamin B-12 1000 MCG tablet  Commonly known as:  CYANOCOBALAMIN  Take 1,000 mcg by mouth daily.      Vitamin D3 2000 units Tabs  Take by mouth. 1 by mouth daily for Vit D Deficiency         Physical Exam: Filed Vitals:   01/15/16 1432  BP: 168/67  Pulse: 93  Temp: 97.2 F (36.2 C)  TempSrc: Oral  Resp: 17  Height: 5\' 6"  (1.676 m)  Weight: 143 lb 4.8 oz (65 kg)  SpO2: 92%  Body mass index is 23.14 kg/(m^2).   General- elderly thin built female in no acute distress Head- atraumatic, normocephalic Eyes- PERRLA, EOMI Neck- no thyromegaly Mouth- normal mucus membrane Cardiovascular- normal s1,s2, no murmur Respiratory- bilateral clear to auscultation, no wheeze, no rhonchi, no crackles Abdomen- bowel sounds present, soft, non tender, no guarding or rigidity Musculoskeletal: left hemiplegia, normal ROM on right side. Assist with transfers. Left hand contracted Lymphadenopathy: She has no cervical adenopathy.  Neurological: She is alert and oriented  Skin: Skin is warm and dry.  gerisleeves in place Psychiatric: normal mood and affect  Labs reviewed CBC Latest Ref Rng 11/21/2015 11/14/2015 09/15/2015  WBC - 7.4 7.0 18.4  Hemoglobin 12.0 - 16.0 g/dL 77.8 11.9(A) 11.4(A)  Hematocrit 36 - 46 % 40 39 35(A)  Platelets 150 - 399 K/L 257 284 278   CMP Latest Ref Rng 11/14/2015 09/15/2015 03/21/2015  Glucose 65-99 mg/dL - - -  BUN 4 - 21 mg/dL 16 15 17   Creatinine 0.5 - 1.1 mg/dL 0.9 2.4(M) 0.8  Sodium 137 - 147 mmol/L 142 138 139  Potassium 3.4 - 5.3 mmol/L 4.5 4.5 4.7  Chloride 98-107 mmol/L - - -  CO2 21-32 mmol/L - - -  Calcium 8.5-10.1 mg/dL - - -  Total Protein 3.5-3.6 g/dL - - -  Total Bilirubin 0.2-1.0 mg/dL - - -  Alkaline Phos 25 - 125 U/L 87 - 95  AST 13 - 35 U/L 19 - 29  ALT 7 - 35 U/L 13 - 13   Lab Results  Component Value Date   TSH 2.58 11/14/2015    Assessment/plan  Temporal arteritis Stable, continue chronic prednisone 1 mg daily  COPD Stable, no recent exacerbation. continue advair  Iron def anemia Continue ferrous sulfate and monitor cbc  periodically   Oneal Grout, MD  [facility]  [address] [place], [place] [address] Monday-Friday 8 am - 5 pm: [phone] Afterhours: [phone]  Fax: [phone]

## 2016-01-26 ENCOUNTER — Other Ambulatory Visit: Payer: Self-pay | Admitting: *Deleted

## 2016-01-26 MED ORDER — LORAZEPAM 0.5 MG PO TABS: ORAL_TABLET | ORAL | Status: DC

## 2016-01-26 NOTE — Telephone Encounter (Signed)
[name] 

## 2016-02-06 ENCOUNTER — Other Ambulatory Visit: Payer: Self-pay

## 2016-02-06 MED ORDER — FENTANYL 25 MCG/HR TD PT72: MEDICATED_PATCH | TRANSDERMAL | Status: DC

## 2016-02-06 NOTE — Telephone Encounter (Signed)
Rx faxed to [facility] @ [phone], phone number [phone]  

## 2016-02-22 ENCOUNTER — Encounter: Payer: Self-pay | Admitting: Internal Medicine

## 2016-02-22 ENCOUNTER — Non-Acute Institutional Stay (SKILLED_NURSING_FACILITY): Payer: Medicare Other | Admitting: Internal Medicine

## 2016-02-22 DIAGNOSIS — K2100 Gastro-esophageal reflux disease with esophagitis, without bleeding: Secondary | ICD-10-CM

## 2016-02-22 DIAGNOSIS — F339 Major depressive disorder, recurrent, unspecified: Secondary | ICD-10-CM

## 2016-02-22 DIAGNOSIS — E440 Moderate protein-calorie malnutrition: Secondary | ICD-10-CM

## 2016-02-22 NOTE — Progress Notes (Signed)
Patient ID: Erica French, female   DOB: [birth year], 82 y.o.   MRN: [id number]      [facility]  [facility] Room Number: 1104-B  Place of Service: SNF (31)    PCP: Oneal Grout, MD   Code Status: DNR    Allergies  Allergen Reactions  . Prozac [Fluoxetine Hcl] Anaphylaxis, Hives, Itching, Swelling and Rash  . Cymbalta [Duloxetine Hcl]   . Sulfa Antibiotics   . Codeine Itching and Rash    Chief Complaint  Patient presents with  . Medical Management of Chronic Issues    Routine Visit    Advanced Directives 05/17/2015  Does patient have an advance directive? Yes  Type of Advance Directive Out of facility DNR (pink MOST or yellow form)  Copy of advanced directive(s) in chart? Yes     HPI:  82 y.o. patient is seen for routine visit. She has been at her baseline. No new concern from staff, She denies any concern today.   Review of Systems   Constitutional: Negative for fever, chills, diaphoresis.   HENT: negative for headache, blurred vision Respiratory: Negative for shortness of breath and wheezing.    Cardiovascular: Negative for chest pain.   Gastrointestinal: Negative for heartburn, nausea, vomiting Genitourinary: Negative for dysuria.   Musculoskeletal: Negative for fall   Neurological: Negative for dizziness   Past Medical History  Diagnosis Date  . COPD (chronic obstructive pulmonary disease) (HCC)   . Anxiety   . Hypertension   . Depression   . Thyroid nodule   . Headache(784.0)     temple artiritis  . Stroke [facility])     left sided weakness- transfers with walke rand assiast  . Pneumonia 05/2012  . H/O arteritis     Temporal  . Gastritis      Medications:   Medication List       This list is accurate as of: 02/22/16  2:10 PM.  Always use your most recent med list.               acetaminophen 325 MG tablet  Commonly known as:  TYLENOL  Take 650 mg by mouth every 4 (four) hours as needed.     calcium-vitamin  D 500-200 MG-UNIT tablet  Commonly known as:  OSCAL WITH D  Take 1 tablet by mouth 2 (two) times daily.     clopidogrel 75 MG tablet  Commonly known as:  PLAVIX  Take 75 mg by mouth daily.     Cranberry 200 MG Caps  Take 400 mg by mouth 2 (two) times daily.     desvenlafaxine 50 MG 24 hr tablet  Commonly known as:  PRISTIQ  Take 50 mg by mouth daily.     fentaNYL 25 MCG/HR patch  Commonly known as:  DURAGESIC - dosed mcg/hr  Apply one patch every 72 hours for pain. Remove old patch. External Use only. Rotate sites. Check placement of patch every shift     ferrous sulfate 325 (65 FE) MG tablet  Take 325 mg by mouth daily with breakfast.     Fluticasone-Salmeterol 250-50 MCG/DOSE Aepb  Commonly known as:  ADVAIR  Inhale 1 puff into the lungs every 12 (twelve) hours. Rinse mouth after use     folic acid 1 MG tablet  Commonly known as:  FOLVITE  Take 1 mg by mouth daily. For supplement     gabapentin 100 MG capsule  Commonly known as:  NEURONTIN  Take 100 mg by  mouth 3 (three) times daily.     levothyroxine 25 MCG tablet  Commonly known as:  SYNTHROID, LEVOTHROID  Take 25 mcg by mouth daily before breakfast.     LORazepam 0.5 MG tablet  Commonly known as:  ATIVAN  Take 1/2 tablet by mouth twice daily as needed for anxiety/insomnia     magnesium oxide 400 MG tablet  Commonly known as:  MAG-OX  Take 400 mg by mouth daily.     mirtazapine 30 MG tablet  Commonly known as:  REMERON  Take 30 mg by mouth at bedtime.     ondansetron 4 MG tablet  Commonly known as:  ZOFRAN  Take 4 mg by mouth every 6 (six) hours as needed for nausea or vomiting.     polyethylene glycol powder powder  Commonly known as:  GLYCOLAX/MIRALAX  Take 17 g by mouth daily.     polyvinyl alcohol 1.4 % ophthalmic solution  Commonly known as:  LIQUIFILM TEARS  Place 1 drop into both eyes 2 (two) times daily.     predniSONE 1 MG tablet  Commonly known as:  DELTASONE  Take 1 tablet (1 mg total) by  mouth daily.     PROTONIX 20 MG tablet  Generic drug:  pantoprazole  Take 20 mg by mouth 2 (two) times daily.     senna 8.6 MG Tabs tablet  Commonly known as:  SENOKOT  Take 1 tablet by mouth at bedtime.     vitamin B-12 1000 MCG tablet  Commonly known as:  CYANOCOBALAMIN  Take 1,000 mcg by mouth daily.     Vitamin D3 2000 units Tabs  Take by mouth. 1 by mouth daily for Vit D Deficiency         Physical Exam: Filed Vitals:   02/22/16 1357  BP: 111/68  Pulse: 91  Temp: 98.1 F (36.7 C)  TempSrc: Oral  Resp: 18  Height: 5\' 6"  (1.676 m)  Weight: 140 lb 12.8 oz (63.866 kg)  SpO2: 92%  Body mass index is 22.74 kg/(m^2).  Wt Readings from Last 3 Encounters:  02/22/16 140 lb 12.8 oz (63.866 kg)  01/15/16 143 lb 4.8 oz (65 kg)  12/25/15 143 lb 6.4 oz (65.046 kg)    General- 82 y.o. thin built female in no acute distress Head- atraumatic, normocephalic Eyes- PERRLA, EOMI Neck- no thyromegaly Mouth- normal mucus membrane Cardiovascular- normal s1,s2, no murmur Respiratory- bilateral clear to auscultation, no wheeze, no rhonchi, no crackles Abdomen- bowel sounds present, soft, non tender, no guarding or rigidity Musculoskeletal: left hemiplegia, normal ROM on right side. Assist with transfers. Left hand contracted. Bilateral foot drop Lymphadenopathy: She has no cervical adenopathy.  Neurological: She is alert and oriented  Skin: Skin is warm and dry.  gerisleeves in place Psychiatric: normal mood and affect   Labs reviewed CBC Latest Ref Rng 12/26/2015 11/21/2015 11/14/2015  WBC - 10.2 7.4 7.0  Hemoglobin 12.0 - 16.0 g/dL 16.1 09.6 11.9(A)  Hematocrit 36 - 46 % 42 40 39  Platelets 150 - 399 K/L 327 257 284   CMP Latest Ref Rng 12/26/2015 11/14/2015 09/15/2015  Glucose 65-99 mg/dL - - -  BUN 4 - 21 mg/dL 16 16 15   Creatinine 0.5 - 1.1 mg/dL 0.8 0.9 0.4(V)  Sodium 137 - 147 mmol/L 141 142 138  Potassium 3.4 - 5.3 mmol/L 4.9 4.5 4.5  Chloride 98-107 mmol/L - - -    CO2 21-32 mmol/L - - -  Calcium 8.5-10.1 mg/dL - - -  Total  Protein 6.4-8.2 g/dL - - -  Total Bilirubin 0.2-1.0 mg/dL - - -  Alkaline Phos 25 - 125 U/L 119 87 -  AST 13 - 35 U/L 15 19 -  ALT 7 - 35 U/L 11 13 -   Lab Results  Component Value Date   TSH 2.35 12/26/2015    Assessment/plan  Protein calorie malnutrition Continue remeron for now. Monitor po intake and weight  Major depression stable, seen by psych services, she is OOB daily, continue pristiq and remeron  gerd with esophagitis. Continue protonix 20 mg daily    Oneal GroutMAHIMA Temika Sutphin, MD  [facility] [address] [place], [place] [address] Monday-Friday 8 am - 5 pm: [phone] Afterhours: [phone]  Fax: [phone]

## 2016-03-08 ENCOUNTER — Other Ambulatory Visit: Payer: Self-pay | Admitting: *Deleted

## 2016-03-08 MED ORDER — FENTANYL 25 MCG/HR TD PT72: MEDICATED_PATCH | TRANSDERMAL | Status: DC

## 2016-03-08 NOTE — Telephone Encounter (Signed)
[name] 

## 2016-03-27 ENCOUNTER — Non-Acute Institutional Stay (SKILLED_NURSING_FACILITY): Payer: Medicare Other | Admitting: Internal Medicine

## 2016-03-27 ENCOUNTER — Encounter: Payer: Self-pay | Admitting: Internal Medicine

## 2016-03-27 DIAGNOSIS — N183 Chronic kidney disease, stage 3 unspecified: Secondary | ICD-10-CM

## 2016-03-27 DIAGNOSIS — I739 Peripheral vascular disease, unspecified: Secondary | ICD-10-CM | POA: Diagnosis not present

## 2016-03-27 DIAGNOSIS — E890 Postprocedural hypothyroidism: Secondary | ICD-10-CM | POA: Diagnosis not present

## 2016-03-27 NOTE — Progress Notes (Signed)
Patient ID: IMAGINE NEST, female   DOB: 04/20/1930, 82 y.o.   MRN: [id number]      Facility   [facility] Room Number: 1104-B  Place of Service: SNF (31)    PCP: Oneal Grout, MD   Code Status: DNR    Allergies  Allergen Reactions  . Prozac [Fluoxetine Hcl] Anaphylaxis, Hives, Itching, Swelling and Rash  . Cymbalta [Duloxetine Hcl]   . Sulfa Antibiotics   . Codeine Itching and Rash    Chief Complaint  Patient presents with  . Medical Management of Chronic Issues    Routine Visit    Advanced Directives 03/27/2016  Does patient have an advance directive? Yes  Type of Advance Directive Out of facility DNR (pink MOST or yellow form)  Does patient want to make changes to advanced directive? No - Patient declined  Copy of advanced directive(s) in chart? Yes     HPI:  82 y.o. patient is seen for routine visit. She has been at her baseline. No new concern from staff or patient.   Review of Systems   Constitutional: Negative for fever.   HENT: negative for headache, blurred vision Respiratory: Negative for shortness of breath and wheezing.    Cardiovascular: Negative for chest pain.   Gastrointestinal: Negative for heartburn, nausea, vomiting, abdominal pain Genitourinary: Negative for dysuria.   Musculoskeletal: Negative for fall   Neurological: Negative for dizziness   Past Medical History  Diagnosis Date  . COPD (chronic obstructive pulmonary disease) (HCC)   . Anxiety   . Hypertension   . Depression   . Thyroid nodule   . Headache(784.0)     temple artiritis  . Stroke [facility])     left sided weakness- transfers with walke rand assiast  . Pneumonia 05/2012  . H/O arteritis     Temporal  . Gastritis      Medications:   Medication List       This list is accurate as of: 03/27/16  3:15 PM.  Always use your most recent med list.               acetaminophen 325 MG tablet  Commonly known as:  TYLENOL  Take 650 mg by mouth every 4 (four) hours as  needed.     calcium-vitamin D 500-200 MG-UNIT tablet  Commonly known as:  OSCAL WITH D  Take 1 tablet by mouth 2 (two) times daily.     clopidogrel 75 MG tablet  Commonly known as:  PLAVIX  Take 75 mg by mouth daily.     Cranberry 200 MG Caps  Take 400 mg by mouth 2 (two) times daily.     desvenlafaxine 50 MG 24 hr tablet  Commonly known as:  PRISTIQ  Take 50 mg by mouth daily.     fentaNYL 25 MCG/HR patch  Commonly known as:  DURAGESIC - dosed mcg/hr  Apply one patch every 72 hours for pain. Remove old patch. External Use only. Rotate sites. Check placement of patch every shift     ferrous sulfate 325 (65 FE) MG tablet  Take 325 mg by mouth daily with breakfast.     Fluticasone-Salmeterol 250-50 MCG/DOSE Aepb  Commonly known as:  ADVAIR  Inhale 1 puff into the lungs every 12 (twelve) hours. Rinse mouth after use     folic acid 1 MG tablet  Commonly known as:  FOLVITE  Take 1 mg by mouth daily. For supplement     gabapentin 100 MG capsule  Commonly known  as:  NEURONTIN  Take 100 mg by mouth 3 (three) times daily.     levothyroxine 25 MCG tablet  Commonly known as:  SYNTHROID, LEVOTHROID  Take 25 mcg by mouth daily before breakfast.     LORazepam 0.5 MG tablet  Commonly known as:  ATIVAN  Take 1/2 tablet by mouth twice daily as needed for anxiety/insomnia     magnesium oxide 400 MG tablet  Commonly known as:  MAG-OX  Take 400 mg by mouth daily.     mirtazapine 30 MG tablet  Commonly known as:  REMERON  Take 30 mg by mouth at bedtime.     ondansetron 4 MG tablet  Commonly known as:  ZOFRAN  Take 4 mg by mouth every 6 (six) hours as needed for nausea or vomiting.     polyethylene glycol powder powder  Commonly known as:  GLYCOLAX/MIRALAX  Take 17 g by mouth daily.     polyvinyl alcohol 1.4 % ophthalmic solution  Commonly known as:  LIQUIFILM TEARS  Place 1 drop into both eyes 2 (two) times daily.     predniSONE 1 MG tablet  Commonly known as:  DELTASONE    Take 1 tablet (1 mg total) by mouth daily.     PROTONIX 20 MG tablet  Generic drug:  pantoprazole  Take 20 mg by mouth 2 (two) times daily.     senna 8.6 MG Tabs tablet  Commonly known as:  SENOKOT  Take 1 tablet by mouth at bedtime.     vitamin B-12 1000 MCG tablet  Commonly known as:  CYANOCOBALAMIN  Take 1,000 mcg by mouth daily.     Vitamin D3 2000 units Tabs  Take by mouth. 1 by mouth daily for Vit D Deficiency         Physical Exam: Filed Vitals:   03/27/16 1509  BP: 127/59  Pulse: 89  Temp: 98.9 F (37.2 C)  TempSrc: Oral  Resp: 20  Height:  (1.676 m)  Weight: 140 lb 12.8 oz (63.866 kg)  SpO2: 93%  Body mass index is 22.74 kg/(m^2).  Wt Readings from Last 3 Encounters:  03/27/16 140 lb 12.8 oz (63.866 kg)  02/22/16 140 lb 12.8 oz (63.866 kg)  01/15/16 143 lb 4.8 oz (65 kg)    General- elderly thin built female in no acute distress Head- atraumatic, normocephalic Eyes- PERRLA, EOMI Neck- no thyromegaly Mouth- normal mucus membrane Cardiovascular- normal s1,s2, no murmur Respiratory- bilateral clear to auscultation, no wheeze, no rhonchi, no crackles Abdomen- bowel sounds present, soft, non tender, no guarding or rigidity Musculoskeletal: left hemiplegia, normal ROM on right side. Assist with transfers. Left hand contracted. Bilateral foot drop Lymphadenopathy: She has no cervical adenopathy.  Neurological: She is alert and oriented  Skin: Skin is warm and dry.  gerisleeves in place Psychiatric: normal mood and affect   Labs reviewed CBC Latest Ref Rng 12/26/2015 11/21/2015 11/14/2015  WBC - 10.2 7.4 7.0  Hemoglobin 12.0 - 16.0 g/dL 40.9 81.1 11.9(A)  Hematocrit 36 - 46 % 42 40 39  Platelets 150 - 399 K/L 327 257 284   CMP Latest Ref Rng 12/26/2015 11/14/2015 09/15/2015  Glucose 65-99 mg/dL - - -  BUN 4 - 21 mg/dL Creatinine 0.5 - 1.1 mg/dL 0.8 0.9 9.1(Y)  Sodium 137 - 147 mmol/L 141 142 138  Potassium 3.4 - 5.3 mmol/L 4.9 4.5 4.5   Chloride 98-107 mmol/L - - -  CO2 21-32 mmol/L - - -  Calcium 8.5-10.1 mg/dL - - -  Total Protein 2.1-3.06.4-8.2 g/dL - - -  Total Bilirubin 0.2-1.0 mg/dL - - -  Alkaline Phos 25 - 125 U/L 119 87 -  AST 13 - 35 U/L 15 19 -  ALT 7 - 35 U/L 11 13 -   Lab Results  Component Value Date   TSH 2.35 12/26/2015    Assessment/plan  Hypothyroidism continue levothyroxine 25 mcg daily. Reviewed tsh  PVD Continue plavix 75 mg daily. Continue skin care  ckd 3 Monitor bmp   Oneal GroutMAHIMA Yoland Scherr, MD  [facility] [address] [place], [place] [address] Monday-Friday 8 am - 5 pm: [phone] Afterhours: [phone]  Fax: [phone]

## 2016-04-24 ENCOUNTER — Encounter: Payer: Self-pay | Admitting: Internal Medicine

## 2016-04-24 ENCOUNTER — Non-Acute Institutional Stay (SKILLED_NURSING_FACILITY): Payer: Medicare Other | Admitting: Internal Medicine

## 2016-04-24 DIAGNOSIS — I69354 Hemiplegia and hemiparesis following cerebral infarction affecting left non-dominant side: Secondary | ICD-10-CM

## 2016-04-24 DIAGNOSIS — K2100 Gastro-esophageal reflux disease with esophagitis, without bleeding: Secondary | ICD-10-CM

## 2016-04-24 DIAGNOSIS — M159 Polyosteoarthritis, unspecified: Secondary | ICD-10-CM

## 2016-04-24 DIAGNOSIS — F1120 Opioid dependence, uncomplicated: Secondary | ICD-10-CM | POA: Diagnosis not present

## 2016-04-24 NOTE — Progress Notes (Signed)
This encounter was created in error - please disregard.

## 2016-04-24 NOTE — Progress Notes (Signed)
LOCATION: Facility: [facility]    PCP: Oneal Grout, MD   Code Status: DNR  Goals of care: Advanced Directive information Advanced Directives 03/27/2016  Does patient have an advance directive? Yes  Type of Advance Directive Out of facility DNR (pink MOST or yellow form)  Does patient want to make changes to advanced directive? No - Patient declined  Copy of advanced directive(s) in chart? Yes       Extended Emergency Contact Information Primary Emergency Contact: Golda [place] Home Phone: [phone] Mobile Phone: [phone] Relation: Daughter Secondary Emergency Contact: Joyce,Lee Address: [address]          [place] [address] [place] Home Phone: [phone] Mobile Phone: [phone] Relation: Daughter   Allergies  Allergen Reactions  . Prozac [Fluoxetine Hcl] Anaphylaxis, Hives, Itching, Swelling and Rash  . Cymbalta [Duloxetine Hcl]   . Sulfa Antibiotics   . Codeine Itching and Rash    Chief Complaint  Patient presents with  . Medical Management of Chronic Issues    Routine Visit     HPI:  82 y.o. patient is seen for routine visit. She has been at her baseline. No new concern from staff or patient. No fall reported. She is out of bed daily. She feeds herself and required 1 person assist with transfers. She would like to come off some of her medications if possible.   Review of Systems   Constitutional: Negative for fever.   HENT: negative for headache, blurred vision Respiratory: Negative for shortness of breath and wheezing.    Cardiovascular: Negative for chest pain.   Gastrointestinal: Negative for heartburn, nausea, vomiting, abdominal pain Genitourinary: Negative for dysuria.   Musculoskeletal: Negative for fall   Neurological: Negative for dizziness   Past Medical History:  Diagnosis Date  . Anxiety   . COPD (chronic obstructive pulmonary disease) (HCC)   .  Depression   . Gastritis   . H/O arteritis    Temporal  . Headache(784.0)    temple artiritis  . Hypertension   . Pneumonia 05/2012  . Stroke [facility])    left sided weakness- transfers with walke rand assiast  . Thyroid nodule    Past Surgical History:  Procedure Laterality Date  . APPENDECTOMY    . CHOLECYSTECTOMY    . ESOPHAGOGASTRODUODENOSCOPY  06/08/2012   Procedure: ESOPHAGOGASTRODUODENOSCOPY (EGD);  Surgeon: Florencia Reasons, MD;  Location: [facility];  Service: Endoscopy;  Laterality: N/A;  . HERNIA REPAIR    . KYPHOPLASTY  2012  . KYPHOPLASTY N/A 11/24/2012   Procedure: KYPHOPLASTY;  Surgeon: Barnett Abu, MD;  Location: [facility] NEURO ORS;  Service: Neurosurgery;  Laterality: N/A;  Thoracic Five Thoracic Six and Thoracic Seven Acrylic Balloon Kyphoplasty  . OOPHORECTOMY    . THYROID SURGERY      Medications:   Medication List       Accurate as of 04/24/16  2:54 PM. Always use your most recent med list.          acetaminophen 325 MG tablet Commonly known as:  TYLENOL Take 650 mg by mouth every 4 (four) hours as needed.   calcium-vitamin D 500-200 MG-UNIT tablet Commonly known as:  OSCAL WITH D Take 1 tablet by mouth 2 (two) times daily.   clopidogrel 75 MG tablet Commonly known as:  PLAVIX Take 75 mg by mouth daily.   Cranberry 200 MG Caps Take 400 mg by mouth 2 (two) times daily.   desvenlafaxine 50  MG 24 hr tablet Commonly known as:  PRISTIQ Take 50 mg by mouth daily.   fentaNYL 25 MCG/HR patch Commonly known as:  DURAGESIC - dosed mcg/hr Apply one patch every 72 hours for pain. Remove old patch. External Use only. Rotate sites. Check placement of patch every shift   ferrous sulfate 325 (65 FE) MG tablet Take 325 mg by mouth daily with breakfast.   Fluticasone-Salmeterol 250-50 MCG/DOSE Aepb Commonly known as:  ADVAIR Inhale 1 puff into the lungs every 12 (twelve) hours. Rinse mouth after use   folic acid 1 MG tablet Commonly known as:  FOLVITE Take 1  mg by mouth daily. For supplement   gabapentin 100 MG capsule Commonly known as:  NEURONTIN Take 100 mg by mouth 3 (three) times daily.   levothyroxine 25 MCG tablet Commonly known as:  SYNTHROID, LEVOTHROID Take 25 mcg by mouth daily before breakfast.   magnesium oxide 400 MG tablet Commonly known as:  MAG-OX Take 400 mg by mouth daily.   mirtazapine 30 MG tablet Commonly known as:  REMERON Take 30 mg by mouth at bedtime.   ondansetron 4 MG tablet Commonly known as:  ZOFRAN Take 4 mg by mouth every 6 (six) hours as needed for nausea or vomiting.   polyethylene glycol powder powder Commonly known as:  GLYCOLAX/MIRALAX Take 17 g by mouth daily.   polyvinyl alcohol 1.4 % ophthalmic solution Commonly known as:  LIQUIFILM TEARS Place 1 drop into both eyes 2 (two) times daily.   predniSONE 1 MG tablet Commonly known as:  DELTASONE Take 1 tablet (1 mg total) by mouth daily.   PROTONIX 20 MG tablet Generic drug:  pantoprazole Take 20 mg by mouth 2 (two) times daily.   senna 8.6 MG Tabs tablet Commonly known as:  SENOKOT Take 1 tablet by mouth at bedtime.   vitamin B-12 1000 MCG tablet Commonly known as:  CYANOCOBALAMIN Take 1,000 mcg by mouth daily.   Vitamin D3 2000 units Tabs Take by mouth. 1 by mouth daily for Vit D Deficiency       Immunizations: Immunization History  Administered Date(s) Administered  . Influenza-Unspecified 07/01/2013, 06/21/2014, 06/27/2015  . PPD Test 05/28/2013, 05/31/2014  . Pneumococcal-Unspecified 06/18/2011, 07/30/2013     Physical Exam:  Vitals:   04/24/16 1228  BP: (!) 144/60  Pulse: 75  Resp: 18  Temp: 98.1 F (36.7 C)  TempSrc: Oral  SpO2: 97%  Weight: 143 lb 3.2 oz (65 kg)  Height: 5\' 6"  (1.676 m)   Body mass index is 23.11 kg/m.  General- elderly female, frail built, in no acute distress Head- normocephalic, atraumatic Skin- warm and dry Eyes- PERRLA, EOMI, has corrective glasses Neck- no  thyromegaly Mouth- normal mucus membrane Cardiovascular- normal s1,s2, no murmur Respiratory- bilateral clear to auscultation, no wheeze, no rhonchi, no crackles Abdomen- bowel sounds present, soft, non tender, no guarding or rigidity Musculoskeletal: left hemiplegia, normal ROM on right side. Assist with transfers. Left hand contracted. Bilateral foot drop Lymphadenopathy: She has no cervical adenopathy.  Neurological: She is alert and oriented  Psychiatric: normal mood and affect    Labs reviewed: Basic Metabolic Panel:  Recent Labs  16/06/9611/30/16 11/14/15 12/26/15  NA 138 142 141  K 4.5 4.5 4.9  BUN 15 16 16   CREATININE 1.3* 0.9 0.8   Liver Function Tests:  Recent Labs  11/14/15 12/26/15  AST 19 15  ALT 13 11  ALKPHOS 87 119   No results for input(s): LIPASE, AMYLASE in the last  8760 hours. No results for input(s): AMMONIA in the last 8760 hours. CBC:  Recent Labs  11/14/15 11/21/15 12/26/15  WBC 7.0 7.4 10.2  HGB 11.9* 12.5 13.0  HCT 39 40 42  PLT 284 257 327    Assessment/Plan  Hemiplegia and hemiparesis following CVA Continue plavix, to wear her splint. Assistance with ADLs to be provided.   Primary generalized OA Continue tylenol 650 mg q4h prn pain and fentanyl patch.   Opioid dependence Continue her fentanyl patch q3 days and monitor.  gerd with esophagitis Stable, continue protonix 20 mg bid. Esophagitis noted in EGD 06/08/12  Reviewed her med list and explained to patient about indication for her meds and need to continue them for now. She voices understanding it.    Goals of care: short term rehabilitation   Labs/tests ordered:  Family/ staff Communication: reviewed care plan with patient and nursing supervisor    Oneal Grout, MD Internal Medicine [facility] [address] [place], [place] [address] Cell Phone (Monday-Friday 8 am - 5 pm): [phone] On Call: [phone] and follow prompts after 5 pm  and on weekends Office Phone: [phone] Office Fax: [phone]

## 2016-05-10 ENCOUNTER — Encounter: Payer: Self-pay | Admitting: Sports Medicine

## 2016-05-10 ENCOUNTER — Ambulatory Visit (INDEPENDENT_AMBULATORY_CARE_PROVIDER_SITE_OTHER): Payer: PRIVATE HEALTH INSURANCE | Admitting: Sports Medicine

## 2016-05-10 DIAGNOSIS — M79675 Pain in left toe(s): Secondary | ICD-10-CM | POA: Diagnosis not present

## 2016-05-10 DIAGNOSIS — M79674 Pain in right toe(s): Secondary | ICD-10-CM | POA: Diagnosis not present

## 2016-05-10 DIAGNOSIS — I739 Peripheral vascular disease, unspecified: Secondary | ICD-10-CM | POA: Diagnosis not present

## 2016-05-10 DIAGNOSIS — B351 Tinea unguium: Secondary | ICD-10-CM

## 2016-05-10 DIAGNOSIS — M2040 Other hammer toe(s) (acquired), unspecified foot: Secondary | ICD-10-CM

## 2016-05-11 NOTE — Progress Notes (Signed)
Subjective: Erica French is a 82 y.o. female patient seen today in office with complaint of painful thickened and elongated toenails; unable to trim. Patient is assisted by daughter who states that mom lives at [facility] and she thought the podiatrist there was doing the nails but noticed that her mom was never being seen; denies history of Diabetes or Neuropathy. Admits to history of stroke with weakness. Patient has no other pedal complaints at this time.   Patient Active Problem List   Diagnosis Date Noted  . CKD (chronic kidney disease) stage 3, GFR 30-59 ml/min 03/27/2016  . PVD (peripheral vascular disease) (HCC) 03/27/2016  . Moderate protein-calorie malnutrition (HCC) 02/22/2016  . Recurrent major depressive disorder (HCC) 02/22/2016  . Uncomplicated opioid dependence (HCC) 04/13/2015  . Age-related osteoporosis without current pathological fracture 04/13/2015  . Major depression, chronic (HCC) 02/03/2015  . Thyroid activity decreased 02/03/2015  . Anemia of chronic disease 01/11/2015  . Vitamin B12 deficiency anemia 01/02/2015  . Postsurgical hypothyroidism 01/02/2015  . Dry eyes 09/21/2014  . Malnutrition of moderate degree (HCC) 09/21/2014  . Primary generalized (osteo)arthritis 09/21/2014  . Hypothyroidism 06/26/2014  . Giant cell arteritis (HCC) 05/05/2014  . Hemiplegia and hemiparesis following cerebral infarction affecting left non-dominant side (HCC) 05/05/2014  . Generalized osteoarthrosis, involving multiple sites 05/05/2014  . Esophageal reflux 05/05/2014  . Chronic depressive disorder 04/11/2014  . Iron deficiency 04/11/2014  . Major depressive disorder, recurrent episode, unspecified 04/11/2014  . Nonspecific elevation of levels of transaminase or lactic acid dehydrogenase (LDH) 04/11/2014  . Autoimmune disorder (HCC) 01/13/2014  . UTI (urinary tract infection) 01/13/2014  . Generalized osteoarthrosis, unspecified site 12/19/2013  . Senile osteoporosis  12/19/2013  . Autoimmune disease, not elsewhere classified(279.49) 12/19/2013  . Cystitis 10/28/2013  . Dermatitis 10/28/2013  . Insomnia 10/04/2013  . Osteopenia 08/23/2013  . Acute exacerbation of chronic low back pain 07/15/2013  . Anemia, iron deficiency 07/15/2013  . CVA, old, ataxia 07/15/2013  . Osteoarthritis 04/26/2013  . Temporal arteritis (HCC) 04/26/2013  . Essential hypertension, benign 04/24/2013  . UTI (lower urinary tract infection) 04/24/2013  . Depression 02/05/2013  . Rash and nonspecific skin eruption 02/05/2013  . Chronic lower back pain 12/08/2012  . Loss of weight 12/08/2012  . Osteoporosis 12/08/2012  . Dysphagia, unspecified(787.20) 12/08/2012  . Hx of arterial ischemic stroke 06/05/2012  . COPD (chronic obstructive pulmonary disease) (HCC) 06/05/2012  . Abdominal pain, other specified site 06/05/2012    Current Outpatient Prescriptions on File Prior to Visit  Medication Sig Dispense Refill  . acetaminophen (TYLENOL) 325 MG tablet Take 650 mg by mouth every 4 (four) hours as needed.    . calcium-vitamin D (OSCAL WITH D) 500-200 MG-UNIT per tablet Take 1 tablet by mouth 2 (two) times daily.    . Cholecalciferol (VITAMIN D3) 2000 UNITS TABS Take by mouth. 1 by mouth daily for Vit D Deficiency    . clopidogrel (PLAVIX) 75 MG tablet Take 75 mg by mouth daily.    . Cranberry 200 MG CAPS Take 400 mg by mouth 2 (two) times daily.    Marland Kitchen desvenlafaxine (PRISTIQ) 50 MG 24 hr tablet Take 50 mg by mouth daily.    . fentaNYL (DURAGESIC - DOSED MCG/HR) 25 MCG/HR patch Apply one patch every 72 hours for pain. Remove old patch. External Use only. Rotate sites. Check placement of patch every shift 10 patch 0  . ferrous sulfate 325 (65 FE) MG tablet Take 325 mg by mouth daily with breakfast.    .  Fluticasone-Salmeterol (ADVAIR) 250-50 MCG/DOSE AEPB Inhale 1 puff into the lungs every 12 (twelve) hours. Rinse mouth after use    . folic acid (FOLVITE) 1 MG tablet Take 1 mg by  mouth daily. For supplement    . gabapentin (NEURONTIN) 100 MG capsule Take 100 mg by mouth 3 (three) times daily.    Marland Kitchen. levothyroxine (SYNTHROID, LEVOTHROID) 25 MCG tablet Take 25 mcg by mouth daily before breakfast.    . magnesium oxide (MAG-OX) 400 MG tablet Take 400 mg by mouth daily.    . mirtazapine (REMERON) 30 MG tablet Take 30 mg by mouth at bedtime.    . ondansetron (ZOFRAN) 4 MG tablet Take 4 mg by mouth every 6 (six) hours as needed for nausea or vomiting.    . pantoprazole (PROTONIX) 20 MG tablet Take 20 mg by mouth 2 (two) times daily.     . polyethylene glycol powder (GLYCOLAX/MIRALAX) powder Take 17 g by mouth daily. 3350 g 1  . polyvinyl alcohol (LIQUIFILM TEARS) 1.4 % ophthalmic solution Place 1 drop into both eyes 2 (two) times daily.     . predniSONE (DELTASONE) 1 MG tablet Take 1 tablet (1 mg total) by mouth daily. 30 tablet 3  . senna (SENOKOT) 8.6 MG TABS tablet Take 1 tablet by mouth at bedtime.    . vitamin B-12 (CYANOCOBALAMIN) 1000 MCG tablet Take 1,000 mcg by mouth daily.     No current facility-administered medications on file prior to visit.     Allergies  Allergen Reactions  . Prozac [Fluoxetine Hcl] Anaphylaxis, Hives, Itching, Swelling and Rash  . Cymbalta [Duloxetine Hcl]   . Sulfa Antibiotics   . Codeine Itching and Rash    Objective: Physical Exam  General: Well developed, nourished, no acute distress, awake, alert and oriented x 3 in wheelchair  Vascular: Dorsalis pedis artery 1/4 bilateral, Posterior tibial artery 1/4 bilateral, skin temperature warm to cool proximal to distal bilateral lower extremities, + varicosities, no pedal hair present bilateral.  Neurological: Gross sensation present via light touch bilateral.   Dermatological: Skin is warm, dry, and supple bilateral, Nails 1-10 are tender, long, thick, and discolored with mild subungal debris, no webspace macerations present bilateral, no open lesions present bilateral, no  callus/corns/hyperkeratotic tissue present bilateral. No signs of infection bilateral.  Musculoskeletal: Asymptomatic boney hammertoe deformities noted bilateral. Muscular strength 4/5 without painon range of motion. No pain with calf compression bilateral.  Assessment and Plan:  Problem List Items Addressed This Visit      Cardiovascular and Mediastinum   PVD (peripheral vascular disease) (HCC)    Other Visit Diagnoses    Dermatophytosis of nail    -  Primary   Toe pain, bilateral       Hammertoe, unspecified laterality         -Examined patient.  -Discussed treatment options for painful mycotic nails. -Mechanically debrided and reduced mycotic nails with sterile nail nipper and dremel nail file without incident. -Patient to return in 3 months for follow up evaluation or sooner if symptoms worsen.  Asencion Islamitorya Mackenzee Becvar, DPM

## 2016-05-14 LAB — CBC AND DIFFERENTIAL
HCT: 44 % (ref 36–46)
Hemoglobin: 13.4 g/dL (ref 12.0–16.0)
Platelets: 279 10*3/uL (ref 150–399)
WBC: 7.2 10^3/mL

## 2016-05-14 LAB — BASIC METABOLIC PANEL
BUN: 13 mg/dL (ref 4–21)
CREATININE: 0.9 mg/dL (ref 0.5–1.1)
Glucose: 112 mg/dL
Potassium: 4.2 mmol/L (ref 3.4–5.3)
SODIUM: 145 mmol/L (ref 137–147)

## 2016-05-15 ENCOUNTER — Other Ambulatory Visit: Payer: Self-pay

## 2016-05-15 ENCOUNTER — Other Ambulatory Visit: Payer: Self-pay | Admitting: *Deleted

## 2016-05-15 LAB — LIPID PANEL
Cholesterol: 182 mg/dL (ref 0–200)
HDL: 59 mg/dL (ref 35–70)
LDL Cholesterol: 98 mg/dL
TRIGLYCERIDES: 126 mg/dL (ref 40–160)

## 2016-05-15 MED ORDER — FENTANYL 25 MCG/HR TD PT72: MEDICATED_PATCH | TRANSDERMAL | 0 refills | Status: DC

## 2016-05-15 NOTE — Telephone Encounter (Signed)
[name] [phone] Fax: [phone]  

## 2016-05-15 NOTE — Telephone Encounter (Signed)
Rx faxed to [facility] @ [phone], phone number [phone]  

## 2016-05-23 ENCOUNTER — Encounter: Payer: Self-pay | Admitting: Internal Medicine

## 2016-05-23 ENCOUNTER — Non-Acute Institutional Stay (SKILLED_NURSING_FACILITY): Payer: Medicare Other | Admitting: Internal Medicine

## 2016-05-23 DIAGNOSIS — K2100 Gastro-esophageal reflux disease with esophagitis, without bleeding: Secondary | ICD-10-CM

## 2016-05-23 DIAGNOSIS — E890 Postprocedural hypothyroidism: Secondary | ICD-10-CM | POA: Diagnosis not present

## 2016-05-23 DIAGNOSIS — M316 Other giant cell arteritis: Secondary | ICD-10-CM | POA: Diagnosis not present

## 2016-05-23 DIAGNOSIS — G8194 Hemiplegia, unspecified affecting left nondominant side: Secondary | ICD-10-CM

## 2016-05-23 DIAGNOSIS — F329 Major depressive disorder, single episode, unspecified: Secondary | ICD-10-CM

## 2016-05-23 DIAGNOSIS — F1120 Opioid dependence, uncomplicated: Secondary | ICD-10-CM | POA: Diagnosis not present

## 2016-05-23 DIAGNOSIS — G4700 Insomnia, unspecified: Secondary | ICD-10-CM

## 2016-05-23 DIAGNOSIS — I739 Peripheral vascular disease, unspecified: Secondary | ICD-10-CM | POA: Diagnosis not present

## 2016-05-23 DIAGNOSIS — I10 Essential (primary) hypertension: Secondary | ICD-10-CM | POA: Diagnosis not present

## 2016-05-23 DIAGNOSIS — Z0000 Encounter for general adult medical examination without abnormal findings: Secondary | ICD-10-CM

## 2016-05-23 DIAGNOSIS — D509 Iron deficiency anemia, unspecified: Secondary | ICD-10-CM | POA: Diagnosis not present

## 2016-05-23 DIAGNOSIS — E559 Vitamin D deficiency, unspecified: Secondary | ICD-10-CM | POA: Diagnosis not present

## 2016-05-23 NOTE — Progress Notes (Signed)
LOCATION: Facility[facility]    PCP: Oneal GroutPANDEY, Athina Fahey, MD   Code Status: DNR  Goals of care: Advanced Directive information Advanced Directives 03/27/2016  Does patient have an advance directive? Yes  Type of Advance Directive Out of facility DNR (pink MOST or yellow form)  Does patient want to make changes to advanced directive? No - Patient declined  Copy of advanced directive(s) in chart? Yes       Extended Emergency Contact Information Primary Emergency Contact: Golda AcreGlosson,Patt  [place] Home Phone: [phone] Mobile Phone: [phone] Relation: Daughter Secondary Emergency Contact: Joyce,Lee Address: [address]          [place] [id number] [place] Home Phone: [phone] Mobile Phone: [phone] Relation: Daughter   Allergies  Allergen Reactions  . Prozac [Fluoxetine Hcl] Anaphylaxis, Hives, Itching, Swelling and Rash  . Cymbalta [Duloxetine Hcl]   . Sulfa Antibiotics   . Codeine Itching and Rash    Chief Complaint  Patient presents with  . Annual Exam    Annual Visit     HPI:  82 y.o. patient is seen for annual visit. She has been at her baseline. Her oral intake has been fair. No fall reported by nursing. She is out of bed daily. No pressure ulcer or wound reported. She complaints of trouble falling asleep at night and would like to be on a sleeping aid. Her mood has been fair. She is compliant with her medications. She feeds herself and required 1 person assist with transfers.    Review of Systems   Constitutional: Negative for fever, chills, diaphoresis.   HENT: negative for headache, blurred vision, nasal discharge Respiratory: Negative for cough, shortness of breath and wheezing.    Cardiovascular: Negative for chest pain and palpitation.   Gastrointestinal: Negative for heartburn, nausea, vomiting, abdominal pain. Genitourinary: Negative for dysuria.   Musculoskeletal: Negative  for fall   Neurological: Negative for dizziness Psychiatry: Negative for depression    Past Medical History:  Diagnosis Date  . Anxiety   . COPD (chronic obstructive pulmonary disease) (HCC)   . Depression   . Gastritis   . H/O arteritis    Temporal  . Headache(784.0)    temple artiritis  . Hypertension   . Pneumonia 05/2012  . Stroke [facility](HCC)    left sided weakness- transfers with walke rand assiast  . Thyroid nodule    Past Surgical History:  Procedure Laterality Date  . APPENDECTOMY    . CHOLECYSTECTOMY    . ESOPHAGOGASTRODUODENOSCOPY  06/08/2012   Procedure: ESOPHAGOGASTRODUODENOSCOPY (EGD);  Surgeon: Florencia Reasonsobert V Buccini, MD;  [facility];  Service: Endoscopy;  Laterality: N/A;  . HERNIA REPAIR    . KYPHOPLASTY  2012  . KYPHOPLASTY N/A 11/24/2012   Procedure: KYPHOPLASTY;  Surgeon: Barnett AbuHenry Elsner, MD;  Location: [facility] NEURO ORS;  Service: Neurosurgery;  Laterality: N/A;  Thoracic Five Thoracic Six and Thoracic Seven Acrylic Balloon Kyphoplasty  . OOPHORECTOMY    . THYROID SURGERY     No family history on file.  Medications:   Medication List       Accurate as of 05/23/16  1:14 PM. Always use your most recent med list.          acetaminophen 325 MG tablet Commonly known as:  TYLENOL Take 650 mg by mouth every 4 (four) hours as needed.   calcium-vitamin D 500-200 MG-UNIT tablet Commonly known as:  OSCAL WITH D Take 1 tablet by mouth 2 (two)  times daily.   clopidogrel 75 MG tablet Commonly known as:  PLAVIX Take 75 mg by mouth daily.   Cranberry 200 MG Caps Take 400 mg by mouth 2 (two) times daily.   desvenlafaxine 50 MG 24 hr tablet Commonly known as:  PRISTIQ Take 50 mg by mouth daily.   fentaNYL 25 MCG/HR patch Commonly known as:  DURAGESIC - dosed mcg/hr Apply one patch every 72 hours for pain. Remove old patch. External Use only. Rotate sites. Check placement of patch every shift   ferrous sulfate 325 (65 FE) MG tablet Take 325 mg by mouth daily  with breakfast.   Fluticasone-Salmeterol 250-50 MCG/DOSE Aepb Commonly known as:  ADVAIR Inhale 1 puff into the lungs every 12 (twelve) hours. Rinse mouth after use   folic acid 1 MG tablet Commonly known as:  FOLVITE Take 1 mg by mouth daily. For supplement   gabapentin 100 MG capsule Commonly known as:  NEURONTIN Take 100 mg by mouth 3 (three) times daily.   levothyroxine 25 MCG tablet Commonly known as:  SYNTHROID, LEVOTHROID Take 25 mcg by mouth daily before breakfast.   magnesium oxide 400 MG tablet Commonly known as:  MAG-OX Take 400 mg by mouth daily.   mirtazapine 30 MG tablet Commonly known as:  REMERON Take 30 mg by mouth at bedtime.   ondansetron 4 MG tablet Commonly known as:  ZOFRAN Take 4 mg by mouth every 6 (six) hours as needed for nausea or vomiting.   polyethylene glycol powder powder Commonly known as:  GLYCOLAX/MIRALAX Take 17 g by mouth daily.   polyvinyl alcohol 1.4 % ophthalmic solution Commonly known as:  LIQUIFILM TEARS Place 1 drop into both eyes 2 (two) times daily.   predniSONE 1 MG tablet Commonly known as:  DELTASONE Take 1 tablet (1 mg total) by mouth daily.   PROTONIX 20 MG tablet Generic drug:  pantoprazole Take 20 mg by mouth 2 (two) times daily.   senna 8.6 MG Tabs tablet Commonly known as:  SENOKOT Take 1 tablet by mouth at bedtime.   vitamin B-12 1000 MCG tablet Commonly known as:  CYANOCOBALAMIN Take 1,000 mcg by mouth daily.   Vitamin D3 2000 units Tabs Take by mouth. 1 by mouth daily for Vit D Deficiency       Immunizations: Immunization History  Administered Date(s) Administered  . Influenza-Unspecified 07/01/2013, 06/21/2014, 06/27/2015  . PPD Test 05/28/2013, 05/31/2014  . Pneumococcal-Unspecified 06/18/2011, 07/30/2013     Physical Exam:  Vitals:   05/23/16 1302  BP: 120/86  Pulse: 68  Temp: 99.6 F (37.6 C)  TempSrc: Oral  SpO2: 99%  Weight: 144 lb 1.6 oz (65.4 kg)  Height: 5\' 6"  (1.676 m)    Body mass index is 23.26 kg/m.  Wt Readings from Last 3 Encounters:  05/23/16 144 lb 1.6 oz (65.4 kg)  04/24/16 143 lb 3.2 oz (65 kg)  03/27/16 140 lb 12.8 oz (63.9 kg)    General- elderly female, frail, in no acute distress Head- normocephalic, atraumatic Skin- warm and dry Eyes- PERRLA, EOMI, has corrective glasses Neck- no JVD, no cervical lmphadenopathy Mouth- moist mucus membrane Breast - deferred Cardiovascular- normal s1,s2, no murmur Respiratory- bilateral clear to auscultation, no wheeze, no rhonchi, no crackles Abdomen- bowel sounds present, soft, non tender, no guarding or rigidity Musculoskeletal: left hemiplegia, normal ROM on right side. Assist with transfers. Left hand contracted and splint in place. Bilateral foot drop. Muscle wasting present Pelvic exam- refused Neurological: alert and oriented, can carry  out conversation Psychiatric: normal mood and affect     Labs reviewed: Basic Metabolic Panel:  Recent Labs  96/04/54 11/14/15 12/26/15  NA 138 142 141  K 4.5 4.5 4.9  BUN 15 16 16   CREATININE 1.3* 0.9 0.8   Liver Function Tests:  Recent Labs  11/14/15 12/26/15  AST 19 15  ALT 13 11  ALKPHOS 87 119   No results for input(s): LIPASE, AMYLASE in the last 8760 hours. No results for input(s): AMMONIA in the last 8760 hours. CBC:  Recent Labs  11/14/15 11/21/15 12/26/15  WBC 7.0 7.4 10.2  HGB 11.9* 12.5 13.0  HCT 39 40 42  PLT 284 257 327     Assessment/Plan  Hypertension Stable bp reading. Off all BP medication. monitor  Vitamin d deficiency Continue vitamin d supplement 2000 iu daily  Temporal arteritis Denies any vision problem or headache this visit. Continue prednisone 1 mg daily.  PVD Continue clopidogrel daily  Insomnia Start melatonin 5 mg qhs at 9 pm and monitor  Annual visit 82 y/o patient seen today at bedside. She has aged out for screening exams like colonoscopy, pap smear and mammogram. She has been at her  baseline, able to express her needs and has been compliant with her medication. She needs some assistance with transfer because of residual weakness from old CVA. She will be receiving influenza vaccination this flu season. To be out of bed daily. Will have a BIMS performed to assess her cognition. She is a fall risk with her left sided weakness post CVA, thus fall precautions to be taken  Major Depression Continue desvenlafaxine 50 mg daily with remeron 30 mg daily and monitor, followed by psych services  Iron deficiency anemia Continue feso4 325 mg daily. Reviewed h&h  gerd with esophagitis Stable, continue protonix 20 mg bid. Esophagitis noted in EGD 06/08/12  Hypothyroidism Lab Results  Component Value Date   TSH 2.35 12/26/2015   Continue levothyroxine and monitor  Hemiplegia following CVA Continue plavix, to wear her splint to left arm. Assistance with ADLs to be provided.   Opioid dependence Continue her fentanyl patch q3 days and monitor.    Goals of care: long term care   Labs/tests ordered: cbc, cmp, lipid panel and vitamin d level pending  Family/ staff Communication: reviewed care plan with patient and nursing supervisor    Oneal Grout, MD Internal Medicine [facility] [address] [place], [place] [address] Cell Phone (Monday-Friday 8 am - 5 pm): [phone] On Call: [phone] and follow prompts after 5 pm and on weekends Office Phone: [phone] Office Fax: [phone]

## 2016-06-12 ENCOUNTER — Other Ambulatory Visit: Payer: Self-pay | Admitting: *Deleted

## 2016-06-12 MED ORDER — FENTANYL 25 MCG/HR TD PT72: MEDICATED_PATCH | TRANSDERMAL | 0 refills | Status: DC

## 2016-06-12 NOTE — Telephone Encounter (Signed)
[name] [phone] Fax: [phone]  

## 2016-06-18 ENCOUNTER — Other Ambulatory Visit: Payer: Self-pay | Admitting: Oncology

## 2016-06-20 ENCOUNTER — Encounter: Payer: Self-pay | Admitting: Internal Medicine

## 2016-06-20 ENCOUNTER — Non-Acute Institutional Stay (SKILLED_NURSING_FACILITY): Payer: Medicare Other | Admitting: Internal Medicine

## 2016-06-20 DIAGNOSIS — K2100 Gastro-esophageal reflux disease with esophagitis, without bleeding: Secondary | ICD-10-CM

## 2016-06-20 DIAGNOSIS — I69354 Hemiplegia and hemiparesis following cerebral infarction affecting left non-dominant side: Secondary | ICD-10-CM | POA: Diagnosis not present

## 2016-06-20 DIAGNOSIS — E559 Vitamin D deficiency, unspecified: Secondary | ICD-10-CM | POA: Diagnosis not present

## 2016-06-20 NOTE — Progress Notes (Signed)
LOCATION: Facility: [facility]    PCP: Oneal Grout, MD   Code Status: DNR  Goals of care: Advanced Directive information Advanced Directives 03/27/2016  Does patient have an advance directive? Yes  Type of Advance Directive Out of facility DNR (pink MOST or yellow form)  Does patient want to make changes to advanced directive? No - Patient declined  Copy of advanced directive(s) in chart? Yes       Extended Emergency Contact Information Primary Emergency Contact: Golda [place] Home Phone: [phone] Mobile Phone: [phone] Relation: Daughter Secondary Emergency Contact: Joyce,Lee Address: [address]          [place] [address] [place] Home Phone: [phone] Mobile Phone: [phone] Relation: Daughter   Allergies  Allergen Reactions  . Prozac [Fluoxetine Hcl] Anaphylaxis, Hives, Itching, Swelling and Rash  . Cymbalta [Duloxetine Hcl]   . Sulfa Antibiotics   . Codeine Itching and Rash    Chief Complaint  Patient presents with  . Medical Management of Chronic Issues    Routine Visit     HPI:  82 y.o. patient is seen for routine visit. She has been at her baseline. Melatonin has helped some with her sleep. Denies any concern  Review of Systems   Constitutional: Negative for fever, chills   HENT: negative for headache, blurred vision, nasal discharge Respiratory: Negative for cough, shortness of breath and wheezing.    Cardiovascular: Negative for chest pain and palpitation.   Gastrointestinal: Negative for heartburn, nausea, vomiting, abdominal pain. Genitourinary: Negative for dysuria.   Musculoskeletal: Negative for fall   Neurological: Negative for dizziness Psychiatry: Negative for depression    Past Medical History:  Diagnosis Date  . Anxiety   . COPD (chronic obstructive pulmonary disease) (HCC)   . Depression   . Gastritis   . H/O arteritis    Temporal  .  Headache(784.0)    temple artiritis  . Hypertension   . Pneumonia 05/2012  . Stroke [facility])    left sided weakness- transfers with walke rand assiast  . Thyroid nodule    Past Surgical History:  Procedure Laterality Date  . APPENDECTOMY    . CHOLECYSTECTOMY    . ESOPHAGOGASTRODUODENOSCOPY  06/08/2012   Procedure: ESOPHAGOGASTRODUODENOSCOPY (EGD);  Surgeon: Florencia Reasons, MD;  Location: [facility] ENDOSCOPY;  Service: Endoscopy;  Laterality: N/A;  . HERNIA REPAIR    . KYPHOPLASTY  2012  . KYPHOPLASTY N/A 11/24/2012   Procedure: KYPHOPLASTY;  Surgeon: Barnett Abu, MD;  Location: [facility] NEURO ORS;  Service: Neurosurgery;  Laterality: N/A;  Thoracic Five Thoracic Six and Thoracic Seven Acrylic Balloon Kyphoplasty  . OOPHORECTOMY    . THYROID SURGERY     No family history on file.  Medications:   Medication List       Accurate as of 06/20/16  3:14 PM. Always use your most recent med list.          acetaminophen 325 MG tablet Commonly known as:  TYLENOL Take 650 mg by mouth every 4 (four) hours as needed.   calcium-vitamin D 500-200 MG-UNIT tablet Commonly known as:  OSCAL WITH D Take 1 tablet by mouth 2 (two) times daily.   clopidogrel 75 MG tablet Commonly known as:  PLAVIX Take 75 mg by mouth daily.   Cranberry 200 MG Caps Take 400 mg by mouth 2 (two) times daily.   desvenlafaxine 50 MG 24 hr tablet Commonly known as:  PRISTIQ Take 50 mg by  mouth daily.   fentaNYL 25 MCG/HR patch Commonly known as:  DURAGESIC - dosed mcg/hr Apply one patch every 72 hours for pain. Remove old patch. External Use only. Rotate sites. Check placement of patch every shift   ferrous sulfate 325 (65 FE) MG tablet Take 325 mg by mouth daily with breakfast.   Fluticasone-Salmeterol 250-50 MCG/DOSE Aepb Commonly known as:  ADVAIR Inhale 1 puff into the lungs every 12 (twelve) hours. Rinse mouth after use   folic acid 1 MG tablet Commonly known as:  FOLVITE Take 1 mg by mouth daily. For  supplement   gabapentin 100 MG capsule Commonly known as:  NEURONTIN Take 100 mg by mouth 3 (three) times daily.   levothyroxine 25 MCG tablet Commonly known as:  SYNTHROID, LEVOTHROID Take 25 mcg by mouth daily before breakfast.   LORazepam 1 MG tablet Commonly known as:  ATIVAN Take 0.25 mg by mouth 2 (two) times daily as needed for anxiety.   magnesium oxide 400 MG tablet Commonly known as:  MAG-OX Take 400 mg by mouth daily.   Melatonin 5 MG Tabs Take 1 tablet by mouth at bedtime.   mirtazapine 30 MG tablet Commonly known as:  REMERON Take 30 mg by mouth at bedtime.   ondansetron 4 MG tablet Commonly known as:  ZOFRAN Take 4 mg by mouth every 6 (six) hours as needed for nausea or vomiting.   polyethylene glycol powder powder Commonly known as:  GLYCOLAX/MIRALAX Take 17 g by mouth daily.   polyvinyl alcohol 1.4 % ophthalmic solution Commonly known as:  LIQUIFILM TEARS Place 1 drop into both eyes 2 (two) times daily.   predniSONE 1 MG tablet Commonly known as:  DELTASONE Take 1 tablet (1 mg total) by mouth daily.   PROTONIX 20 MG tablet Generic drug:  pantoprazole Take 20 mg by mouth 2 (two) times daily.   senna 8.6 MG Tabs tablet Commonly known as:  SENOKOT Take 1 tablet by mouth at bedtime.   vitamin B-12 1000 MCG tablet Commonly known as:  CYANOCOBALAMIN Take 1,000 mcg by mouth daily.   Vitamin D3 2000 units Tabs Take by mouth. 1 by mouth daily for Vit D Deficiency       Immunizations: Immunization History  Administered Date(s) Administered  . Influenza-Unspecified 07/01/2013, 06/21/2014, 06/27/2015  . PPD Test 05/28/2013, 05/31/2014  . Pneumococcal-Unspecified 06/18/2011, 07/30/2013     Physical Exam:  Vitals:   06/20/16 1507  BP: (!) 143/66  Pulse: 95  Resp: 18  Temp: (!) 96.6 F (35.9 C)  TempSrc: Oral  SpO2: 93%  Weight: 144 lb 1.6 oz (65.4 kg)  Height: 5\' 6"  (1.676 m)   Body mass index is 23.26 kg/m.  Wt Readings from Last  3 Encounters:  06/20/16 144 lb 1.6 oz (65.4 kg)  05/23/16 144 lb 1.6 oz (65.4 kg)  04/24/16 143 lb 3.2 oz (65 kg)    General- elderly female, frail, in no acute distress Head- normocephalic, atraumatic Skin- warm and dry Eyes- PERRLA, EOMI, has corrective glasses Mouth- moist mucus membrane Cardiovascular- normal s1,s2, no murmur Respiratory- bilateral clear to auscultation, no wheeze, no rhonchi, no crackles Abdomen- bowel sounds present, soft, non tender Musculoskeletal: left hemiplegia, normal ROM on right side. Assist with transfers. Left hand contracted and splint in place. Bilateral foot drop. Muscle wasting present Neurological: alert and oriented, can carry out conversation Psychiatric: normal mood and affect     Labs reviewed: Basic Metabolic Panel:  Recent Labs  40/98/11 12/26/15 05/14/16  NA  142 141 145  K 4.5 4.9 4.2  BUN 16 16 13   CREATININE 0.9 0.8 0.9   Liver Function Tests:  Recent Labs  11/14/15 12/26/15  AST 19 15  ALT 13 11  ALKPHOS 87 119   No results for input(s): LIPASE, AMYLASE in the last 8760 hours. No results for input(s): AMMONIA in the last 8760 hours. CBC:  Recent Labs  11/21/15 12/26/15 05/14/16  WBC 7.4 10.2 7.2  HGB 12.5 13.0 13.4  HCT 40 42 44  PLT 257 327 279     Assessment/Plan  Vitamin d deficiency Continue vitamin d supplement 2000 iu daily  gerd with esophagitis Stable, continue protonix 20 mg bid. Esophagitis noted in EGD 06/08/12  Hemiplegia following CVA Continue plavix, to wear her splint to left arm. Assistance with ADLs to be provided.     Oneal GroutMAHIMA Noe Pittsley, MD Internal Medicine [facility] [address] [place], [place] [id number] Cell Phone (Monday-Friday 8 am - 5 pm): [phone] On Call: [phone] and follow prompts after 5 pm and on weekends Office Phone: [phone] Office Fax: [phone]

## 2016-07-12 ENCOUNTER — Other Ambulatory Visit: Payer: Self-pay

## 2016-07-12 MED ORDER — FENTANYL 25 MCG/HR TD PT72: MEDICATED_PATCH | TRANSDERMAL | 0 refills | Status: DC

## 2016-07-25 ENCOUNTER — Non-Acute Institutional Stay (SKILLED_NURSING_FACILITY): Payer: Medicare Other | Admitting: Internal Medicine

## 2016-07-25 ENCOUNTER — Encounter: Payer: Self-pay | Admitting: Internal Medicine

## 2016-07-25 DIAGNOSIS — E890 Postprocedural hypothyroidism: Secondary | ICD-10-CM

## 2016-07-25 DIAGNOSIS — F329 Major depressive disorder, single episode, unspecified: Secondary | ICD-10-CM | POA: Diagnosis not present

## 2016-07-25 DIAGNOSIS — F32A Depression, unspecified: Secondary | ICD-10-CM

## 2016-07-25 DIAGNOSIS — D519 Vitamin B12 deficiency anemia, unspecified: Secondary | ICD-10-CM

## 2016-07-25 NOTE — Progress Notes (Signed)
LOCATION: Facility: [facility]    PCP: Oneal GroutPANDEY, Ollin Hochmuth, MD   Code Status: DNR  Goals of care: Advanced Directive information Advanced Directives 03/27/2016  Does patient have an advance directive? Yes  Type of Advance Directive Out of facility DNR (pink MOST or yellow form)  Does patient want to make changes to advanced directive? No - Patient declined  Copy of advanced directive(s) in chart? Yes       Extended Emergency Contact Information Primary Emergency Contact: Golda AcreGlosson,Patt  [place] Home Phone: [phone] Mobile Phone: [phone] Relation: Daughter Secondary Emergency Contact: Joyce,Lee Address: [address]          [place] [id number] [place] Home Phone: [phone] Mobile Phone: [phone] Relation: Daughter   Allergies  Allergen Reactions  . Prozac [Fluoxetine Hcl] Anaphylaxis, Hives, Itching, Swelling and Rash  . Cymbalta [Duloxetine Hcl]   . Sulfa Antibiotics   . Codeine Itching and Rash    Chief Complaint  Patient presents with  . Medical Management of Chronic Issues    Routine Visit     HPI:  82 y.o. patient is seen for routine visit. She has been at her baseline.   Review of Systems   Constitutional: Negative for fever  HENT: negative for headache, blurred vision, nasal discharge Respiratory: Negative for cough, shortness of breath and wheezing.    Cardiovascular: Negative for chest pain and palpitation.   Gastrointestinal: Negative for heartburn, nausea, vomiting, abdominal pain. Genitourinary: Negative for dysuria.   Musculoskeletal: Negative for fall   Neurological: Negative for dizziness Psychiatry: Negative for depression    Past Medical History:  Diagnosis Date  . Anxiety   . COPD (chronic obstructive pulmonary disease) (HCC)   . Depression   . Gastritis   . H/O arteritis    Temporal  . Headache(784.0)    temple artiritis  . Hypertension   . Pneumonia  05/2012  . Stroke [facility](HCC)    left sided weakness- transfers with walke rand assiast  . Thyroid nodule    Past Surgical History:  Procedure Laterality Date  . APPENDECTOMY    . CHOLECYSTECTOMY    . ESOPHAGOGASTRODUODENOSCOPY  06/08/2012   Procedure: ESOPHAGOGASTRODUODENOSCOPY (EGD);  Surgeon: Florencia Reasonsobert V Buccini, MD;  [facility];  Service: Endoscopy;  Laterality: N/A;  . HERNIA REPAIR    . KYPHOPLASTY  2012  . KYPHOPLASTY N/A 11/24/2012   Procedure: KYPHOPLASTY;  Surgeon: Barnett AbuHenry Elsner, MD;  Location: [facility] NEURO ORS;  Service: Neurosurgery;  Laterality: N/A;  Thoracic Five Thoracic Six and Thoracic Seven Acrylic Balloon Kyphoplasty  . OOPHORECTOMY    . THYROID SURGERY     No family history on file.  Medications:   Medication List       Accurate as of 07/25/16  2:47 PM. Always use your most recent med list.          acetaminophen 325 MG tablet Commonly known as:  TYLENOL Take 650 mg by mouth every 4 (four) hours as needed.   calcium-vitamin D 500-200 MG-UNIT tablet Commonly known as:  OSCAL WITH D Take 1 tablet by mouth 2 (two) times daily.   clopidogrel 75 MG tablet Commonly known as:  PLAVIX Take 75 mg by mouth daily.   Cranberry 200 MG Caps Take 400 mg by mouth 2 (two) times daily.   desvenlafaxine 50 MG 24 hr tablet Commonly known as:  PRISTIQ Take 50 mg by mouth daily.   fentaNYL 25 MCG/HR patch Commonly known as:  DURAGESIC - dosed mcg/hr Apply one patch every 72 hours for pain. Remove old patch. External Use only. Rotate sites. Check placement of patch every shift   ferrous sulfate 325 (65 FE) MG tablet Take 325 mg by mouth daily with breakfast.   Fluticasone-Salmeterol 250-50 MCG/DOSE Aepb Commonly known as:  ADVAIR Inhale 1 puff into the lungs every 12 (twelve) hours. Rinse mouth after use   folic acid 1 MG tablet Commonly known as:  FOLVITE Take 1 mg by mouth daily. For supplement   gabapentin 100 MG capsule Commonly known as:  NEURONTIN Take  100 mg by mouth 3 (three) times daily.   levothyroxine 25 MCG tablet Commonly known as:  SYNTHROID, LEVOTHROID Take 25 mcg by mouth daily before breakfast.   LORazepam 1 MG tablet Commonly known as:  ATIVAN Take 0.25 mg by mouth 2 (two) times daily as needed for anxiety.   magnesium oxide 400 MG tablet Commonly known as:  MAG-OX Take 400 mg by mouth daily.   Melatonin 5 MG Tabs Take 1 tablet by mouth at bedtime.   mirtazapine 30 MG tablet Commonly known as:  REMERON Take 30 mg by mouth at bedtime.   ondansetron 4 MG tablet Commonly known as:  ZOFRAN Take 4 mg by mouth every 6 (six) hours as needed for nausea or vomiting.   polyethylene glycol powder powder Commonly known as:  GLYCOLAX/MIRALAX Take 17 g by mouth daily.   polyvinyl alcohol 1.4 % ophthalmic solution Commonly known as:  LIQUIFILM TEARS Place 1 drop into both eyes 2 (two) times daily.   predniSONE 1 MG tablet Commonly known as:  DELTASONE Take 1 tablet (1 mg total) by mouth daily.   PROTONIX 20 MG tablet Generic drug:  pantoprazole Take 20 mg by mouth 2 (two) times daily.   senna 8.6 MG Tabs tablet Commonly known as:  SENOKOT Take 1 tablet by mouth at bedtime.   vitamin B-12 1000 MCG tablet Commonly known as:  CYANOCOBALAMIN Take 1,000 mcg by mouth daily.   Vitamin D3 2000 units Tabs Take by mouth. 1 by mouth daily for Vit D Deficiency       Immunizations: Immunization History  Administered Date(s) Administered  . Influenza-Unspecified 07/01/2013, 06/21/2014, 06/27/2015  . PPD Test 05/28/2013, 05/31/2014  . Pneumococcal-Unspecified 06/18/2011, 07/30/2013     Physical Exam:  Vitals:   07/25/16 1433  BP: 133/64  Pulse: 67  Resp: 18  Temp: 97.2 F (36.2 C)  TempSrc: Oral  Weight: 144 lb 8 oz (65.5 kg)  Height: 5\' 6"  (1.676 m)   Body mass index is 23.32 kg/m.  Wt Readings from Last 3 Encounters:  07/25/16 144 lb 8 oz (65.5 kg)  06/20/16 144 lb 1.6 oz (65.4 kg)  05/23/16 144 lb  1.6 oz (65.4 kg)    General- elderly female, frail, in no acute distress Head- normocephalic, atraumatic Skin- warm and dry Eyes- PERRLA, EOMI, has corrective glasses Mouth- moist mucus membrane Cardiovascular- normal s1,s2, no murmur Respiratory- bilateral clear to auscultation, no wheeze, no rhonchi, no crackles Abdomen- bowel sounds present, soft, non tender Musculoskeletal: left hemiplegia, normal ROM on right side. Assist with transfers. Left hand contracted and splint in place. Bilateral foot drop. Muscle wasting present Neurological: alert and oriented, can carry out conversation Psychiatric: normal mood and affect     Labs reviewed: Basic Metabolic Panel:  Recent Labs  16/06/9601/28/17 12/26/15 05/14/16  NA 142 141 145  K 4.5 4.9 4.2  BUN 16 16 13   CREATININE 0.9 0.8  0.9   Liver Function Tests:  Recent Labs  11/14/15 12/26/15  AST 19 15  ALT 13 11  ALKPHOS 87 119   No results for input(s): LIPASE, AMYLASE in the last 8760 hours. No results for input(s): AMMONIA in the last 8760 hours. CBC:  Recent Labs  11/21/15 12/26/15 05/14/16  WBC 7.4 10.2 7.2  HGB 12.5 13.0 13.4  HCT 40 42 44  PLT 257 327 279     Assessment/Plan  B-12 deficiency  continue vitamin B12 supplement   Chronic depression Continue Remeron 30 mg daily and lorazepam 0.25 mg twice a day as needed for anxiety  hypothyroidism Postsurgical. Continue levothyroxine current regimen, no changes   Oneal Grout, MD Internal Medicine [facility] [address] [place], [place] [address] Cell Phone (Monday-Friday 8 am - 5 pm): [phone] On Call: [phone] and follow prompts after 5 pm and on weekends Office Phone: [phone] Office Fax: [phone]

## 2016-07-29 LAB — CBC AND DIFFERENTIAL
HCT: 44 % (ref 36–46)
HEMOGLOBIN: 13.9 g/dL (ref 12.0–16.0)
Platelets: 296 10*3/uL (ref 150–399)
WBC: 7.2 10*3/mL

## 2016-08-14 ENCOUNTER — Other Ambulatory Visit: Payer: Self-pay

## 2016-08-14 MED ORDER — FENTANYL 25 MCG/HR TD PT72: MEDICATED_PATCH | TRANSDERMAL | 0 refills | Status: DC

## 2016-08-14 NOTE — Telephone Encounter (Signed)
Prescription request was received from:  [facility] [address] [place] [address]  Phone: [phone]  Fax: [phone]  

## 2016-08-16 ENCOUNTER — Ambulatory Visit: Payer: Medicare Other | Admitting: Podiatry

## 2016-09-23 ENCOUNTER — Encounter: Payer: Self-pay | Admitting: Internal Medicine

## 2016-09-23 ENCOUNTER — Non-Acute Institutional Stay (SKILLED_NURSING_FACILITY): Payer: Medicare Other | Admitting: Internal Medicine

## 2016-09-23 ENCOUNTER — Other Ambulatory Visit: Payer: Self-pay | Admitting: *Deleted

## 2016-09-23 DIAGNOSIS — J22 Unspecified acute lower respiratory infection: Secondary | ICD-10-CM | POA: Diagnosis not present

## 2016-09-23 DIAGNOSIS — K2100 Gastro-esophageal reflux disease with esophagitis, without bleeding: Secondary | ICD-10-CM

## 2016-09-23 DIAGNOSIS — E890 Postprocedural hypothyroidism: Secondary | ICD-10-CM

## 2016-09-23 DIAGNOSIS — J449 Chronic obstructive pulmonary disease, unspecified: Secondary | ICD-10-CM | POA: Diagnosis not present

## 2016-09-23 MED ORDER — FENTANYL 25 MCG/HR TD PT72: MEDICATED_PATCH | TRANSDERMAL | 0 refills | Status: AC

## 2016-09-23 NOTE — Telephone Encounter (Signed)
[name] [phone] Fax: [phone]  

## 2016-09-23 NOTE — Progress Notes (Signed)
LOCATION: Facility: [facility]    PCP: Oneal Grout, MD   Code Status: DNR  Goals of care: Advanced Directive information Advanced Directives 03/27/2016  Does Patient Have a Medical Advance Directive? Yes  Type of Advance Directive Out of facility DNR (pink MOST or yellow form)  Does patient want to make changes to medical advance directive? No - Patient declined  Copy of Healthcare Power of Attorney in Chart? Yes       Extended Emergency Contact Information Primary Emergency Contact: Golda [place] Home Phone: [phone] Mobile Phone: [phone] Relation: Daughter Secondary Emergency Contact: Joyce,Lee Address: [address]          [place] [address] [place] Home Phone: [phone] Mobile Phone: [phone] Relation: Daughter   Allergies  Allergen Reactions  . Prozac [Fluoxetine Hcl] Anaphylaxis, Hives, Itching, Swelling and Rash  . Cymbalta [Duloxetine Hcl]   . Sulfa Antibiotics   . Codeine Itching and Rash    Chief Complaint  Patient presents with  . Medical Management of Chronic Issues    Routine Visit      HPI:  83 y.o. patient is seen for routine visit. She was having cough with runny nose for 1 week. She had chest x-ray showing no infiltrates or acute abnormality. No temperature spike reported by nursing staff. There was concern for influenza by on call provider and she was prophylactically started on Tamiflu. No influenza nasal swab has been sent on review with nursing staff. She is seen in her room today. She feels clinically better today. She denies any fever or chills. She continues to have some cough with clear phlegm but denies any runny nose, sore throat, myalgias. Appetite has been good.    Review of Systems   Constitutional: Negative for fever  and chills. HENT: negative for headache, blurred vision, nasal discharge Respiratory: Negative for shortness of breath and  wheezing.    Cardiovascular: Negative for chest pain and palpitation.   Gastrointestinal: Negative for heartburn, nausea, vomiting, abdominal pain.         Genitourinary: Negative for dysuria.   Musculoskeletal: Negative for fall   Neurological: Negative for dizziness Psychiatry: Negative for depression    Past Medical History:  Diagnosis Date  . Anxiety   . COPD (chronic obstructive pulmonary disease) (HCC)   . Depression   . Gastritis   . H/O arteritis    Temporal  . Headache(784.0)    temple artiritis  . Hypertension   . Pneumonia 05/2012  . Stroke [facility])    left sided weakness- transfers with walke rand assiast  . Thyroid nodule    Past Surgical History:  Procedure Laterality Date  . APPENDECTOMY    . CHOLECYSTECTOMY    . ESOPHAGOGASTRODUODENOSCOPY  06/08/2012   Procedure: ESOPHAGOGASTRODUODENOSCOPY (EGD);  Surgeon: Florencia Reasons, MD;  [facility] ENDOSCOPY;  Service: Endoscopy;  Laterality: N/A;  . HERNIA REPAIR    . KYPHOPLASTY  2012  . KYPHOPLASTY N/A 11/24/2012   Procedure: KYPHOPLASTY;  Surgeon: Barnett Abu, MD;  Location: [facility] NEURO [facility]S;  Service: Neurosurgery;  Laterality: N/A;  Thoracic Five Thoracic Six and Thoracic Seven Acrylic Balloon Kyphoplasty  . OOPHORECTOMY    . THYROID SURGERY     No family history on file.  Medications: Allergies as of 09/23/2016      Reactions   Prozac [fluoxetine Hcl] Anaphylaxis, Hives, Itching, Swelling, Rash   Cymbalta [duloxetine Hcl]    Sulfa Antibiotics  Codeine Itching, Rash      Medication List       Accurate as of 09/23/16  2:32 PM. Always use your most recent med list.          acetaminophen 325 MG tablet Commonly known as:  TYLENOL Take 650 mg by mouth every 4 (four) hours as needed.   acetaminophen 325 MG tablet Commonly known as:  TYLENOL Take 650 mg by mouth every 6 (six) hours. Stop date 09/25/16   calcium-vitamin D 500-200 MG-UNIT tablet Commonly known as:  OSCAL WITH D Take 1 tablet by mouth 2  (two) times daily.   Cholecalciferol 1000 units tablet Take 1,000 Units by mouth daily.   clopidogrel 75 MG tablet Commonly known as:  PLAVIX Take 75 mg by mouth daily.   Cranberry 200 MG Caps Take 400 mg by mouth 2 (two) times daily.   desvenlafaxine 50 MG 24 hr tablet Commonly known as:  PRISTIQ Take 50 mg by mouth daily.   dextromethorphan-guaiFENesin 30-600 MG 12hr tablet Commonly known as:  MUCINEX DM Take 1 tablet by mouth 2 (two) times daily. Stop date 09/27/16   fentaNYL 25 MCG/HR patch Commonly known as:  DURAGESIC - dosed mcg/hr Apply one patch every 72 hours for pain. Remove old patch. External Use only. Rotate sites. Check placement of patch every shift   ferrous sulfate 325 (65 FE) MG tablet Take 325 mg by mouth daily with breakfast.   Fluticasone-Salmeterol 250-50 MCG/DOSE Aepb Commonly known as:  ADVAIR Inhale 1 puff into the lungs every 12 (twelve) hours. Rinse mouth after use   folic acid 1 MG tablet Commonly known as:  FOLVITE Take 1 mg by mouth daily. For supplement   gabapentin 100 MG capsule Commonly known as:  NEURONTIN Take 100 mg by mouth 3 (three) times daily.   levothyroxine 25 MCG tablet Commonly known as:  SYNTHROID, LEVOTHROID Take 25 mcg by mouth daily before breakfast.   magnesium oxide 400 MG tablet Commonly known as:  MAG-OX Take 400 mg by mouth daily.   Melatonin 5 MG Tabs Take 1 tablet by mouth at bedtime.   mirtazapine 30 MG tablet Commonly known as:  REMERON Take 30 mg by mouth at bedtime.   ondansetron 4 MG tablet Commonly known as:  ZOFRAN Take 4 mg by mouth every 6 (six) hours as needed for nausea or vomiting.   oseltamivir 75 MG capsule Commonly known as:  TAMIFLU Take 75 mg by mouth daily. Stop date 09/27/16   OXYGEN Inhale 2 L into the lungs continuous.   polyethylene glycol powder powder Commonly known as:  GLYCOLAX/MIRALAX Take 17 g by mouth daily.   polyvinyl alcohol 1.4 % ophthalmic solution Commonly  known as:  LIQUIFILM TEARS Place 1 drop into both eyes 2 (two) times daily.   predniSONE 1 MG tablet Commonly known as:  DELTASONE Take 1 tablet (1 mg total) by mouth daily.   PROTONIX 20 MG tablet Generic drug:  pantoprazole Take 20 mg by mouth 2 (two) times daily.   senna 8.6 MG Tabs tablet Commonly known as:  SENOKOT Take 1 tablet by mouth at bedtime.   UNABLE TO FIND Med Name: Med pass 60 mL by mouth 2 times daily. Stop date 09/27/16   vitamin B-12 1000 MCG tablet Commonly known as:  CYANOCOBALAMIN Take 1,000 mcg by mouth daily.       Immunizations: Immunization History  Administered Date(s) Administered  . Influenza-Unspecified 07/01/2013, 06/21/2014, 06/27/2015  . PPD Test 05/28/2013, 05/31/2014  .  Pneumococcal-Unspecified 06/18/2011, 07/30/2013     Physical Exam:  Vitals:   09/23/16 1421  BP: 131/65  Pulse: 82  Resp: 18  Temp: 98.7 F (37.1 C)  TempSrc: Oral  SpO2: 97%  Weight: 140 lb 3.2 oz (63.6 kg)  Height: 5\' 6"  (1.676 m)   Body mass index is 22.63 kg/m.  Wt Readings from Last 3 Encounters:  09/23/16 140 lb 3.2 oz (63.6 kg)  07/25/16 144 lb 8 oz (65.5 kg)  06/20/16 144 lb 1.6 oz (65.4 kg)    General- elderly female, frail, in no acute distress Head- normocephalic, atraumatic Skin- warm and dry Eyes- PERRLA, EOMI, has corrective glasses Mouth- moist mucus membrane Cardiovascular- normal s1,s2, no murmur Respiratory- Decreased air movement bilaterally, wheeze present, no rhonchi or crackles, no use of accessory muscles Abdomen- bowel sounds present, soft, non tender Musculoskeletal: left hemiplegia, normal ROM on right side. Assist with transfers. Left hand contracted and splint in place. Bilateral foot drop. Muscle wasting present Neurological: alert and oriented, can carry out conversation Psychiatric: normal mood and affect     Labs reviewed: Basic Metabolic Panel:  Recent Labs  16/06/9601/28/17 12/26/15 05/14/16  NA 142 141 145  K 4.5  4.9 4.2  BUN 16 16 13   CREATININE 0.9 0.8 0.9   Liver Function Tests:  Recent Labs  11/14/15 12/26/15  AST 19 15  ALT 13 11  ALKPHOS 87 119   No results for input(s): LIPASE, AMYLASE in the last 8760 hours. No results for input(s): AMMONIA in the last 8760 hours. CBC:  Recent Labs  12/26/15 05/14/16 07/29/16  WBC 10.2 7.2 7.2  HGB 13.0 13.4 13.9  HCT 42 44 44  PLT 327 279 296     Assessment/Plan  Acute lower respiratory tract infection She has cough and chest congestion, wheezing on exam but has no rhonchi, is afebrile, denies myalgia and has good appetite.This does not appear to be influenza on clinical exam. Patient is currently on Tamiflu. Will discontinue this. She likely has a viral lower respiratory tract infection. Patient remains afebrile. To provide conservative management. Start DuoNeb 3 times a day for 5 days. Keep her well hydrated. continue Mucinex. Monitor her oral intake. monitor for signs of early COPD exacerbation.    COPD Denies any change in color of her phlegm. Has some wheezing on exam. Monitor for signs of early COPD exacerbation. Continue Advair for now and anterior neck as above and monitor.  Hypothyroidism Lab Results  Component Value Date   TSH 2.35 12/26/2015    Postsurgical. Continue levothyroxine current regimen, check tsh  Gastroesophageal reflux disease Symptoms currently controlled on Protonix 20 mg twice a day. No changes made. No bleed reported.    Oneal GroutMAHIMA Ramond Darnell, MD Internal Medicine [facility] [address] [place], [place] [address] Cell Phone (Monday-Friday 8 am - 5 pm): [phone] On Call: [phone] and follow prompts after 5 pm and on weekends Office Phone: [phone] Office Fax: [phone]

## 2016-09-30 LAB — TSH: TSH: 1.7 u[IU]/mL (ref 0.41–5.90)

## 2016-10-25 LAB — BASIC METABOLIC PANEL
BUN: 18 mg/dL (ref 4–21)
CREATININE: 0.8 mg/dL (ref 0.5–1.1)
Glucose: 102 mg/dL
POTASSIUM: 4 mmol/L (ref 3.4–5.3)
Sodium: 143 mmol/L (ref 137–147)

## 2016-10-25 LAB — HEPATIC FUNCTION PANEL
ALT: 16 U/L (ref 7–35)
AST: 20 U/L (ref 13–35)
Alkaline Phosphatase: 100 U/L (ref 25–125)
Bilirubin, Total: 0.3 mg/dL

## 2016-10-25 LAB — CBC AND DIFFERENTIAL
HCT: 37 % (ref 36–46)
HEMOGLOBIN: 11.6 g/dL — AB (ref 12.0–16.0)
PLATELETS: 308 10*3/uL (ref 150–399)
WBC: 7.9 10*3/mL

## 2016-10-25 LAB — LIPID PANEL
CHOLESTEROL: 223 mg/dL — AB (ref 0–200)
HDL: 59 mg/dL (ref 35–70)
LDL Cholesterol: 128 mg/dL
TRIGLYCERIDES: 181 mg/dL — AB (ref 40–160)

## 2016-10-25 LAB — HEMOGLOBIN A1C: Hemoglobin A1C: 6.1

## 2016-12-19 ENCOUNTER — Non-Acute Institutional Stay (SKILLED_NURSING_FACILITY): Payer: Medicare Other | Admitting: Internal Medicine

## 2016-12-19 DIAGNOSIS — E785 Hyperlipidemia, unspecified: Secondary | ICD-10-CM | POA: Diagnosis not present

## 2016-12-19 DIAGNOSIS — E440 Moderate protein-calorie malnutrition: Secondary | ICD-10-CM | POA: Diagnosis not present

## 2016-12-19 DIAGNOSIS — K2210 Ulcer of esophagus without bleeding: Secondary | ICD-10-CM | POA: Diagnosis not present

## 2016-12-19 DIAGNOSIS — F32A Depression, unspecified: Secondary | ICD-10-CM

## 2016-12-19 DIAGNOSIS — F329 Major depressive disorder, single episode, unspecified: Secondary | ICD-10-CM | POA: Diagnosis not present

## 2016-12-19 DIAGNOSIS — D500 Iron deficiency anemia secondary to blood loss (chronic): Secondary | ICD-10-CM

## 2016-12-19 DIAGNOSIS — I69354 Hemiplegia and hemiparesis following cerebral infarction affecting left non-dominant side: Secondary | ICD-10-CM

## 2016-12-19 NOTE — Progress Notes (Signed)
LOCATION: Facility: [facility]    PCP: Oneal Grout, MD   Code Status: DNR  Goals of care: Advanced Directive information Advanced Directives 03/27/2016  Does Patient Have a Medical Advance Directive? Yes  Type of Advance Directive Out of facility DNR (pink MOST or yellow form)  Does patient want to make changes to medical advance directive? No - Patient declined  Copy of Healthcare Power of Attorney in Chart? Yes       Extended Emergency Contact Information Primary Emergency Contact: Golda [place] Home Phone: [phone] Mobile Phone: [phone] Relation: Daughter Secondary Emergency Contact: Joyce,Lee Address: [address]          [place] [address] [place] Home Phone: [phone] Mobile Phone: [phone] Relation: Daughter   Allergies  Allergen Reactions  . Prozac [Fluoxetine Hcl] Anaphylaxis, Hives, Itching, Swelling and Rash  . Cymbalta [Duloxetine Hcl]   . Sulfa Antibiotics   . Codeine Itching and Rash    Chief Complaint  Patient presents with  . Medical Management of Chronic Issues    Routine Visit      HPI:  83 y.o. patient is seen for routine visit. She has been at her baseline with no new concerns. On lab review, elevated LDL.   Review of Systems   Constitutional: Negative for fever and chills. HENT: negative for headache, nasal discharge Respiratory: Negative for shortness of breath and wheezing.    Cardiovascular: Negative for chest pain and palpitation.   Gastrointestinal: Negative for heartburn, nausea, vomiting, abdominal pain. She had a bowel movement this am.         Genitourinary: Negative for dysuria.   Musculoskeletal: Negative for fall   Neurological: Negative for dizziness Psychiatry: Negative for depression    Past Medical History:  Diagnosis Date  . Anxiety   . COPD (chronic obstructive pulmonary disease) (HCC)   . Depression   . Gastritis   .  H/O arteritis    Temporal  . Headache(784.0)    temple artiritis  . Hypertension   . Pneumonia 05/2012  . Stroke [facility])    left sided weakness- transfers with walke rand assiast  . Thyroid nodule    Past Surgical History:  Procedure Laterality Date  . APPENDECTOMY    . CHOLECYSTECTOMY    . ESOPHAGOGASTRODUODENOSCOPY  06/08/2012   Procedure: ESOPHAGOGASTRODUODENOSCOPY (EGD);  Surgeon: Florencia Reasons, MD;  Location: [facility] ENDOSCOPY;  Service: Endoscopy;  Laterality: N/A;  . HERNIA REPAIR    . KYPHOPLASTY  2012  . KYPHOPLASTY N/A 11/24/2012   Procedure: KYPHOPLASTY;  Surgeon: Barnett Abu, MD;  Location: [facility] NEURO ORS;  Service: Neurosurgery;  Laterality: N/A;  Thoracic Five Thoracic Six and Thoracic Seven Acrylic Balloon Kyphoplasty  . OOPHORECTOMY    . THYROID SURGERY     No family history on file.  Medications: Allergies as of 12/19/2016      Reactions   Prozac [fluoxetine Hcl] Anaphylaxis, Hives, Itching, Swelling, Rash   Cymbalta [duloxetine Hcl]    Sulfa Antibiotics    Codeine Itching, Rash      Medication List       Accurate as of 12/19/16  3:53 PM. Always use your most recent med list.          acetaminophen 325 MG tablet Commonly known as:  TYLENOL Take 650 mg by mouth every 4 (four) hours as needed.   calcium-vitamin D 500-200 MG-UNIT tablet Commonly known as:  OSCAL WITH D Take 1 tablet  by mouth 2 (two) times daily.   Cholecalciferol 1000 units tablet Take 1,000 Units by mouth daily.   clopidogrel 75 MG tablet Commonly known as:  PLAVIX Take 75 mg by mouth daily.   Cranberry 200 MG Caps Take 400 mg by mouth 2 (two) times daily.   desvenlafaxine 50 MG 24 hr tablet Commonly known as:  PRISTIQ Take 50 mg by mouth daily.   feeding supplement (PRO-STAT SUGAR FREE 64) Liqd Take 30 mLs by mouth daily. Stop date 01/02/17   fentaNYL 25 MCG/HR patch Commonly known as:  DURAGESIC - dosed mcg/hr Apply one patch every 72 hours for pain. Remove old patch. External  Use only. Rotate sites. Check placement of patch every shift   ferrous sulfate 325 (65 FE) MG tablet Take 325 mg by mouth daily with breakfast.   Fluticasone-Salmeterol 250-50 MCG/DOSE Aepb Commonly known as:  ADVAIR Inhale 1 puff into the lungs every 12 (twelve) hours. Rinse mouth after use   folic acid 1 MG tablet Commonly known as:  FOLVITE Take 1 mg by mouth daily. For supplement   gabapentin 100 MG capsule Commonly known as:  NEURONTIN Take 100 mg by mouth 3 (three) times daily.   levothyroxine 25 MCG tablet Commonly known as:  SYNTHROID, LEVOTHROID Take 25 mcg by mouth daily before breakfast.   magnesium oxide 400 MG tablet Commonly known as:  MAG-OX Take 400 mg by mouth daily.   Melatonin 5 MG Tabs Take 1 tablet by mouth at bedtime.   mirtazapine 45 MG tablet Commonly known as:  REMERON Take 22.5 mg by mouth at bedtime.   ondansetron 4 MG tablet Commonly known as:  ZOFRAN Take 4 mg by mouth every 6 (six) hours as needed for nausea or vomiting.   OXYGEN Inhale 2 L into the lungs continuous.   polyethylene glycol powder powder Commonly known as:  GLYCOLAX/MIRALAX Take 17 g by mouth daily.   polyvinyl alcohol 1.4 % ophthalmic solution Commonly known as:  LIQUIFILM TEARS Place 1 drop into both eyes 2 (two) times daily.   predniSONE 1 MG tablet Commonly known as:  DELTASONE Take 1 tablet (1 mg total) by mouth daily.   PROTONIX 20 MG tablet Generic drug:  pantoprazole Take 20 mg by mouth 2 (two) times daily.   senna 8.6 MG Tabs tablet Commonly known as:  SENOKOT Take 1 tablet by mouth at bedtime.   simvastatin 20 MG tablet Commonly known as:  ZOCOR Take 20 mg by mouth at bedtime.   UNABLE TO FIND Med Name: Med pass 90 mL by mouth 2 times daily.   vitamin B-12 1000 MCG tablet Commonly known as:  CYANOCOBALAMIN Take 1,000 mcg by mouth daily.       Immunizations: Immunization History  Administered Date(s) Administered  . Influenza-Unspecified  07/01/2013, 06/21/2014, 06/27/2015, 10/10/2016  . PPD Test 05/28/2013, 05/31/2014  . Pneumococcal-Unspecified 06/18/2011, 07/30/2013     Physical Exam:  Vitals:   12/19/16 1533  BP: 129/67  Pulse: 90  Resp: 20  Temp: (!) 96.3 F (35.7 C)  TempSrc: Oral  SpO2: 97%  Weight: 132 lb 3.2 oz (60 kg)  Height:  (1.676 m)   Body mass index is 21.34 kg/m.  Wt Readings from Last 3 Encounters:  12/19/16 132 lb 3.2 oz (60 kg)  09/23/16 140 lb 3.2 oz (63.6 kg)  07/25/16 144 lb 8 oz (65.5 kg)    General- elderly female, frail, thin built, in no acute distress Head- normocephalic, atraumatic Skin- warm and  dry Eyes- PERRLA, EOMI, has corrective glasses Mouth- moist mucus membrane Cardiovascular- normal s1,s2, no murmur Respiratory- CTAB  Abdomen- bowel sounds present, soft, non tender Musculoskeletal: left hemiplegia, normal ROM on right side. Assist with transfers. Left hand contracted and splint in place. Bilateral foot drop. Muscle wasting present Neurological: alert and oriented to person and place but not to time Psychiatric: normal mood and affect     Labs reviewed: Basic Metabolic Panel:  Recent Labs  95/28/41 05/14/16 10/25/16  NA 141 145 143  K 4.9 4.2 4.0  BUN CREATININE 0.8 0.9 0.8   Liver Function Tests:  Recent Labs  12/26/15 10/25/16  AST 15 20  ALT 11 16  ALKPHOS 119 100   No results for input(s): LIPASE, AMYLASE in the last 8760 hours. No results for input(s): AMMONIA in the last 8760 hours. CBC:  Recent Labs  05/14/16 07/29/16 10/25/16  WBC 7.2 7.2 7.9  HGB 13.4 13.9 11.6*  HCT 44 44 37  PLT 279 296 308     Assessment/Plan  Left hemiplegia Post CVA, continue plavix daily and simvastatin  Chronic depression Stable mood. Continue remeron 22.5 mg qhs and desvenlafaxine  Hyperlipidemia Lipid Panel     Component Value Date/Time   CHOL 223 (A) 10/25/2016   TRIG 181 (A) 10/25/2016   HDL 59 10/25/2016   CHOLHDL 2.2  08/25/2008 0650   VLDL 6 08/25/2008 0650   LDLCALC 128 10/25/2016  LDL not at goal with her hx of cva. Started on simvastatin 20 mg daily, monitor lipid panel  Protein calorie malnutrition Continues to lose weight. On remeron. medpass and prostat has been added. Monitor po intake and weight  Iron def anemia Has hx of gerd with erosive esophagitis. No bleed reported. Continue iron supplement. Reviewed cbc  Erosive esophagitis Continue protonix 20 mg bid with her on chronic prednisone and her history of esophagitis  I have spent greater than 35 minutes for this encounter which includes reviewing medications and lab work, addressing above mentioned concerns, reviewing care plan with patient, answering patient's concerns.     Oneal Grout, MD Internal Medicine [facility] [address] [place], [place] [address] Cell Phone (Monday-Friday 8 am - 5 pm): [phone] On Call: [phone] and follow prompts after 5 pm and on weekends Office Phone: [phone] Office Fax: [phone]

## 2017-01-30 ENCOUNTER — Encounter: Payer: Self-pay | Admitting: Internal Medicine

## 2017-01-30 ENCOUNTER — Non-Acute Institutional Stay (SKILLED_NURSING_FACILITY): Payer: Medicare Other

## 2017-01-30 DIAGNOSIS — Z0000 Encounter for general adult medical examination without abnormal findings: Secondary | ICD-10-CM | POA: Diagnosis not present

## 2017-01-30 NOTE — Progress Notes (Signed)
Information deleted & entered in error

## 2017-02-13 NOTE — Progress Notes (Signed)
Subjective:   Erica French is a 83 y.o. female who presents for Medicare Annual (Subsequent) preventive examination at [facility]     Objective:     Vitals: BP 100/60 (BP Location: Right Arm, Patient Position: Supine)   Pulse 92   Temp 98.2 F (36.8 C) (Oral)   Ht 5\' 6"  (1.676 m)   Wt 132 lb (59.9 kg)   SpO2 97%   BMI 21.31 kg/m   Body mass index is 21.31 kg/m.   Tobacco History  Smoking Status  . Former Smoker  . Packs/day: 0.50  . Years: 20.00  . Quit date: 09/07/2008  Smokeless Tobacco  . Never Used     Counseling given: Not Answered   Past Medical History:  Diagnosis Date  . Anxiety   . COPD (chronic obstructive pulmonary disease) (HCC)   . Depression   . Gastritis   . H/O arteritis    Temporal  . Headache(784.0)    temple artiritis  . Hypertension   . Pneumonia 05/2012  . Stroke [facility](HCC)    left sided weakness- transfers with walke rand assiast  . Thyroid nodule    Past Surgical History:  Procedure Laterality Date  . APPENDECTOMY    . CHOLECYSTECTOMY    . ESOPHAGOGASTRODUODENOSCOPY  06/08/2012   Procedure: ESOPHAGOGASTRODUODENOSCOPY (EGD);  Surgeon: Florencia Reasonsobert V Buccini, MD;  [facility];  Service: Endoscopy;  Laterality: N/A;  . HERNIA REPAIR    . KYPHOPLASTY  2012  . KYPHOPLASTY N/A 11/24/2012   Procedure: KYPHOPLASTY;  Surgeon: Barnett AbuHenry Elsner, MD;  Location: [facility] NEURO ORS;  Service: Neurosurgery;  Laterality: N/A;  Thoracic Five Thoracic Six and Thoracic Seven Acrylic Balloon Kyphoplasty  . OOPHORECTOMY    . THYROID SURGERY     History reviewed. No pertinent family history. History  Sexual Activity  . Sexual activity: Not Currently    Outpatient Encounter Prescriptions as of 01/30/2017  Medication Sig  . acetaminophen (TYLENOL) 325 MG tablet Take 650 mg by mouth every 4 (four) hours as needed.  . Amino Acids-Protein Hydrolys (FEEDING SUPPLEMENT, PRO-STAT SUGAR FREE 64,) LIQD Take 30 mLs by mouth daily. Stop date  01/02/17  . calcium-vitamin D (OSCAL WITH D) 500-200 MG-UNIT per tablet Take 1 tablet by mouth 2 (two) times daily.  . Cholecalciferol 1000 units tablet Take 1,000 Units by mouth daily.  . clopidogrel (PLAVIX) 75 MG tablet Take 75 mg by mouth daily.  . Cranberry 200 MG CAPS Take 400 mg by mouth 2 (two) times daily.  Marland Kitchen. desvenlafaxine (PRISTIQ) 50 MG 24 hr tablet Take 50 mg by mouth daily.  . fentaNYL (DURAGESIC - DOSED MCG/HR) 25 MCG/HR patch Apply one patch every 72 hours for pain. Remove old patch. External Use only. Rotate sites. Check placement of patch every shift  . ferrous sulfate 325 (65 FE) MG tablet Take 325 mg by mouth daily with breakfast.  . Fluticasone-Salmeterol (ADVAIR) 250-50 MCG/DOSE AEPB Inhale 1 puff into the lungs every 12 (twelve) hours. Rinse mouth after use  . folic acid (FOLVITE) 1 MG tablet Take 1 mg by mouth daily. For supplement  . gabapentin (NEURONTIN) 100 MG capsule Take 100 mg by mouth 3 (three) times daily.  Marland Kitchen. levothyroxine (SYNTHROID, LEVOTHROID) 25 MCG tablet Take 25 mcg by mouth daily before breakfast.  . magnesium oxide (MAG-OX) 400 MG tablet Take 400 mg by mouth daily.  . Melatonin 5 MG TABS Take 1 tablet by mouth at bedtime.  . mirtazapine (REMERON) 45 MG tablet  Take 22.5 mg by mouth at bedtime.  . ondansetron (ZOFRAN) 4 MG tablet Take 4 mg by mouth every 6 (six) hours as needed for nausea or vomiting.  . OXYGEN Inhale 2 L into the lungs continuous.  . pantoprazole (PROTONIX) 20 MG tablet Take 20 mg by mouth 2 (two) times daily.   . polyethylene glycol powder (GLYCOLAX/MIRALAX) powder Take 17 g by mouth daily.  . polyvinyl alcohol (LIQUIFILM TEARS) 1.4 % ophthalmic solution Place 1 drop into both eyes 2 (two) times daily.   . predniSONE (DELTASONE) 1 MG tablet Take 1 tablet (1 mg total) by mouth daily.  Marland Kitchen senna (SENOKOT) 8.6 MG TABS tablet Take 1 tablet by mouth at bedtime.  . simvastatin (ZOCOR) 20 MG tablet Take 20 mg by mouth at bedtime.  Marland Kitchen UNABLE TO  FIND Med Name: Med pass 90 mL by mouth 2 times daily.  . vitamin B-12 (CYANOCOBALAMIN) 1000 MCG tablet Take 1,000 mcg by mouth daily.   No facility-administered encounter medications on file as of 01/30/2017.     Activities of Daily Living In your present state of health, do you have any difficulty performing the following activities: 01/30/2017  Hearing? N  Vision? N  Difficulty concentrating or making decisions? N  Walking or climbing stairs? Y  Dressing or bathing? Y  Doing errands, shopping? Y  Preparing Food and eating ? Y  Using the Toilet? Y  In the past six months, have you accidently leaked urine? N  Do you have problems with loss of bowel control? N  Managing your Medications? Y  Managing your Finances? Y  Housekeeping or managing your Housekeeping? Y  Some recent data might be hidden    Patient Care Team: Oneal Grout, MD as PCP - General (Internal Medicine)    Assessment:     Exercise Activities and Dietary recommendations Current Exercise Habits: The patient does not participate in regular exercise at present  Goals    . Maintain Lifestyle          Starting today pt will maintain lifestyle.       Fall Risk Fall Risk  01/30/2017 01/30/2017  Falls in the past year? No No   Depression Screen PHQ 2/9 Scores 01/30/2017 01/30/2017  PHQ - 2 Score 2 2  PHQ- 9 Score 5 5     Cognitive Function     6CIT Screen 01/30/2017  What Year? 4 points  What month? 0 points  What time? 0 points  Count back from 20 0 points  Months in reverse 4 points  Repeat phrase 10 points  Total Score 18    Immunization History  Administered Date(s) Administered  . Influenza-Unspecified 07/01/2013, 06/21/2014, 06/27/2015, 10/10/2016  . PPD Test 05/28/2013, 05/31/2014  . Pneumococcal-Unspecified 06/18/2011, 07/30/2013   Screening Tests Health Maintenance  Topic Date Due  . DEXA SCAN  09/16/2018 (Originally 03/31/1997)  . TETANUS/TDAP  09/16/2018 (Originally 04/01/1949)  .  PNA vac Low Risk Adult (2 of 2 - PCV13) 02/19/2029 (Originally 07/30/2014)  . INFLUENZA VACCINE  04/16/2017      Plan:    I have personally reviewed and addressed the Medicare Annual Wellness questionnaire and have noted the following in the patient's chart:  A. Medical and social history B. Use of alcohol, tobacco or illicit drugs  C. Current medications and supplements D. Functional ability and status E.  Nutritional status F.  Physical activity G. Advance directives H. List of other physicians I.  Hospitalizations, surgeries, and ER visits in previous 12  months J.  Vitals K. Screenings to include hearing, vision, cognitive, depression L. Referrals and appointments - none  In addition, I have reviewed and discussed with patient certain preventive protocols, quality metrics, and best practice recommendations. A written personalized care plan for preventive services as well as general preventive health recommendations were provided to patient.  See attached scanned questionnaire for additional information.   Signed,   Annetta Maw, RN Nurse Health Advisor  Quick Notes   Health Maintenance: Dexa due, let me know if I should order it for her.     Abnormal Screen: 6 CIT-18     Patient Concerns: None     Nurse Concerns: None    I agree with above and was available to answer medical question/ concerns from Nurse Health Advisor.

## 2017-02-13 NOTE — Patient Instructions (Signed)
Erica French , Thank you for taking time to come for your Medicare Wellness Visit. I appreciate your ongoing commitment to your health goals. Please review the following plan we discussed and let me know if I can assist you in the future.   Screening recommendations/referrals: Colonoscopy not due, over age 81 Mammogram not due, over age 81 Bone Density due Recommended yearly ophthalmology/optometry visit for glaucoma screening and checkup Recommended yearly dental visit for hygiene and checkup  Vaccinations: Influenza vaccine up to date. Due 10/10/17 Pneumococcal vaccine up to date Tdap vaccine not in records Shingles vaccine not in records  Advanced directives: Need copy for chart  Conditions/risks identified: none  Next appointment: none upcoming   Preventive Care 65 Years and Older, Female Preventive care refers to lifestyle choices and visits with your health care provider that can promote health and wellness. What does preventive care include?  A yearly physical exam. This is also called an annual well check.  Dental exams once or twice a year.  Routine eye exams. Ask your health care provider how often you should have your eyes checked.  Personal lifestyle choices, including:  Daily care of your teeth and gums.  Regular physical activity.  Eating a healthy diet.  Avoiding tobacco and drug use.  Limiting alcohol use.  Practicing safe sex.  Taking low-dose aspirin every day.  Taking vitamin and mineral supplements as recommended by your health care provider. What happens during an annual well check? The services and screenings done by your health care provider during your annual well check will depend on your age, overall health, lifestyle risk factors, and family history of disease. Counseling  Your health care provider may ask you questions about your:  Alcohol use.  Tobacco use.  Drug use.  Emotional well-being.  Home and relationship  well-being.  Sexual activity.  Eating habits.  History of falls.  Memory and ability to understand (cognition).  Work and work Astronomerenvironment.  Reproductive health. Screening  You may have the following tests or measurements:  Height, weight, and BMI.  Blood pressure.  Lipid and cholesterol levels. These may be checked every 5 years, or more frequently if you are over 81 years old.  Skin check.  Lung cancer screening. You may have this screening every year starting at age 455 if you have a 30-pack-year history of smoking and currently smoke or have quit within the past 15 years.  Fecal occult blood test (FOBT) of the stool. You may have this test every year starting at age 81.  Flexible sigmoidoscopy or colonoscopy. You may have a sigmoidoscopy every 5 years or a colonoscopy every 10 years starting at age 81.  Hepatitis C blood test.  Hepatitis B blood test.  Sexually transmitted disease (STD) testing.  Diabetes screening. This is done by checking your blood sugar (glucose) after you have not eaten for a while (fasting). You may have this done every 1-3 years.  Bone density scan. This is done to screen for osteoporosis. You may have this done starting at age 81.  Mammogram. This may be done every 1-2 years. Talk to your health care provider about how often you should have regular mammograms. Talk with your health care provider about your test results, treatment options, and if necessary, the need for more tests. Vaccines  Your health care provider may recommend certain vaccines, such as:  Influenza vaccine. This is recommended every year.  Tetanus, diphtheria, and acellular pertussis (Tdap, Td) vaccine. You may need a Td booster  every 10 years.  Zoster vaccine. You may need this after age 22.  Pneumococcal 13-valent conjugate (PCV13) vaccine. One dose is recommended after age 14.  Pneumococcal polysaccharide (PPSV23) vaccine. One dose is recommended after age  75. Talk to your health care provider about which screenings and vaccines you need and how often you need them. This information is not intended to replace advice given to you by your health care provider. Make sure you discuss any questions you have with your health care provider. Document Released: 09/29/2015 Document Revised: 05/22/2016 Document Reviewed: 07/04/2015 Elsevier Interactive Patient Education  2017 [place] Prevention in the Home Falls can cause injuries. They can happen to people of all ages. There are many things you can do to make your home safe and to help prevent falls. What can I do on the outside of my home?  Regularly fix the edges of walkways and driveways and fix any cracks.  Remove anything that might make you trip as you walk through a door, such as a raised step or threshold.  Trim any bushes or trees on the path to your home.  Use bright outdoor lighting.  Clear any walking paths of anything that might make someone trip, such as rocks or tools.  Regularly check to see if handrails are loose or broken. Make sure that both sides of any steps have handrails.  Any raised decks and porches should have guardrails on the edges.  Have any leaves, snow, or ice cleared regularly.  Use sand or salt on walking paths during winter.  Clean up any spills in your garage right away. This includes oil or grease spills. What can I do in the bathroom?  Use night lights.  Install grab bars by the toilet and in the tub and shower. Do not use towel bars as grab bars.  Use non-skid mats or decals in the tub or shower.  If you need to sit down in the shower, use a plastic, non-slip stool.  Keep the floor dry. Clean up any water that spills on the floor as soon as it happens.  Remove soap buildup in the tub or shower regularly.  Attach bath mats securely with double-sided non-slip rug tape.  Do not have throw rugs and other things on the floor that can make  you trip. What can I do in the bedroom?  Use night lights.  Make sure that you have a light by your bed that is easy to reach.  Do not use any sheets or blankets that are too big for your bed. They should not hang down onto the floor.  Have a firm chair that has side arms. You can use this for support while you get dressed.  Do not have throw rugs and other things on the floor that can make you trip. What can I do in the kitchen?  Clean up any spills right away.  Avoid walking on wet floors.  Keep items that you use a lot in easy-to-reach places.  If you need to reach something above you, use a strong step stool that has a grab bar.  Keep electrical cords out of the way.  Do not use floor polish or wax that makes floors slippery. If you must use wax, use non-skid floor wax.  Do not have throw rugs and other things on the floor that can make you trip. What can I do with my stairs?  Do not leave any items on the stairs.  Make  sure that there are handrails on both sides of the stairs and use them. Fix handrails that are broken or loose. Make sure that handrails are as long as the stairways.  Check any carpeting to make sure that it is firmly attached to the stairs. Fix any carpet that is loose or worn.  Avoid having throw rugs at the top or bottom of the stairs. If you do have throw rugs, attach them to the floor with carpet tape.  Make sure that you have a light switch at the top of the stairs and the bottom of the stairs. If you do not have them, ask someone to add them for you. What else can I do to help prevent falls?  Wear shoes that:  Do not have high heels.  Have rubber bottoms.  Are comfortable and fit you well.  Are closed at the toe. Do not wear sandals.  If you use a stepladder:  Make sure that it is fully opened. Do not climb a closed stepladder.  Make sure that both sides of the stepladder are locked into place.  Ask someone to hold it for you, if  possible.  Clearly mark and make sure that you can see:  Any grab bars or handrails.  First and last steps.  Where the edge of each step is.  Use tools that help you move around (mobility aids) if they are needed. These include:  Canes.  Walkers.  Scooters.  Crutches.  Turn on the lights when you go into a dark area. Replace any light bulbs as soon as they burn out.  Set up your furniture so you have a clear path. Avoid moving your furniture around.  If any of your floors are uneven, fix them.  If there are any pets around you, be aware of where they are.  Review your medicines with your doctor. Some medicines can make you feel dizzy. This can increase your chance of falling. Ask your doctor what other things that you can do to help prevent falls. This information is not intended to replace advice given to you by your health care provider. Make sure you discuss any questions you have with your health care provider. Document Released: 06/29/2009 Document Revised: 02/08/2016 Document Reviewed: 10/07/2014 Elsevier Interactive Patient Education  2017 [employer].

## 2017-02-13 NOTE — Addendum Note (Signed)
Addended by: Annetta MawGONTHIER, Orlena Garmon E on: 02/13/2017 11:55 AM   Modules accepted: Level of Service

## 2018-04-29 ENCOUNTER — Encounter: Payer: Self-pay | Admitting: Internal Medicine

## 2020-12-23 ENCOUNTER — Other Ambulatory Visit: Payer: Self-pay

## 2020-12-23 ENCOUNTER — Encounter (HOSPITAL_COMMUNITY): Payer: Self-pay | Admitting: Emergency Medicine

## 2020-12-23 ENCOUNTER — Emergency Department (HOSPITAL_COMMUNITY)
Admission: EM | Admit: 2020-12-23 | Discharge: 2020-12-24 | Disposition: A | Discharge status: Home / Self Care | Payer: Medicare Other | Attending: Emergency Medicine | Admitting: Emergency Medicine

## 2020-12-23 DIAGNOSIS — N183 Chronic kidney disease, stage 3 unspecified: Secondary | ICD-10-CM | POA: Diagnosis not present

## 2020-12-23 DIAGNOSIS — G8192 Hemiplegia, unspecified affecting left dominant side: Secondary | ICD-10-CM | POA: Diagnosis not present

## 2020-12-23 DIAGNOSIS — R112 Nausea with vomiting, unspecified: Secondary | ICD-10-CM | POA: Diagnosis present

## 2020-12-23 DIAGNOSIS — E039 Hypothyroidism, unspecified: Secondary | ICD-10-CM | POA: Insufficient documentation

## 2020-12-23 DIAGNOSIS — J449 Chronic obstructive pulmonary disease, unspecified: Secondary | ICD-10-CM | POA: Insufficient documentation

## 2020-12-23 DIAGNOSIS — Z7902 Long term (current) use of antithrombotics/antiplatelets: Secondary | ICD-10-CM | POA: Diagnosis not present

## 2020-12-23 DIAGNOSIS — R109 Unspecified abdominal pain: Secondary | ICD-10-CM | POA: Insufficient documentation

## 2020-12-23 DIAGNOSIS — I129 Hypertensive chronic kidney disease with stage 1 through stage 4 chronic kidney disease, or unspecified chronic kidney disease: Secondary | ICD-10-CM | POA: Insufficient documentation

## 2020-12-23 DIAGNOSIS — Z87891 Personal history of nicotine dependence: Secondary | ICD-10-CM | POA: Diagnosis not present

## 2020-12-23 DIAGNOSIS — Z79899 Other long term (current) drug therapy: Secondary | ICD-10-CM | POA: Insufficient documentation

## 2020-12-23 MED ORDER — SODIUM CHLORIDE 0.9 % IV BOLUS
1000.0000 mL | Freq: Once | INTRAVENOUS | Status: AC
  Administered 2020-12-23: 1000 mL via INTRAVENOUS

## 2020-12-23 MED ORDER — METOCLOPRAMIDE HCL 5 MG/ML IJ SOLN
10.0000 mg | INTRAMUSCULAR | Status: AC
  Administered 2020-12-23: 10 mg via INTRAVENOUS
  Filled 2020-12-23: qty 2

## 2020-12-23 NOTE — ED Provider Notes (Signed)
[facility]-EMERGENCY DEPT Provider Note   CSN: [id number] Arrival date & time: 12/23/20  2302     History Chief Complaint  Patient presents with  . Nausea    Erica French is a 87 y.o. female.  The history is provided by the patient and medical records.    87 year old female with history of anxiety, COPD, depression, headaches, hypertension, prior stroke with residual left-sided deficits, presenting to the ED with nausea and vomiting.  She reports she has felt this way at her facility for the past week.  She was given 2 rounds of Zofran today with transient improvement.  She was then switched to Phenergan without improvement.  She vomited again with EMS in route and was given additional 4 mg Zofran.  On arrival, still nauseated and dry heaving.  Denies fever, cough, chills, sick contacts.  No chest pain or SOB.  Past Medical History:  Diagnosis Date  . Anxiety   . COPD (chronic obstructive pulmonary disease) (HCC)   . Depression   . Gastritis   . H/O arteritis    Temporal  . Headache(784.0)    temple artiritis  . Hypertension   . Pneumonia 05/2012  . Stroke [facility])    left sided weakness- transfers with walke rand assiast  . Thyroid nodule     Patient Active Problem List   Diagnosis Date Noted  . Erosive esophagitis 12/19/2016  . Vitamin D deficiency 06/20/2016  . CKD (chronic kidney disease) stage 3, GFR 30-59 ml/min (HCC) 03/27/2016  . PVD (peripheral vascular disease) (HCC) 03/27/2016  . Moderate protein-calorie malnutrition (HCC) 02/22/2016  . Recurrent major depressive disorder (HCC) 02/22/2016  . Uncomplicated opioid dependence (HCC) 04/13/2015  . Age-related osteoporosis without current pathological fracture 04/13/2015  . Major depression, chronic 02/03/2015  . Thyroid activity decreased 02/03/2015  . Anemia of chronic disease 01/11/2015  . Vitamin B12 deficiency anemia 01/02/2015  . Postsurgical hypothyroidism 01/02/2015  . Dry eyes  09/21/2014  . Malnutrition of moderate degree (HCC) 09/21/2014  . Primary generalized (osteo)arthritis 09/21/2014  . Hypothyroidism 06/26/2014  . Giant cell arteritis (HCC) 05/05/2014  . Hemiplegia and hemiparesis following cerebral infarction affecting left non-dominant side (HCC) 05/05/2014  . Generalized osteoarthrosis, involving multiple sites 05/05/2014  . Esophageal reflux 05/05/2014  . Chronic depressive disorder 04/11/2014  . Iron deficiency 04/11/2014  . Major depressive disorder, recurrent episode, unspecified 04/11/2014  . Nonspecific elevation of levels of transaminase or lactic acid dehydrogenase (LDH) 04/11/2014  . Autoimmune disorder (HCC) 01/13/2014  . UTI (urinary tract infection) 01/13/2014  . Generalized osteoarthrosis, unspecified site 12/19/2013  . Senile osteoporosis 12/19/2013  . Autoimmune disease, not elsewhere classified(279.49) 12/19/2013  . Cystitis 10/28/2013  . Dermatitis 10/28/2013  . Insomnia 10/04/2013  . Osteopenia 08/23/2013  . Acute exacerbation of chronic low back pain 07/15/2013  . Anemia, iron deficiency 07/15/2013  . CVA, old, ataxia 07/15/2013  . Osteoarthritis 04/26/2013  . Temporal arteritis (HCC) 04/26/2013  . Essential hypertension, benign 04/24/2013  . UTI (lower urinary tract infection) 04/24/2013  . Depression 02/05/2013  . Rash and nonspecific skin eruption 02/05/2013  . Chronic lower back pain 12/08/2012  . Loss of weight 12/08/2012  . Osteoporosis 12/08/2012  . Dysphagia, unspecified(787.20) 12/08/2012  . Hx of arterial ischemic stroke 06/05/2012  . COPD (chronic obstructive pulmonary disease) (HCC) 06/05/2012  . Abdominal pain, other specified site 06/05/2012    Past Surgical History:  Procedure Laterality Date  . APPENDECTOMY    . CHOLECYSTECTOMY    . ESOPHAGOGASTRODUODENOSCOPY  06/08/2012   Procedure: ESOPHAGOGASTRODUODENOSCOPY (EGD);  Surgeon: Florencia Reasonsobert V Buccini, MD;  Location: [facility] ENDOSCOPY;  Service: Endoscopy;   Laterality: N/A;  . HERNIA REPAIR    . KYPHOPLASTY  2012  . KYPHOPLASTY N/A 11/24/2012   Procedure: KYPHOPLASTY;  Surgeon: Barnett AbuHenry Elsner, MD;  Location: [facility] NEURO ORS;  Service: Neurosurgery;  Laterality: N/A;  Thoracic Five Thoracic Six and Thoracic Seven Acrylic Balloon Kyphoplasty  . OOPHORECTOMY    . THYROID SURGERY       OB History   No obstetric history on file.     No family history on file.  Social History   Tobacco Use  . Smoking status: Former Smoker    Packs/day: 0.50    Years: 20.00    Pack years: 10.00    Quit date: 09/07/2008    Years since quitting: 12.3  . Smokeless tobacco: Never Used  Substance Use Topics  . Alcohol use: No  . Drug use: No    Home Medications Prior to Admission medications   Medication Sig Start Date End Date Taking? Authorizing Provider  acetaminophen (TYLENOL) 325 MG tablet Take 650 mg by mouth every 4 (four) hours as needed.    [provider]  Amino Acids-Protein Hydrolys (FEEDING SUPPLEMENT, PRO-STAT SUGAR FREE 64,) LIQD Take 30 mLs by mouth daily. Stop date 01/02/17    [provider]  calcium-vitamin D (OSCAL WITH D) 500-200 MG-UNIT per tablet Take 1 tablet by mouth 2 (two) times daily.    [provider]  Cholecalciferol 1000 units tablet Take 1,000 Units by mouth daily.    [provider]  clopidogrel (PLAVIX) 75 MG tablet Take 75 mg by mouth daily.    [provider]  Cranberry 200 MG CAPS Take 400 mg by mouth 2 (two) times daily.    [provider]  desvenlafaxine (PRISTIQ) 50 MG 24 hr tablet Take 50 mg by mouth daily.    [provider]  fentaNYL (DURAGESIC - DOSED MCG/HR) 25 MCG/HR patch Apply one patch every 72 hours for pain. Remove old patch. External Use only. Rotate sites. Check placement of patch every shift 09/23/16   Reed, Tiffany L, DO  ferrous sulfate 325 (65 FE) MG tablet Take 325 mg by mouth daily with breakfast.    [provider]   Fluticasone-Salmeterol (ADVAIR) 250-50 MCG/DOSE AEPB Inhale 1 puff into the lungs every 12 (twelve) hours. Rinse mouth after use    [provider]  folic acid (FOLVITE) 1 MG tablet Take 1 mg by mouth daily. For supplement    [provider]  gabapentin (NEURONTIN) 100 MG capsule Take 100 mg by mouth 3 (three) times daily.    [provider]  levothyroxine (SYNTHROID, LEVOTHROID) 25 MCG tablet Take 25 mcg by mouth daily before breakfast.    [provider]  magnesium oxide (MAG-OX) 400 MG tablet Take 400 mg by mouth daily.    [provider]  Melatonin 5 MG TABS Take 1 tablet by mouth at bedtime.    [provider]  mirtazapine (REMERON) 45 MG tablet Take 22.5 mg by mouth at bedtime.    [provider]  ondansetron (ZOFRAN) 4 MG tablet Take 4 mg by mouth every 6 (six) hours as needed for nausea or vomiting.    [provider]  OXYGEN Inhale 2 L into the lungs continuous.    [provider]  pantoprazole (PROTONIX) 20 MG tablet Take 20 mg by mouth 2 (two) times daily.  [provider]  polyethylene glycol powder (GLYCOLAX/MIRALAX) powder Take 17 g by mouth daily. 12/08/12   Lucrezia Starch, NP  polyvinyl alcohol (LIQUIFILM TEARS) 1.4 % ophthalmic solution Place 1 drop into both eyes 2 (two) times daily.     [provider]  predniSONE (DELTASONE) 1 MG tablet Take 1 tablet (1 mg total) by mouth daily. 12/08/12   Lucrezia Starch, NP  senna (SENOKOT) 8.6 MG TABS tablet Take 1 tablet by mouth at bedtime.    [provider]  simvastatin (ZOCOR) 20 MG tablet Take 20 mg by mouth at bedtime.    [provider]  UNABLE TO FIND Med Name: Med pass 90 mL by mouth 2 times daily.    [provider]  vitamin B-12 (CYANOCOBALAMIN) 1000 MCG tablet Take 1,000 mcg by mouth daily.    [provider]    Allergies    Prozac [fluoxetine hcl], Cymbalta [duloxetine hcl], Sulfa antibiotics,  and Codeine  Review of Systems   Review of Systems  Gastrointestinal: Positive for nausea and vomiting.  All other systems reviewed and are negative.   Physical Exam Updated Vital Signs BP (!) 181/90   Pulse 93   Temp 98.2 F (36.8 C) (Oral)   Resp 15   SpO2 97%   Physical Exam Vitals and nursing note reviewed.  Constitutional:      Appearance: She is well-developed.     Comments: Elderly, dry heaving during exam  HENT:     Head: Normocephalic and atraumatic.     Mouth/Throat:     Comments: Dry mucous membranes Eyes:     Conjunctiva/sclera: Conjunctivae normal.     Pupils: Pupils are equal, round, and reactive to light.  Cardiovascular:     Rate and Rhythm: Normal rate and regular rhythm.     Heart sounds: Normal heart sounds.  Pulmonary:     Effort: Pulmonary effort is normal.     Breath sounds: Normal breath sounds.  Abdominal:     General: Bowel sounds are normal.     Palpations: Abdomen is soft.     Tenderness: There is abdominal tenderness.    Musculoskeletal:        General: Normal range of motion.     Cervical back: Normal range of motion.  Skin:    General: Skin is warm and dry.  Neurological:     Mental Status: She is alert and oriented to person, place, and time.     Comments: AAOx3, left hemiparesis from prior stroke (chronic)     ED Results / Procedures / Treatments   Labs (all labs ordered are listed, but only abnormal results are displayed) Labs Reviewed  CBC WITH DIFFERENTIAL/PLATELET - Abnormal; Notable for the following components:      Result Value   Hemoglobin 11.6 (*)    All other components within normal limits  COMPREHENSIVE METABOLIC PANEL - Abnormal; Notable for the following components:   Potassium 3.4 (*)    Albumin 3.2 (*)    All other components within normal limits  URINALYSIS, ROUTINE W REFLEX MICROSCOPIC - Abnormal; Notable for the following components:   Color, Urine STRAW (*)    Ketones, ur 20 (*)    All other  components within normal limits  LIPASE, BLOOD    EKG None  Radiology CT ABDOMEN PELVIS W CONTRAST  Result Date: 12/24/2020 CLINICAL DATA:  Nausea. EXAM: CT ABDOMEN AND PELVIS WITH CONTRAST TECHNIQUE: Multidetector CT imaging of the abdomen and pelvis was performed using the standard  protocol following bolus administration of intravenous contrast. CONTRAST:  OMNIPAQUE IOHEXOL 300 MG/ML  SOLN COMPARISON:  June 06, 2012 FINDINGS: Lower chest: Mild areas of scarring and/or atelectasis are seen within the bilateral lung bases. Hepatobiliary: No focal liver abnormality is seen. Mild central intrahepatic biliary dilatation is noted. Status post cholecystectomy. The common bile duct is dilated and measures 1.5 cm in diameter. Pancreas: Unremarkable. No pancreatic ductal dilatation or surrounding inflammatory changes. Spleen: Normal in size without focal abnormality. Adrenals/Urinary Tract: Adrenal glands are unremarkable. Kidneys are normal in size, without obstructing renal calculi or hydronephrosis. Multiple small, stable bilateral renal cysts are noted. A cluster of subcentimeter calcifications is seen within the dependent portion of the urinary bladder lumen on the right. Stomach/Bowel: There is a small hiatal hernia. The appendix is surgically absent. No evidence of bowel dilatation. Small, noninflamed diverticula are seen within the sigmoid colon. Vascular/Lymphatic: Aortic atherosclerosis. No enlarged abdominal or pelvic lymph nodes. Reproductive: Uterus and bilateral adnexa are unremarkable. Other: No abdominal wall hernia or abnormality. No abdominopelvic ascites. Musculoskeletal: A chronic compression fracture deformity is seen at the level of L1 with evidence of prior vertebroplasty seen at the levels of L2 and L3. Multilevel degenerative changes are noted throughout the lumbar spine. IMPRESSION: 1.   Status post cholecystectomy. 2.   Small hiatal hernia. 3.   Sigmoid diverticulosis. 4.  Chronic compression fracture deformity of L1 with evidence of prior vertebroplasty at the levels of L2 and L3. 5. Findings likely consistent with multiple small urinary bladder calculi. 6. Aortic atherosclerosis. Aortic Atherosclerosis (ICD10-I70.0). Electronically Signed   By: Aram Candela M.D.   On: 12/24/2020 02:20    Procedures Procedures   Medications Ordered in ED Medications - No data to display  ED Course  I have reviewed the triage vital signs and the nursing notes.  Pertinent labs & imaging results that were available during my care of the patient were reviewed by me and considered in my medical decision making (see chart for details).    MDM Rules/Calculators/A&P  87 year old female presenting to the ED with 1 week of nausea and vomiting.  She has had no relief with Zofran and Phenergan at her facility.  She is afebrile and nontoxic in here but is actively dry heaving on exam.  She does have some mild right-sided abdominal tenderness.  Given her persistent symptoms, will obtain labs and CT scan to rule out any acute obstructive process.  She is given IV fluids and Reglan.  Labs are grossly reassuring.  CT without any acute obstructive process.  Does appear to have multiple urinary bladder stones.  UA without signs of infection.  After fluids and Reglan she reports improvement in symptoms.  She has not had any further emesis here in the ED.  She has tolerated full cup of ice chips without difficulty.  Given adequate symptom control and reassuring work-up, feel she is stable for discharge back to facility.  I discussed results with her daughter at the bedside and she is in agreement.  Will give prescription for Zofran as she responded well to this.  Can follow-up with primary care at facility.  Return here for new concerns.  Final Clinical Impression(s) / ED Diagnoses Final diagnoses:  Non-intractable vomiting with nausea, unspecified vomiting type    Rx / DC Orders ED  Discharge Orders         Ordered    metoCLOPramide (REGLAN) 10 MG tablet  3 times daily PRN  12/24/20 0459           Garlon Hatchet, PA-C 12/24/20 0503    Marily Memos, MD 12/24/20 [phone]

## 2020-12-23 NOTE — ED Triage Notes (Signed)
Patient arrives from [facility] with complaints of nausea. Patient was given 2 rounds of zofran throughout the day with some improvement. Patient had continued nausea with 1 round of emesis en route. EMS gave 2 rounds fo 4mg  Zofran.

## 2020-12-24 ENCOUNTER — Encounter (HOSPITAL_COMMUNITY): Payer: Self-pay

## 2020-12-24 ENCOUNTER — Emergency Department (HOSPITAL_COMMUNITY): Payer: Medicare Other

## 2020-12-24 DIAGNOSIS — R112 Nausea with vomiting, unspecified: Secondary | ICD-10-CM | POA: Diagnosis not present

## 2020-12-24 LAB — CBC WITH DIFFERENTIAL/PLATELET
Abs Immature Granulocytes: 0.03 10*3/uL (ref 0.00–0.07)
Basophils Absolute: 0 10*3/uL (ref 0.0–0.1)
Basophils Relative: 0 %
Eosinophils Absolute: 0.2 10*3/uL (ref 0.0–0.5)
Eosinophils Relative: 2 %
HCT: 38.2 % (ref 36.0–46.0)
Hemoglobin: 11.6 g/dL — ABNORMAL LOW (ref 12.0–15.0)
Immature Granulocytes: 0 %
Lymphocytes Relative: 29 %
Lymphs Abs: 2.4 10*3/uL (ref 0.7–4.0)
MCH: 29.2 pg (ref 26.0–34.0)
MCHC: 30.4 g/dL (ref 30.0–36.0)
MCV: 96.2 fL (ref 80.0–100.0)
Monocytes Absolute: 0.8 10*3/uL (ref 0.1–1.0)
Monocytes Relative: 10 %
Neutro Abs: 4.9 10*3/uL (ref 1.7–7.7)
Neutrophils Relative %: 59 %
Platelets: 282 10*3/uL (ref 150–400)
RBC: 3.97 MIL/uL (ref 3.87–5.11)
RDW: 12.9 % (ref 11.5–15.5)
WBC: 8.4 10*3/uL (ref 4.0–10.5)
nRBC: 0 % (ref 0.0–0.2)

## 2020-12-24 LAB — COMPREHENSIVE METABOLIC PANEL
ALT: 17 U/L (ref 0–44)
AST: 26 U/L (ref 15–41)
Albumin: 3.2 g/dL — ABNORMAL LOW (ref 3.5–5.0)
Alkaline Phosphatase: 56 U/L (ref 38–126)
Anion gap: 11 (ref 5–15)
BUN: 14 mg/dL (ref 8–23)
CO2: 28 mmol/L (ref 22–32)
Calcium: 9.6 mg/dL (ref 8.9–10.3)
Chloride: 102 mmol/L (ref 98–111)
Creatinine, Ser: 0.63 mg/dL (ref 0.44–1.00)
GFR, Estimated: >60 mL/min (ref >60)
Glucose, Bld: 85 mg/dL (ref 70–99)
Potassium: 3.4 mmol/L — ABNORMAL LOW (ref 3.5–5.1)
Sodium: 141 mmol/L (ref 135–145)
Total Bilirubin: 0.8 mg/dL (ref 0.3–1.2)
Total Protein: 7.8 g/dL (ref 6.5–8.1)

## 2020-12-24 LAB — URINALYSIS, ROUTINE W REFLEX MICROSCOPIC
Bilirubin Urine: NEGATIVE
Glucose, UA: NEGATIVE mg/dL
Hgb urine dipstick: NEGATIVE
Ketones, ur: 20 mg/dL — AB
Leukocytes,Ua: NEGATIVE
Nitrite: NEGATIVE
Protein, ur: NEGATIVE mg/dL
Specific Gravity, Urine: 1.018 (ref 1.005–1.030)
pH: 8 (ref 5.0–8.0)

## 2020-12-24 LAB — LIPASE, BLOOD: Lipase: 25 U/L (ref 11–51)

## 2020-12-24 MED ORDER — IOHEXOL 300 MG/ML  SOLN
100.0000 mL | Freq: Once | INTRAMUSCULAR | Status: AC | PRN
  Administered 2020-12-24: 100 mL via INTRAVENOUS

## 2020-12-24 MED ORDER — METOCLOPRAMIDE HCL 10 MG PO TABS: 10.0000 mg | ORAL_TABLET | Freq: Three times a day (TID) | ORAL | 0 refills | Status: DC | PRN

## 2020-12-24 NOTE — ED Notes (Signed)
Attempted to call facility to give report, no answer.

## 2020-12-24 NOTE — Discharge Instructions (Signed)
Labs and CT scan today were reassuring-- no obstructive findings to cause nausea/vomiting. Prescription for reglan written as she responded well to this in the ED. Follow-up with primary care. Return here for new concerns.

## 2020-12-24 NOTE — ED Notes (Signed)
Pt back to facility without issue via [facility], report given to [facility] with no further questions. Discharged in stable condition at this time.

## 2021-06-30 ENCOUNTER — Emergency Department (HOSPITAL_COMMUNITY): Payer: Medicare Other

## 2021-06-30 ENCOUNTER — Encounter (HOSPITAL_COMMUNITY): Payer: Self-pay | Admitting: Emergency Medicine

## 2021-06-30 ENCOUNTER — Other Ambulatory Visit: Payer: Self-pay

## 2021-06-30 ENCOUNTER — Emergency Department (HOSPITAL_COMMUNITY)
Admission: EM | Admit: 2021-06-30 | Discharge: 2021-07-01 | Disposition: A | Discharge status: Skilled Nursing Facility | Payer: Medicare Other | Attending: Emergency Medicine | Admitting: Emergency Medicine

## 2021-06-30 DIAGNOSIS — Z7902 Long term (current) use of antithrombotics/antiplatelets: Secondary | ICD-10-CM | POA: Insufficient documentation

## 2021-06-30 DIAGNOSIS — R638 Other symptoms and signs concerning food and fluid intake: Secondary | ICD-10-CM | POA: Insufficient documentation

## 2021-06-30 DIAGNOSIS — E039 Hypothyroidism, unspecified: Secondary | ICD-10-CM | POA: Diagnosis not present

## 2021-06-30 DIAGNOSIS — Z87891 Personal history of nicotine dependence: Secondary | ICD-10-CM | POA: Diagnosis not present

## 2021-06-30 DIAGNOSIS — R29810 Facial weakness: Secondary | ICD-10-CM | POA: Insufficient documentation

## 2021-06-30 DIAGNOSIS — R140 Abdominal distension (gaseous): Secondary | ICD-10-CM | POA: Diagnosis not present

## 2021-06-30 DIAGNOSIS — I129 Hypertensive chronic kidney disease with stage 1 through stage 4 chronic kidney disease, or unspecified chronic kidney disease: Secondary | ICD-10-CM | POA: Insufficient documentation

## 2021-06-30 DIAGNOSIS — N183 Chronic kidney disease, stage 3 unspecified: Secondary | ICD-10-CM | POA: Insufficient documentation

## 2021-06-30 DIAGNOSIS — R531 Weakness: Secondary | ICD-10-CM | POA: Diagnosis not present

## 2021-06-30 DIAGNOSIS — R1310 Dysphagia, unspecified: Secondary | ICD-10-CM

## 2021-06-30 DIAGNOSIS — Z79899 Other long term (current) drug therapy: Secondary | ICD-10-CM | POA: Diagnosis not present

## 2021-06-30 DIAGNOSIS — Z20822 Contact with and (suspected) exposure to covid-19: Secondary | ICD-10-CM | POA: Insufficient documentation

## 2021-06-30 DIAGNOSIS — J449 Chronic obstructive pulmonary disease, unspecified: Secondary | ICD-10-CM | POA: Insufficient documentation

## 2021-06-30 LAB — CBC
HCT: 41.2 % (ref 36.0–46.0)
Hemoglobin: 12.4 g/dL (ref 12.0–15.0)
MCH: 29.1 pg (ref 26.0–34.0)
MCHC: 30.1 g/dL (ref 30.0–36.0)
MCV: 96.7 fL (ref 80.0–100.0)
Platelets: 245 10*3/uL (ref 150–400)
RBC: 4.26 MIL/uL (ref 3.87–5.11)
RDW: 13.8 % (ref 11.5–15.5)
WBC: 12.2 10*3/uL — ABNORMAL HIGH (ref 4.0–10.5)
nRBC: 0 % (ref 0.0–0.2)

## 2021-06-30 LAB — COMPREHENSIVE METABOLIC PANEL
ALT: 19 U/L (ref 0–44)
AST: 26 U/L (ref 15–41)
Albumin: 3 g/dL — ABNORMAL LOW (ref 3.5–5.0)
Alkaline Phosphatase: 70 U/L (ref 38–126)
Anion gap: 9 (ref 5–15)
BUN: 12 mg/dL (ref 8–23)
CO2: 36 mmol/L — ABNORMAL HIGH (ref 22–32)
Calcium: 12.3 mg/dL — ABNORMAL HIGH (ref 8.9–10.3)
Chloride: 98 mmol/L (ref 98–111)
Creatinine, Ser: 0.87 mg/dL (ref 0.44–1.00)
GFR, Estimated: >60 mL/min (ref >60)
Glucose, Bld: 107 mg/dL — ABNORMAL HIGH (ref 70–99)
Potassium: 3.5 mmol/L (ref 3.5–5.1)
Sodium: 143 mmol/L (ref 135–145)
Total Bilirubin: 0.6 mg/dL (ref 0.3–1.2)
Total Protein: 7.7 g/dL (ref 6.5–8.1)

## 2021-06-30 LAB — RESP PANEL BY RT-PCR (FLU A&B, COVID) ARPGX2
Influenza A by PCR: NEGATIVE
Influenza B by PCR: NEGATIVE
SARS Coronavirus 2 by RT PCR: NEGATIVE

## 2021-06-30 LAB — LACTIC ACID, PLASMA: Lactic Acid, Venous: 1 mmol/L (ref 0.5–1.9)

## 2021-06-30 NOTE — ED Notes (Signed)
[facility] called  

## 2021-06-30 NOTE — ED Provider Notes (Signed)
[facility] EMERGENCY DEPARTMENT Provider Note   CSN: [id number] Arrival date & time: 06/30/21  1216     History No chief complaint on file.   Erica French is a 87 y.o. female.  HPI  Level 5 caveat secondary to aphasia 87 year old female history of COPD, stroke, pneumonia, presents today with reports that she has been not feeling well this week with question of new right-sided weakness, especially right facial droop, and difficulty swallowing.  Her daughter is at the bedside and is providing the history.  She states that last Sunday she began having generalized weakness and that this is usually due to pneumonia she has had this in the past.  She reports that she was diagnosed at the skilled nursing facility, [facility], as having pneumonia.  She was started on antibiotics.  On Wednesday, the daughter found out that they had been crushing her pills for that week because she had been having difficulty swallowing.  This is a new problem for her.  Her daughter also feels that since Thursday she has had some facial droop on the right.  She is bedbound at baseline.  Is difficult to assess the strength on the right side and is unclear whether or not she has any decrease in strength in the right arm or right leg.  There is been no fever reported, cough, or dyspnea although reported pneumonia.  Reports that she has been having decreased p.o. intake which is attributed to difficulty swallowing.  They did attempt to feed her but she was unable to swallow it and "spit it out".  There is no report of vomiting or diarrhea.     Past Medical History:  Diagnosis Date   Anxiety    COPD (chronic obstructive pulmonary disease) (HCC)    Depression    Gastritis    H/O arteritis    Temporal   Headache(784.0)    temple artiritis   Hypertension    Pneumonia 05/2012   Stroke [facility])    left sided weakness- transfers with walke rand assiast   Thyroid nodule     Patient Active  Problem List   Diagnosis Date Noted   Erosive esophagitis 12/19/2016   Vitamin D deficiency 06/20/2016   CKD (chronic kidney disease) stage 3, GFR 30-59 ml/min (HCC) 03/27/2016   PVD (peripheral vascular disease) (HCC) 03/27/2016   Moderate protein-calorie malnutrition (HCC) 02/22/2016   Recurrent major depressive disorder (HCC) 02/22/2016   Uncomplicated opioid dependence (HCC) 04/13/2015   Age-related osteoporosis without current pathological fracture 04/13/2015   Major depression, chronic 02/03/2015   Thyroid activity decreased 02/03/2015   Anemia of chronic disease 01/11/2015   Vitamin B12 deficiency anemia 01/02/2015   Postsurgical hypothyroidism 01/02/2015   Dry eyes 09/21/2014   Malnutrition of moderate degree (HCC) 09/21/2014   Primary generalized (osteo)arthritis 09/21/2014   Hypothyroidism 06/26/2014   Giant cell arteritis (HCC) 05/05/2014   Hemiplegia and hemiparesis following cerebral infarction affecting left non-dominant side (HCC) 05/05/2014   Generalized osteoarthrosis, involving multiple sites 05/05/2014   Esophageal reflux 05/05/2014   Chronic depressive disorder 04/11/2014   Iron deficiency 04/11/2014   Major depressive disorder, recurrent episode, unspecified 04/11/2014   Nonspecific elevation of levels of transaminase or lactic acid dehydrogenase (LDH) 04/11/2014   Autoimmune disorder (HCC) 01/13/2014   UTI (urinary tract infection) 01/13/2014   Generalized osteoarthrosis, unspecified site 12/19/2013   Senile osteoporosis 12/19/2013   Autoimmune disease, not elsewhere classified(279.49) 12/19/2013   Cystitis 10/28/2013   Dermatitis 10/28/2013   Insomnia  10/04/2013   Osteopenia 08/23/2013   Acute exacerbation of chronic low back pain 07/15/2013   Anemia, iron deficiency 07/15/2013   CVA, old, ataxia 07/15/2013   Osteoarthritis 04/26/2013   Temporal arteritis (HCC) 04/26/2013   Essential hypertension, benign 04/24/2013   UTI (lower urinary tract infection)  04/24/2013   Depression 02/05/2013   Rash and nonspecific skin eruption 02/05/2013   Chronic lower back pain 12/08/2012   Loss of weight 12/08/2012   Osteoporosis 12/08/2012   Dysphagia, unspecified(787.20) 12/08/2012   Hx of arterial ischemic stroke 06/05/2012   COPD (chronic obstructive pulmonary disease) (HCC) 06/05/2012   Abdominal pain, other specified site 06/05/2012    Past Surgical History:  Procedure Laterality Date   APPENDECTOMY     CHOLECYSTECTOMY     ESOPHAGOGASTRODUODENOSCOPY  06/08/2012   Procedure: ESOPHAGOGASTRODUODENOSCOPY (EGD);  Surgeon: Florencia Reasons, MD;  Location: [facility] ENDOSCOPY;  Service: Endoscopy;  Laterality: N/A;   HERNIA REPAIR     KYPHOPLASTY  2012   KYPHOPLASTY N/A 11/24/2012   Procedure: KYPHOPLASTY;  Surgeon: Barnett Abu, MD;  Location: [facility] NEURO ORS;  Service: Neurosurgery;  Laterality: N/A;  Thoracic Five Thoracic Six and Thoracic Seven Acrylic Balloon Kyphoplasty   OOPHORECTOMY     THYROID SURGERY       OB History   No obstetric history on file.     History reviewed. No pertinent family history.  Social History   Tobacco Use   Smoking status: Former    Packs/day: 0.50    Years: 20.00    Pack years: 10.00    Types: Cigarettes    Quit date: 09/07/2008    Years since quitting: 12.8   Smokeless tobacco: Never  Substance Use Topics   Alcohol use: No   Drug use: No    Home Medications Prior to Admission medications   Medication Sig Start Date End Date Taking? Authorizing Provider  acetaminophen (TYLENOL) 325 MG tablet Take 650 mg by mouth every 4 (four) hours as needed for mild pain.   Yes [provider]  amoxicillin-clavulanate (AUGMENTIN) 875-125 MG tablet Take 1 tablet by mouth 2 (two) times daily. 06/25/21 07/01/21 Yes [provider]  Cholecalciferol 1.25 MG (50000 UT) TABS Take 50,000 Units by mouth every Monday.   Yes [provider]  clopidogrel (PLAVIX) 75 MG tablet Take 75 mg by mouth daily.    Yes [provider]  denosumab (PROLIA) 60 MG/ML SOSY injection Inject 60 mg into the skin every 6 (six) months.   Yes [provider]  desvenlafaxine (PRISTIQ) 100 MG 24 hr tablet Take 100 mg by mouth daily.   Yes [provider]  fentaNYL (DURAGESIC - DOSED MCG/HR) 25 MCG/HR patch Apply one patch every 72 hours for pain. Remove old patch. External Use only. Rotate sites. Check placement of patch every shift Patient taking differently: Place 1 patch onto the skin every 3 (three) days. 09/23/16  Yes Reed, Tiffany L, DO  gabapentin (NEURONTIN) 100 MG capsule Take 100 mg by mouth at bedtime.   Yes [provider]  HYDROcodone-acetaminophen (NORCO/VICODIN) 5-325 MG tablet Take 1 tablet by mouth 2 (two) times daily as needed for moderate pain. 07/17/20  Yes [provider]  levothyroxine (SYNTHROID, LEVOTHROID) 25 MCG tablet Take 25 mcg by mouth daily before breakfast.   Yes [provider]  loperamide (IMODIUM) 2 MG capsule Take 2-4 mg by mouth See admin instructions. 4mg  after first loose stool then 2mg  after each loose stool.   Yes [provider]  magnesium oxide (MAG-OX) 400 MG tablet Take 400 mg by mouth daily.   Yes [provider]  mirtazapine (REMERON) 45 MG tablet Take 45 mg by mouth at bedtime.   Yes [provider]  polyethylene glycol powder (GLYCOLAX/MIRALAX) powder Take 17 g by mouth daily. 12/08/12  Yes Lucrezia Starch, NP  predniSONE (DELTASONE) 1 MG tablet Take 1 tablet (1 mg total) by mouth daily. 12/08/12  Yes Lucrezia Starch, NP  saccharomyces boulardii (FLORASTOR) 250 MG capsule Take 250 mg by mouth 2 (two) times daily. For 10 days 06/25/21 07/05/21 Yes [provider]  senna (SENOKOT) 8.6 MG TABS tablet Take 1 tablet by mouth at bedtime.   Yes [provider]  traZODone (DESYREL) 50 MG tablet Take 12.5 mg by mouth daily.   Yes [provider]    Allergies    Prozac [fluoxetine  hcl], Cymbalta [duloxetine hcl], Sulfa antibiotics, and Codeine  Review of Systems   Review of Systems  All other systems reviewed and are negative.  Physical Exam Updated Vital Signs BP (!) 134/59   Pulse 90   Temp 98.9 F (37.2 C) (Rectal)   Resp (!) 27   SpO2 93%   Physical Exam Vitals and nursing note reviewed.  Constitutional:      Appearance: Normal appearance.  HENT:     Head: Normocephalic.     Right Ear: External ear normal.     Left Ear: External ear normal.     Mouth/Throat:     Mouth: Mucous membranes are dry.  Eyes:     Extraocular Movements: Extraocular movements intact.     Pupils: Pupils are equal, round, and reactive to light.  Cardiovascular:     Rate and Rhythm: Normal rate and regular rhythm.     Pulses: Normal pulses.  Pulmonary:     Effort: Pulmonary effort is normal.     Breath sounds: Normal breath sounds.  Abdominal:     General: There is distension.     Palpations: Abdomen is soft.     Comments: There is some distention of the abdomen noted mild tenderness  Musculoskeletal:     Cervical back: Normal range of motion.     Comments: Contractures noted on left She has decreased range of motion of the right shoulder but good movement in her right wrist and elbow. There does not appear to be limitation of range of motion the right lower extremity  Skin:    General: Skin is warm and dry.     Capillary Refill: Capillary refill takes less than 2 seconds.     Comments: Skin was examined and there is a fentanyl patch on the right chest wall There appears to be some early sacral skin breakdown  Neurological:     Mental Status: She is alert.     Comments: Left-sided facial droop noted difficult to assess of right sided facial droop is present but patient appears to move the right side of her mouth adequately She has chronic contractures of the left side. She has some decreased movement of the right shoulder but this seems to be more affected by  musculoskeletal issues She has some decreased strength of her right lower extremity but Is unclear.    ED Results / Procedures / Treatments   Labs (all labs ordered are listed, but only abnormal results are displayed) Labs Reviewed  CBC - Abnormal; Notable for the following components:      Result Value   WBC 12.2 (*)  All other components within normal limits  COMPREHENSIVE METABOLIC PANEL - Abnormal; Notable for the following components:   CO2 36 (*)    Glucose, Bld 107 (*)    Calcium 12.3 (*)    Albumin 3.0 (*)    All other components within normal limits  RESP PANEL BY RT-PCR (FLU A&B, COVID) ARPGX2  CULTURE, BLOOD (ROUTINE X 2)  CULTURE, BLOOD (ROUTINE X 2)  LACTIC ACID, PLASMA  URINALYSIS, ROUTINE W REFLEX MICROSCOPIC    EKG EKG Interpretation  Date/Time:  Saturday June 30 2021 12:33:11 EDT Ventricular Rate:  88 PR Interval:  120 QRS Duration: 118 QT Interval:  380 QTC Calculation: 460 R Axis:   -79 Text Interpretation: Sinus rhythm Left anterior fascicular block Anterior infarct, old Confirmed by Margarita Grizzle [phone]) on 06/30/2021 4:17:11 PM  Radiology CT Head Wo Contrast  Result Date: 06/30/2021 CLINICAL DATA:  Mental status change, unknown cause EXAM: CT HEAD WITHOUT CONTRAST TECHNIQUE: Contiguous axial images were obtained from the base of the skull through the vertex without intravenous contrast. COMPARISON:  None. FINDINGS: Brain: There is no acute intracranial hemorrhage, mass effect, or edema. Gray-white differentiation is preserved. Chronic infarct right corona radiata and basal ganglia. Confluent areas of low-density in the supratentorial white matter nonspecific but probably reflect advanced chronic microvascular ischemic changes. Small chronic left cerebellar vermis infarct. There is no extra-axial fluid collection. Prominence of ventricles and sulci reflects parenchymal volume loss. Vascular: There is atherosclerotic calcification at the skull base.  Skull: Calvarium is unremarkable. Sinuses/Orbits: Minor mucosal thickening. Sequelae of chronic left maxillary sinus inflammation. Orbits are unremarkable. Other: None. IMPRESSION: No acute intracranial abnormality. Chronic microvascular ischemic changes and chronic infarcts as described. Electronically Signed   By: Guadlupe Spanish M.D.   On: 06/30/2021 14:54   DG Chest Port 1 View  Result Date: 06/30/2021 CLINICAL DATA:  Pneumonia EXAM: PORTABLE CHEST 1 VIEW COMPARISON:  Chest x-Ivory Maduro dated November 24, 2012 FINDINGS: Cardiac and mediastinal contours are within normal limits. Atherosclerotic disease of the thoracic aorta. No definite focal opacity although evaluation is limited due to patient rotation. Blunting of the bilateral costophrenic angles, possibly due to pleural thickening versus small pleural effusion. No large pleural effusion or evidence of pneumothorax. IMPRESSION: No definite focal opacity although evaluation is limited due to patient rotation. Blunting of the bilateral costophrenic angles, possibly due to pleural thickening versus small pleural effusion. Electronically Signed   By: Allegra Lai M.D.   On: 06/30/2021 13:39    Procedures Procedures   Medications Ordered in ED Medications - No data to display  ED Course  I have reviewed the triage vital signs and the nursing notes.  Pertinent labs & imaging results that were available during my care of the patient were reviewed by me and considered in my medical decision making (see chart for details).    MDM Rules/Calculators/A&P                          87 year old female history of prior stroke with left-sided paralysis presents today with concerns for some increased right-sided weakness.  Recent diagnosis of pneumonia and on antibiotics at her skilled nursing facility.  On my exam there is no obvious change in the right although she does have some generalized weakness the left and the left side is flaccid.  Symptoms have been  present for greater than 48 hours with last known normal on Wednesday.  CBC with mild leukocytosis of 12,200 and chemistries  generally within normal limits with the exception of a mild hypoalbuminemia and CO2 at 36.  CT obtained head shows no evidence of acute abnormalities. Discussed with daughter and she voices concern again about swallowing Plan cracker and see if patient can swallow Patient does not have her lower dentures in.  Her daughter states she has been eating a diet however without these.  She also has a study plan for swallowing on Monday.  I discussed all the above results with the patient's daughter.  She does not appear to be volume depleted and has been able to take p.o. including thickened liquids.  She has studies set up as an outpatient.  She has had some increased weakness on the right according to her daughter but is not definitive on exam and with last known normal on Wednesday, a normal head CT, did not feel that we would do anything differently if we were to hospitalize her.  I discussed palliative care with her daughter who is quite interested in a referral.  We have discussed her ongoing outpatient treatment, return precautions and plan for referral to palliative care as outpatient. Patient appears stable for discharge to skilled nursing facility.    Final Clinical Impression(s) / ED Diagnoses Final diagnoses:  Dysphagia, unspecified type    Rx / DC Orders ED Discharge Orders     None        Margarita Grizzle, MD 06/30/21 [phone]

## 2021-06-30 NOTE — ED Notes (Signed)
Pt daughter is worried about mom not swallowing. I informed daughter that SLP will be consulted to her facility Monday to evaluate. I informed daughter to make sure pt HOB is elevated, prop a pillow under her head since she likes to lean forward, small scoops of the recommended diet, and sips in between feedings. Pt was able to eat three scoops of applesauce with coaching and sips in between with no problems.

## 2021-06-30 NOTE — ED Triage Notes (Signed)
Arrived via EMS; c/o right sided leaning and facial droop that's progressed the last 3 days; endorsed hx of left sided hemiplegia and facial droop.Sp02 86% on RA, improved with 2L [place], Reported recent dx of PNA and is on ABX,

## 2021-06-30 NOTE — ED Notes (Signed)
Patient transported to CT 

## 2021-06-30 NOTE — Discharge Instructions (Addendum)
Please use thickened liquids such as Ensure and other liquids until studies have been completed. Please call palliative care Monday if they have not contacted you by then. Return if you are having any new concerns or worsening problems

## 2021-06-30 NOTE — ED Notes (Signed)
Patient able to swallow thin liquids, patient doesn't have lower dentures and unable to "chew" crackers,

## 2021-06-30 NOTE — ED Notes (Signed)
Pt's oxygen dropped to 86% RA, placed on 3L of O2 with sats maintaining at 93%.

## 2021-06-30 NOTE — ED Notes (Signed)
Bladder scan volume = 89 ml

## 2021-07-03 ENCOUNTER — Non-Acute Institutional Stay: Payer: Medicare Other | Admitting: Student

## 2021-07-03 ENCOUNTER — Other Ambulatory Visit: Payer: Self-pay

## 2021-07-03 DIAGNOSIS — R0602 Shortness of breath: Secondary | ICD-10-CM

## 2021-07-03 DIAGNOSIS — R52 Pain, unspecified: Secondary | ICD-10-CM

## 2021-07-03 DIAGNOSIS — Z515 Encounter for palliative care: Secondary | ICD-10-CM

## 2021-07-03 DIAGNOSIS — I6930 Unspecified sequelae of cerebral infarction: Secondary | ICD-10-CM

## 2021-07-03 DIAGNOSIS — D72829 Elevated white blood cell count, unspecified: Secondary | ICD-10-CM

## 2021-07-03 NOTE — Progress Notes (Signed)
[facility] Consult Note Telephone: [phone]  Fax: [phone]   Date of encounter: 07/03/21 4:43 PM PATIENT NAME: Erica French [address] [place] [address]   [phone] (home)  DOB: 07-Aug-1930 MRN: [id number] PRIMARY CARE PROVIDER:    Dr. Shon Baton PROVIDER:   Yates Decamp, NP  RESPONSIBLE PARTY:    Contact Information     Name Relation Home Work [place] Daughter [phone]  [phone]   [facility] Daughter [phone]  [phone]   [facility] Daughter [phone]  [phone]        I met face to face with patient and family in the facility. Palliative Care was asked to follow this patient by consultation request of  Yates Decamp, NP to address advance care planning and complex medical decision making. This is the initial visit.                                     ASSESSMENT AND PLAN / RECOMMENDATIONS:   Advance Care Planning/Goals of Care: Goals include to maximize quality of life and symptom management. Patient/health care surrogate gave his/her permission to discuss.Our advance care planning conversation included a discussion about:    The value and importance of advance care planning  Experiences with loved ones who have been seriously ill or have died  Exploration of personal, cultural or spiritual beliefs that might influence medical decisions  Exploration of goals of care in the event of a sudden injury or illness  Shared HCPOA with daughters Decision not to resuscitate or to de-escalate disease focused treatments due to poor prognosis. CODE STATUS: DNR  Education provided on palliative medicine versus hospice services.  Given patient recent declines, CVA, recent infections patient appears to be eligible for hospice services. Daughter Erica French would like to discuss further with her sister regarding transitioning to hospice services.  With plans to start on another  antibiotic, daughter would like to see how patient does first.  Discussed follow-up on Friday to see if patient has shown any improvement.  We discussed aggressive treatments versus comfort path.  Palliative medicine will continue to provide support.  Symptom Management/Plan:  Leukocytosis-  WBC 12.2. patient completed antibiotics on 10/16 for pneumonia.  Facility NP plans to start another antibiotic given her leukocytosis.  No fever or chills reported.  UA was negative per NP facility NP.  Late effect CVA-patient with worsening dysphagia, she is dependent for all ADLs. Patient only taking in small amounts of fluid at this time, nutritional supplements. Crush medications; discussed with staff putting in small amount of liquids as she is having difficulty with solids. Facility staff to continue to provide supportive care and anticipate care needs. Monitor for aspiration; utilize aspiration precautions.   Shortness of breath-secondary to COPD, recent pneumonia.  Continue oxygen as directed per facility orders.  Pain- continue Duragesic 25 mcg every 72 hours, gabapentin 100 mg QHS, norco PRN. Recommend acetaminophen routinely; discussed with facility NP.    Follow up Palliative Care Visit: Palliative care will continue to follow for complex medical decision making, advance care planning, and clarification of goals. Return in 2-4 weeks or prn.  I spent 50 minutes providing this consultation. More than 50% of the time in this consultation was spent in counseling and care coordination.   PPS: 30%, weak  HOSPICE ELIGIBILITY/DIAGNOSIS: TBD  Chief Complaint: Palliative Medicine initial consult.  HISTORY OF PRESENT  ILLNESS:  Erica French is a 87 y.o. year old female  with late effect CVA with hemiplegia, hemiparesis affecting left nondominant side, dysphagia, COPD, giant cell arteritis, contractures to bilateral ankles, contracture of left wrist, hyperlipidemia, major depressive disorder,  generalized anxiety, peripheral vascular disease, OSA, hypertension, hyperlipidemia, hypothyroidism, GERD, osteoporosis.  Patient with decline in past week. Patient has been primarily bedbound and dependent for ADLs due to history of stroke. Patient was treated for pneumonia last week; completed Augmentin on 07/01/21. Daughter reports that patient had increased difficulty with swallowing; she has not been able to tolerate any solids. Family reports that the only time she has any intake is if family attempts to feed or provide fluids. Patient was sent to ED  over the weekend due to family stating she "was barely responsive."  She returned Sunday a.m.  Daughter states patient had not urinated; she has started urinating again.  Facility provider plans to order another antibiotic due to her leukocytosis. In speaking to NP and facility he states that UA was negative. Daughter states patient grimaces and yells out in pain when being turned, repositioned. She has shortness of breath with exertion. Her medications are being crushed. Patient current weight 119. 6 pounds. She was 125 pounds in 04/2021. Patient is sleeping more throughout the day, less engaged. Family states that patient is a DNR, no feeding tube.   Patient received resting in bed. She arouses to verbal and tactile stimulation. She denies pain at present. She does moan out when her feet are touched. She has foam dressing to bilateral heels. Bilateral foot drop noted. She has contracture to left wrist. Patient dozes off during visit. HPI and ROS primarily obtained from daughter and staff.   History obtained from review of EMR, discussion with primary team, and interview with family, facility staff/caregiver and/or [name].  I reviewed available labs, medications, imaging, studies and related documents from the EMR.  Records reviewed and summarized above.    Physical Exam: Pulse 104, resp 20, b/p 122/58, sats 97% at 2 lpm Constitutional:  NAD General: frail appearing, thin, ill appearing EYES: anicteric sclera, lids intact, no discharge  ENMT: intact hearing, oral mucous membranes dry, dentition intact CV: S1S2, RRR, no LE edema Pulmonary: Lung sounds diminished, no cough Abdomen: normo-active BS + 4 quadrants, soft and non tender GU: deferred MSK: sarcopenia, non- ambulatory Skin: cool and dry, no rashes or wounds on visible skin Neuro: generalized weakness, A & O x 2 Psych: non-anxious affect Hem/lymph/immuno: no widespread bruising  CURRENT PROBLEM LIST:  Patient Active Problem List   Diagnosis Date Noted   Erosive esophagitis 12/19/2016   Vitamin D deficiency 06/20/2016   CKD (chronic kidney disease) stage 3, GFR 30-59 ml/min (HCC) 03/27/2016   PVD (peripheral vascular disease) ([place]) 03/27/2016   Moderate protein-calorie malnutrition ([place]) 02/22/2016   Recurrent major depressive disorder ([place]) 48/54/6270   Uncomplicated opioid dependence ([place]) 04/13/2015   Age-related osteoporosis without current pathological fracture 04/13/2015   Major depression, chronic 02/03/2015   Thyroid activity decreased 02/03/2015   Anemia of chronic disease 01/11/2015   Vitamin B12 deficiency anemia 01/02/2015   Postsurgical hypothyroidism 01/02/2015   Dry eyes 09/21/2014   Malnutrition of moderate degree ([place]) 09/21/2014   Primary generalized (osteo)arthritis 09/21/2014   Hypothyroidism 06/26/2014   Giant cell arteritis ([place]) 05/05/2014   Hemiplegia and hemiparesis following cerebral infarction affecting left non-dominant side ([place]) 05/05/2014   Generalized osteoarthrosis, involving multiple sites 05/05/2014   Esophageal reflux 05/05/2014   Chronic  depressive disorder 04/11/2014   Iron deficiency 04/11/2014   Major depressive disorder, recurrent episode, unspecified 04/11/2014   Nonspecific elevation of levels of transaminase or lactic acid dehydrogenase (LDH) 04/11/2014   Autoimmune disorder ([place]) 01/13/2014   UTI (urinary  tract infection) 01/13/2014   Generalized osteoarthrosis, unspecified site 12/19/2013   Senile osteoporosis 12/19/2013   Autoimmune disease, not elsewhere classified(279.49) 12/19/2013   Cystitis 10/28/2013   Dermatitis 10/28/2013   Insomnia 10/04/2013   Osteopenia 08/23/2013   Acute exacerbation of chronic low back pain 07/15/2013   Anemia, iron deficiency 07/15/2013   CVA, old, ataxia 07/15/2013   Osteoarthritis 04/26/2013   Temporal arteritis ([place]) 04/26/2013   Essential hypertension, benign 04/24/2013   UTI (lower urinary tract infection) 04/24/2013   Depression 02/05/2013   Rash and nonspecific skin eruption 02/05/2013   Chronic lower back pain 12/08/2012   Loss of weight 12/08/2012   Osteoporosis 12/08/2012   Dysphagia, unspecified(787.20) 12/08/2012   Hx of arterial ischemic stroke 06/05/2012   COPD (chronic obstructive pulmonary disease) ([place]) 06/05/2012   Abdominal pain, other specified site 06/05/2012   PAST MEDICAL HISTORY:  Active Ambulatory Problems    Diagnosis Date Noted   Hx of arterial ischemic stroke 06/05/2012   COPD (chronic obstructive pulmonary disease) ([place]) 06/05/2012   Abdominal pain, other specified site 06/05/2012   Chronic lower back pain 12/08/2012   Loss of weight 12/08/2012   Osteoporosis 12/08/2012   Dysphagia, unspecified(787.20) 12/08/2012   Depression 02/05/2013   Rash and nonspecific skin eruption 02/05/2013   Essential hypertension, benign 04/24/2013   UTI (lower urinary tract infection) 04/24/2013   Osteoarthritis 04/26/2013   Temporal arteritis ([place]) 04/26/2013   Acute exacerbation of chronic low back pain 07/15/2013   Anemia, iron deficiency 07/15/2013   CVA, old, ataxia 07/15/2013   Osteopenia 08/23/2013   Insomnia 10/04/2013   Cystitis 10/28/2013   Dermatitis 10/28/2013   Generalized osteoarthrosis, unspecified site 12/19/2013   Senile osteoporosis 12/19/2013   Autoimmune disease, not elsewhere classified(279.49) 12/19/2013    Autoimmune disorder ([place]) 01/13/2014   UTI (urinary tract infection) 01/13/2014   Chronic depressive disorder 04/11/2014   Iron deficiency 04/11/2014   Major depressive disorder, recurrent episode, unspecified 04/11/2014   Nonspecific elevation of levels of transaminase or lactic acid dehydrogenase (LDH) 04/11/2014   Giant cell arteritis ([place]) 05/05/2014   Hemiplegia and hemiparesis following cerebral infarction affecting left non-dominant side ([place]) 05/05/2014   Generalized osteoarthrosis, involving multiple sites 05/05/2014   Esophageal reflux 05/05/2014   Hypothyroidism 06/26/2014   Dry eyes 09/21/2014   Malnutrition of moderate degree ([place]) 09/21/2014   Primary generalized (osteo)arthritis 09/21/2014   Vitamin B12 deficiency anemia 01/02/2015   Postsurgical hypothyroidism 01/02/2015   Anemia of chronic disease 01/11/2015   Major depression, chronic 02/03/2015   Thyroid activity decreased 98/92/1194   Uncomplicated opioid dependence ([place]) 04/13/2015   Age-related osteoporosis without current pathological fracture 04/13/2015   Moderate protein-calorie malnutrition ([place]) 02/22/2016   Recurrent major depressive disorder ([place]) 02/22/2016   CKD (chronic kidney disease) stage 3, GFR 30-59 ml/min ([place]) 03/27/2016   PVD (peripheral vascular disease) ([place]) 03/27/2016   Vitamin D deficiency 06/20/2016   Erosive esophagitis 12/19/2016   Resolved Ambulatory Problems    Diagnosis Date Noted   FUO (fever of unknown origin) 06/05/2012   Vomiting, persistent, in adult 06/05/2012   Esophagitis 06/09/2012   Ileus, postoperative ([place]) 12/08/2012   Unspecified constipation 12/08/2012   Pneumonia 04/26/2013   GERD (gastroesophageal reflux disease) 08/23/2013   Unspecified essential hypertension 12/19/2013  Acute cholangitis 01/13/2014   Essential hypertension 01/02/2015   Past Medical History:  Diagnosis Date   Anxiety    Gastritis    H/O arteritis    Headache(784.0)    Hypertension     Stroke ([place])    Thyroid nodule    SOCIAL HX:  Social History   Tobacco Use   Smoking status: Former    Packs/day: 0.50    Years: 20.00    Pack years: 10.00    Types: Cigarettes    Quit date: 09/07/2008    Years since quitting: 12.8   Smokeless tobacco: Never  Substance Use Topics   Alcohol use: No   FAMILY HX: No family history on file.    ALLERGIES:  Allergies  Allergen Reactions   Prozac [Fluoxetine Hcl] Anaphylaxis, Hives, Itching, Swelling and Rash   Cymbalta [Duloxetine Hcl]    Sulfa Antibiotics    Codeine Itching and Rash     PERTINENT MEDICATIONS:  Outpatient Encounter Medications as of 07/03/2021  Medication Sig   acetaminophen (TYLENOL) 325 MG tablet Take 650 mg by mouth every 4 (four) hours as needed for mild pain.   Cholecalciferol 1.25 MG (50000 UT) TABS Take 50,000 Units by mouth every Monday.   clopidogrel (PLAVIX) 75 MG tablet Take 75 mg by mouth daily.   denosumab (PROLIA) 60 MG/ML SOSY injection Inject 60 mg into the skin every 6 (six) months.   desvenlafaxine (PRISTIQ) 100 MG 24 hr tablet Take 100 mg by mouth daily.   fentaNYL (DURAGESIC - DOSED MCG/HR) 25 MCG/HR patch Apply one patch every 72 hours for pain. Remove old patch. External Use only. Rotate sites. Check placement of patch every shift (Patient taking differently: Place 1 patch onto the skin every 3 (three) days.)   gabapentin (NEURONTIN) 100 MG capsule Take 100 mg by mouth at bedtime.   HYDROcodone-acetaminophen (NORCO/VICODIN) 5-325 MG tablet Take 1 tablet by mouth 2 (two) times daily as needed for moderate pain.   levothyroxine (SYNTHROID, LEVOTHROID) 25 MCG tablet Take 25 mcg by mouth daily before breakfast.   loperamide (IMODIUM) 2 MG capsule Take 2-4 mg by mouth See admin instructions. 4mg  after first loose stool then 2mg  after each loose stool.   magnesium oxide (MAG-OX) 400 MG tablet Take 400 mg by mouth daily.   mirtazapine (REMERON) 45 MG tablet Take 45 mg by mouth at bedtime.    polyethylene glycol powder (GLYCOLAX/MIRALAX) powder Take 17 g by mouth daily.   predniSONE (DELTASONE) 1 MG tablet Take 1 tablet (1 mg total) by mouth daily.   saccharomyces boulardii (FLORASTOR) 250 MG capsule Take 250 mg by mouth 2 (two) times daily. For 10 days   senna (SENOKOT) 8.6 MG TABS tablet Take 1 tablet by mouth at bedtime.   traZODone (DESYREL) 50 MG tablet Take 12.5 mg by mouth daily.   No facility-administered encounter medications on file as of 07/03/2021.   Thank you for the opportunity to participate in the care of Ms. Lull.  The palliative care team will continue to follow. Please call our office at [phone] if we can be of additional assistance.   Ezekiel Slocumb, NP   COVID-19 PATIENT SCREENING TOOL Asked and negative response unless otherwise noted:  Have you had symptoms of covid, tested positive or been in contact with someone with symptoms/positive test in the past 5-10 days? No

## 2021-07-05 LAB — CULTURE, BLOOD (ROUTINE X 2)
Culture: NO GROWTH
Culture: NO GROWTH

## 2021-08-16 DEATH — deceased
